# Patient Record
Sex: Male | Born: 1945
Health system: Southern US, Community
[De-identification: ages and names within clinical notes are randomized; demographics above are authoritative.]

## PROBLEM LIST (undated history)

## (undated) DIAGNOSIS — K219 Gastro-esophageal reflux disease without esophagitis: Secondary | ICD-10-CM

## (undated) DIAGNOSIS — Z972 Presence of dental prosthetic device (complete) (partial): Secondary | ICD-10-CM

## (undated) DIAGNOSIS — M109 Gout, unspecified: Secondary | ICD-10-CM

## (undated) DIAGNOSIS — G8929 Other chronic pain: Secondary | ICD-10-CM

## (undated) DIAGNOSIS — R519 Headache, unspecified: Secondary | ICD-10-CM

## (undated) DIAGNOSIS — E039 Hypothyroidism, unspecified: Secondary | ICD-10-CM

## (undated) DIAGNOSIS — E785 Hyperlipidemia, unspecified: Secondary | ICD-10-CM

## (undated) DIAGNOSIS — Z973 Presence of spectacles and contact lenses: Secondary | ICD-10-CM

## (undated) DIAGNOSIS — R319 Hematuria, unspecified: Secondary | ICD-10-CM

## (undated) DIAGNOSIS — R7303 Prediabetes: Secondary | ICD-10-CM

## (undated) DIAGNOSIS — K2970 Gastritis, unspecified, without bleeding: Secondary | ICD-10-CM

## (undated) DIAGNOSIS — C679 Malignant neoplasm of bladder, unspecified: Secondary | ICD-10-CM

## (undated) DIAGNOSIS — E559 Vitamin D deficiency, unspecified: Secondary | ICD-10-CM

## (undated) DIAGNOSIS — M1990 Unspecified osteoarthritis, unspecified site: Secondary | ICD-10-CM

## (undated) HISTORY — PX: OTHER SURGICAL HISTORY: SHX169

## (undated) HISTORY — PX: TONSILLECTOMY: SUR1361

---

## 2006-02-16 ENCOUNTER — Ambulatory Visit: Payer: Self-pay | Admitting: Nurse Practitioner

## 2006-02-19 ENCOUNTER — Ambulatory Visit: Payer: Self-pay | Admitting: *Deleted

## 2006-03-01 ENCOUNTER — Ambulatory Visit: Payer: Self-pay | Admitting: Nurse Practitioner

## 2006-10-19 ENCOUNTER — Ambulatory Visit: Payer: Self-pay | Admitting: Nurse Practitioner

## 2007-05-22 ENCOUNTER — Encounter (INDEPENDENT_AMBULATORY_CARE_PROVIDER_SITE_OTHER): Payer: Self-pay | Admitting: *Deleted

## 2007-08-13 ENCOUNTER — Ambulatory Visit: Payer: Self-pay | Admitting: Nurse Practitioner

## 2007-08-13 LAB — CONVERTED CEMR LAB
ALT: 32 units/L (ref 0–53)
AST: 29 units/L (ref 0–37)
Albumin: 4.8 g/dL (ref 3.5–5.2)
Alkaline Phosphatase: 61 units/L (ref 39–117)
BUN: 13 mg/dL (ref 6–23)
Basophils Absolute: 0 10*3/uL (ref 0.0–0.1)
Basophils Relative: 0 % (ref 0–1)
CO2: 22 meq/L (ref 19–32)
Calcium: 9.5 mg/dL (ref 8.4–10.5)
Chloride: 100 meq/L (ref 96–112)
Creatinine, Ser: 0.98 mg/dL (ref 0.40–1.50)
Eosinophils Absolute: 0.1 10*3/uL — ABNORMAL LOW (ref 0.2–0.7)
Eosinophils Relative: 1 % (ref 0–5)
Glucose, Bld: 97 mg/dL (ref 70–99)
HCT: 40.1 % (ref 39.0–52.0)
Hemoglobin: 13.9 g/dL (ref 13.0–17.0)
Lymphocytes Relative: 30 % (ref 12–46)
Lymphs Abs: 1.7 10*3/uL (ref 0.7–4.0)
MCHC: 34.7 g/dL (ref 30.0–36.0)
MCV: 95.2 fL (ref 78.0–100.0)
Monocytes Absolute: 0.9 10*3/uL (ref 0.1–1.0)
Monocytes Relative: 17 % — ABNORMAL HIGH (ref 3–12)
Neutro Abs: 2.9 10*3/uL (ref 1.7–7.7)
Neutrophils Relative %: 52 % (ref 43–77)
Platelets: 263 10*3/uL (ref 150–400)
Potassium: 4 meq/L (ref 3.5–5.3)
RBC: 4.21 M/uL — ABNORMAL LOW (ref 4.22–5.81)
RDW: 13 % (ref 11.5–15.5)
Sodium: 137 meq/L (ref 135–145)
TSH: 7.334 microintl units/mL — ABNORMAL HIGH (ref 0.350–5.50)
Total Bilirubin: 0.8 mg/dL (ref 0.3–1.2)
Total Protein: 7.7 g/dL (ref 6.0–8.3)
WBC: 5.6 10*3/uL (ref 4.0–10.5)

## 2007-11-12 ENCOUNTER — Ambulatory Visit: Payer: Self-pay | Admitting: Internal Medicine

## 2008-07-13 ENCOUNTER — Ambulatory Visit (HOSPITAL_COMMUNITY): Admission: RE | Admit: 2008-07-13 | Discharge: 2008-07-13 | Payer: Self-pay | Admitting: Internal Medicine

## 2008-07-13 ENCOUNTER — Encounter (INDEPENDENT_AMBULATORY_CARE_PROVIDER_SITE_OTHER): Payer: Self-pay | Admitting: Internal Medicine

## 2008-07-13 ENCOUNTER — Ambulatory Visit: Payer: Self-pay | Admitting: Internal Medicine

## 2008-07-13 LAB — CONVERTED CEMR LAB
ALT: 56 units/L — ABNORMAL HIGH (ref 0–53)
AST: 38 units/L — ABNORMAL HIGH (ref 0–37)
Albumin: 4.6 g/dL (ref 3.5–5.2)
Alkaline Phosphatase: 66 units/L (ref 39–117)
BUN: 19 mg/dL (ref 6–23)
Basophils Absolute: 0 10*3/uL (ref 0.0–0.1)
Basophils Relative: 0 % (ref 0–1)
CO2: 23 meq/L (ref 19–32)
Calcium: 9.5 mg/dL (ref 8.4–10.5)
Chloride: 100 meq/L (ref 96–112)
Cholesterol: 247 mg/dL — ABNORMAL HIGH (ref 0–200)
Creatinine, Ser: 0.98 mg/dL (ref 0.40–1.50)
Eosinophils Absolute: 0.2 10*3/uL (ref 0.0–0.7)
Eosinophils Relative: 2 % (ref 0–5)
Glucose, Bld: 93 mg/dL (ref 70–99)
HCT: 45.1 % (ref 39.0–52.0)
HDL: 42 mg/dL (ref >39)
Hemoglobin: 14.9 g/dL (ref 13.0–17.0)
LDL Cholesterol: 181 mg/dL — ABNORMAL HIGH (ref 0–99)
Lymphocytes Relative: 25 % (ref 12–46)
Lymphs Abs: 1.6 10*3/uL (ref 0.7–4.0)
MCHC: 33 g/dL (ref 30.0–36.0)
MCV: 93.2 fL (ref 78.0–100.0)
Monocytes Absolute: 1 10*3/uL (ref 0.1–1.0)
Monocytes Relative: 15 % — ABNORMAL HIGH (ref 3–12)
Neutro Abs: 3.9 10*3/uL (ref 1.7–7.7)
Neutrophils Relative %: 58 % (ref 43–77)
PSA: 0.87 ng/mL (ref 0.10–4.00)
Platelets: 314 10*3/uL (ref 150–400)
Potassium: 4.7 meq/L (ref 3.5–5.3)
RBC: 4.84 M/uL (ref 4.22–5.81)
RDW: 14 % (ref 11.5–15.5)
Sodium: 135 meq/L (ref 135–145)
Total Bilirubin: 0.5 mg/dL (ref 0.3–1.2)
Total CHOL/HDL Ratio: 5.9
Total Protein: 7.7 g/dL (ref 6.0–8.3)
Triglycerides: 119 mg/dL (ref <150)
VLDL: 24 mg/dL (ref 0–40)
WBC: 6.6 10*3/uL (ref 4.0–10.5)

## 2009-02-20 ENCOUNTER — Ambulatory Visit: Payer: Self-pay | Admitting: Family Medicine

## 2009-02-20 ENCOUNTER — Inpatient Hospital Stay (HOSPITAL_COMMUNITY): Admission: EM | Admit: 2009-02-20 | Discharge: 2009-02-21 | Payer: Self-pay | Admitting: Emergency Medicine

## 2009-02-22 ENCOUNTER — Ambulatory Visit: Payer: Self-pay | Admitting: Internal Medicine

## 2009-03-01 ENCOUNTER — Ambulatory Visit: Payer: Self-pay | Admitting: Internal Medicine

## 2009-03-01 ENCOUNTER — Ambulatory Visit: Payer: Self-pay | Admitting: Family Medicine

## 2009-03-15 ENCOUNTER — Telehealth (INDEPENDENT_AMBULATORY_CARE_PROVIDER_SITE_OTHER): Payer: Self-pay | Admitting: Radiology

## 2009-03-16 ENCOUNTER — Ambulatory Visit: Payer: Self-pay

## 2009-03-16 ENCOUNTER — Encounter: Payer: Self-pay | Admitting: Cardiovascular Disease

## 2009-04-07 ENCOUNTER — Encounter (INDEPENDENT_AMBULATORY_CARE_PROVIDER_SITE_OTHER): Payer: Self-pay | Admitting: Internal Medicine

## 2009-04-07 ENCOUNTER — Ambulatory Visit: Payer: Self-pay | Admitting: Family Medicine

## 2009-04-07 LAB — CONVERTED CEMR LAB
ALT: 60 units/L — ABNORMAL HIGH (ref 0–53)
AST: 26 units/L (ref 0–37)
Albumin: 4.5 g/dL (ref 3.5–5.2)
Alkaline Phosphatase: 78 units/L (ref 39–117)
Bilirubin, Direct: 0.1 mg/dL (ref 0.0–0.3)
Indirect Bilirubin: 0.3 mg/dL (ref 0.0–0.9)
Total Bilirubin: 0.4 mg/dL (ref 0.3–1.2)
Total Protein: 7.3 g/dL (ref 6.0–8.3)

## 2009-04-08 ENCOUNTER — Encounter (INDEPENDENT_AMBULATORY_CARE_PROVIDER_SITE_OTHER): Payer: Self-pay | Admitting: Internal Medicine

## 2009-04-08 LAB — CONVERTED CEMR LAB
HCV Ab: NEGATIVE
Hep A Total Ab: NEGATIVE
Hep B Core Total Ab: NEGATIVE
Hep B E Ab: NEGATIVE
Hep B S Ab: NEGATIVE
Hepatitis B Surface Ag: NEGATIVE

## 2009-04-15 ENCOUNTER — Ambulatory Visit: Payer: Self-pay | Admitting: Internal Medicine

## 2010-10-04 NOTE — Progress Notes (Signed)
Summary: Nuclear Pre-Procedure   Phone Note Outgoing Call Call back at Home Phone [phone]   Call placed by: Harlow Asa, CNMT,  March 15, 2009 5:28 PM Summary of Call: Reviewed information on Myoview Information Sheet (see scanned document for further details).  Spoke with patient     Nuclear Med Background Indications for Stress Test: Evaluation for Ischemia, Post Hospital  Indications Comments: 02-20-09 -CP (- enzymes)    Symptoms: Chest Pain, SOB  Symptoms Comments: shoulder tightness   Nuclear Pre-Procedure Cardiac Risk Factors: Family History - CAD

## 2010-10-04 NOTE — Assessment & Plan Note (Signed)
Summary: Cardiology Nuclear Study  Nuclear Med Background Indications for Stress Test: Evaluation for Ischemia, Post Hospital  Indications Comments: 02-20-09 -CP (- enzymes)    Symptoms: Chest Pain, Dizziness, DOE, Light-Headedness, Palpitations, SOB  Symptoms Comments: shoulder tightness   Nuclear Pre-Procedure Cardiac Risk Factors: Family History - CAD Caffeine/Decaff Intake: none NPO After: 6:30 AM Lungs: clear IV 0.9% NS with Angio Cath: 20g     IV Site: (R) AC IV Started by: Irean Hong RN Chest Size (in) 42     Height (in): 73 Weight (lb): 186 BMI: 24.63 Tech Comments: The patient complained of feeling lightheaded after IV stick.The patient was  placed in trendelenburg position,BP 90/60, HR54, O2 sat 96% RA.BP 70/50, HR56 after patient to sitting position with 0.9% NACL 250cc infused.The patient symptoms subsided. BP 94/70 HR 61 O2 sat 97%.  Nuclear Med Study 1 or 2 day study:  1 day     Stress Test Type:  Stress Reading MD:  Charlton Haws, MD     Referring MD:  kelly vollmer Resting Radionuclide:  Technetium 12m Tetrofosmin     Resting Radionuclide Dose:  10 mCi  Stress Radionuclide:  Technetium 4m Tetrofosmin     Stress Radionuclide Dose:  33 mCi   Stress Protocol Exercise Time (min):  7:00 min     Max HR:  139 bpm     Predicted Max HR: 159 bpm  Max Systolic BP: 133 mm Hg     % Max HR:  87 %     METS: 8.50 Rate Pressure Product:  64403    Stress Test Technologist:  Milana Na EMT-P     Nuclear Technologist:  Domenic Polite CNMT  Rest Procedure  Myocardial perfusion imaging was performed at rest 45 minutes following the intraveneous administration of Myoview Technetium 10m Tetrofosmin.  Stress Procedure  The patient exercised for 7:00  The patient stopped due to fatigue and denied any chest pain.  There were no significant ST-T wave changes.  Myoview was injected at peak exercise and myocardial perfusion imaging was performed after a brief delay.  QPS Raw  Data Images:  Normal; no motion artifact; normal heart/lung ratio. Stress Images:  NI: Uniform and normal uptake of tracer in all myocardial segments. Rest Images:  Normal homogeneous uptake in all areas of the myocardium. Subtraction (SDS):  Normal Transient Ischemic Dilatation:  1.05  (Normal <1.22)  Lung/Heart Ratio:  .25  (Normal <0.45)  Quantitative Gated Spect Images QGS EDV:  133 ml QGS ESV:  58 ml QGS EF:  57 % QGS cine images:  Normal  Findings Normal nuclear study      Overall Impression  Exercise Capacity: Fair exercise capacity. BP Response: Normal blood pressure response. Clinical Symptoms: No chest pain ECG Impression: No significant ST segment change suggestive of ischemia. Overall Impression: Normal stress nuclear study. Overall Impression Comments: Normal

## 2010-10-04 NOTE — Miscellaneous (Signed)
Summary: VIP  Patient: Lance Friedman Note: All result statuses are Final unless otherwise noted.  Tests: (1) VIP (Medications)   LLIMPORTMEDS              "Result Below..."       RESULT: PROTONIX TBEC 40 MG*TAKE ONE (1) TABLET BY MOUTH EVERY DAY  GENE OVER-INCOME 07/07*10/31/2006*Last Refill: 04/12/2007*[id number]*******   LLIMPORTMEDS              "Result Below..."       RESULT: CELEXA TABS 20 MG*TAKE TWO (2) TABLETS BY MOUTH DAILY   GENE OVER-INCOME 08/07*02/05/2007*Last Refill: 04/12/2007*[id number]*******   LLIMPORTALLS              NKDA***  Note: An exclamation mark (!) indicates a result that was not dispersed into the flowsheet. Document Creation Date: 07/04/2007 3:04 PM _______________________________________________________________________  (1) Order result status: Final Collection or observation date-time: 05/22/2007 Requested date-time: 05/22/2007 Receipt date-time:  Reported date-time: 05/22/2007 Referring Physician:   Ordering Physician:   Specimen Source:  Source: [facility] [place] Order Number:  Lab site:

## 2010-10-19 ENCOUNTER — Encounter (INDEPENDENT_AMBULATORY_CARE_PROVIDER_SITE_OTHER): Payer: Self-pay | Admitting: *Deleted

## 2010-10-19 LAB — CONVERTED CEMR LAB
ALT: 19 units/L (ref 0–53)
AST: 20 units/L (ref 0–37)
Albumin: 4.7 g/dL (ref 3.5–5.2)
Alkaline Phosphatase: 54 units/L (ref 39–117)
BUN: 14 mg/dL (ref 6–23)
CO2: 29 meq/L (ref 19–32)
Calcium: 9.7 mg/dL (ref 8.4–10.5)
Chloride: 104 meq/L (ref 96–112)
Cholesterol: 234 mg/dL — ABNORMAL HIGH (ref 0–200)
Creatinine, Ser: 0.92 mg/dL (ref 0.40–1.50)
Glucose, Bld: 94 mg/dL (ref 70–99)
HDL: 43 mg/dL (ref >39)
LDL Cholesterol: 176 mg/dL — ABNORMAL HIGH (ref 0–99)
PSA: 1.02 ng/mL (ref <=4.00)
Potassium: 5 meq/L (ref 3.5–5.3)
Sodium: 142 meq/L (ref 135–145)
Total Bilirubin: 0.5 mg/dL (ref 0.3–1.2)
Total CHOL/HDL Ratio: 5.4
Total Protein: 7 g/dL (ref 6.0–8.3)
Triglycerides: 77 mg/dL (ref <150)
VLDL: 15 mg/dL (ref 0–40)

## 2010-12-08 ENCOUNTER — Emergency Department (HOSPITAL_COMMUNITY)
Admission: EM | Admit: 2010-12-08 | Discharge: 2010-12-08 | Disposition: A | Discharge status: Home / Self Care | Payer: No Typology Code available for payment source | Attending: Emergency Medicine | Admitting: Emergency Medicine

## 2010-12-08 DIAGNOSIS — K219 Gastro-esophageal reflux disease without esophagitis: Secondary | ICD-10-CM | POA: Insufficient documentation

## 2010-12-08 DIAGNOSIS — R079 Chest pain, unspecified: Secondary | ICD-10-CM | POA: Insufficient documentation

## 2010-12-08 DIAGNOSIS — E7800 Pure hypercholesterolemia, unspecified: Secondary | ICD-10-CM | POA: Insufficient documentation

## 2010-12-08 DIAGNOSIS — IMO0002 Reserved for concepts with insufficient information to code with codable children: Secondary | ICD-10-CM | POA: Insufficient documentation

## 2010-12-12 LAB — CK TOTAL AND CKMB (NOT AT ARMC)
CK, MB: 1.7 ng/mL (ref 0.3–4.0)
Relative Index: 1.3 (ref 0.0–2.5)
Total CK: 134 U/L (ref 7–232)

## 2010-12-12 LAB — CARDIAC PANEL(CRET KIN+CKTOT+MB+TROPI)
CK, MB: 1 ng/mL (ref 0.3–4.0)
CK, MB: 1.3 ng/mL (ref 0.3–4.0)
CK, MB: 1.4 ng/mL (ref 0.3–4.0)
Relative Index: INVALID (ref 0.0–2.5)
Relative Index: INVALID (ref 0.0–2.5)
Relative Index: INVALID (ref 0.0–2.5)
Total CK: 85 U/L (ref 7–232)
Total CK: 90 U/L (ref 7–232)
Total CK: 92 U/L (ref 7–232)
Troponin I: 0.01 ng/mL (ref 0.00–0.06)
Troponin I: 0.01 ng/mL (ref 0.00–0.06)
Troponin I: 0.02 ng/mL (ref 0.00–0.06)

## 2010-12-12 LAB — COMPREHENSIVE METABOLIC PANEL
ALT: 71 U/L — ABNORMAL HIGH (ref 0–53)
AST: 106 U/L — ABNORMAL HIGH (ref 0–37)
Albumin: 3.7 g/dL (ref 3.5–5.2)
Alkaline Phosphatase: 73 U/L (ref 39–117)
BUN: 14 mg/dL (ref 6–23)
CO2: 25 mEq/L (ref 19–32)
Calcium: 9 mg/dL (ref 8.4–10.5)
Chloride: 102 mEq/L (ref 96–112)
Creatinine, Ser: 0.88 mg/dL (ref 0.4–1.5)
GFR calc Af Amer: >60 mL/min (ref >60)
GFR calc non Af Amer: >60 mL/min (ref >60)
Glucose, Bld: 130 mg/dL — ABNORMAL HIGH (ref 70–99)
Potassium: 3.9 mEq/L (ref 3.5–5.1)
Sodium: 135 mEq/L (ref 135–145)
Total Bilirubin: 0.4 mg/dL (ref 0.3–1.2)
Total Protein: 6.3 g/dL (ref 6.0–8.3)

## 2010-12-12 LAB — CBC
HCT: 37 % — ABNORMAL LOW (ref 39.0–52.0)
HCT: 38.8 % — ABNORMAL LOW (ref 39.0–52.0)
Hemoglobin: 12.5 g/dL — ABNORMAL LOW (ref 13.0–17.0)
Hemoglobin: 13 g/dL (ref 13.0–17.0)
MCHC: 33.5 g/dL (ref 30.0–36.0)
MCHC: 33.8 g/dL (ref 30.0–36.0)
MCV: 94.4 fL (ref 78.0–100.0)
MCV: 94.4 fL (ref 78.0–100.0)
Platelets: 239 10*3/uL (ref 150–400)
Platelets: 250 10*3/uL (ref 150–400)
RBC: 3.92 MIL/uL — ABNORMAL LOW (ref 4.22–5.81)
RBC: 4.11 MIL/uL — ABNORMAL LOW (ref 4.22–5.81)
RDW: 13.1 % (ref 11.5–15.5)
RDW: 13.6 % (ref 11.5–15.5)
WBC: 15.3 10*3/uL — ABNORMAL HIGH (ref 4.0–10.5)
WBC: 5.5 10*3/uL (ref 4.0–10.5)

## 2010-12-12 LAB — BASIC METABOLIC PANEL
BUN: 10 mg/dL (ref 6–23)
CO2: 29 mEq/L (ref 19–32)
Calcium: 8.7 mg/dL (ref 8.4–10.5)
Chloride: 101 mEq/L (ref 96–112)
Creatinine, Ser: 0.87 mg/dL (ref 0.4–1.5)
GFR calc Af Amer: >60 mL/min (ref >60)
GFR calc non Af Amer: >60 mL/min (ref >60)
Glucose, Bld: 99 mg/dL (ref 70–99)
Potassium: 4.1 mEq/L (ref 3.5–5.1)
Sodium: 135 mEq/L (ref 135–145)

## 2010-12-12 LAB — POCT CARDIAC MARKERS
CKMB, poc: <1 ng/mL — ABNORMAL LOW (ref 1.0–8.0)
CKMB, poc: <1 ng/mL — ABNORMAL LOW (ref 1.0–8.0)
Myoglobin, poc: 40.3 ng/mL (ref 12–200)
Myoglobin, poc: 48.3 ng/mL (ref 12–200)
Troponin i, poc: <0.05 ng/mL (ref 0.00–0.09)
Troponin i, poc: <0.05 ng/mL (ref 0.00–0.09)

## 2010-12-12 LAB — DIFFERENTIAL
Basophils Absolute: 0 10*3/uL (ref 0.0–0.1)
Basophils Relative: 0 % (ref 0–1)
Eosinophils Absolute: 0.1 10*3/uL (ref 0.0–0.7)
Eosinophils Relative: 1 % (ref 0–5)
Lymphocytes Relative: 6 % — ABNORMAL LOW (ref 12–46)
Lymphs Abs: 0.9 10*3/uL (ref 0.7–4.0)
Monocytes Absolute: 1.6 10*3/uL — ABNORMAL HIGH (ref 0.1–1.0)
Monocytes Relative: 10 % (ref 3–12)
Neutro Abs: 12.7 10*3/uL — ABNORMAL HIGH (ref 1.7–7.7)
Neutrophils Relative %: 83 % — ABNORMAL HIGH (ref 43–77)

## 2010-12-12 LAB — URINALYSIS, ROUTINE W REFLEX MICROSCOPIC
Bilirubin Urine: NEGATIVE
Glucose, UA: NEGATIVE mg/dL
Hgb urine dipstick: NEGATIVE
Ketones, ur: NEGATIVE mg/dL
Nitrite: NEGATIVE
Protein, ur: NEGATIVE mg/dL
Specific Gravity, Urine: 1.019 (ref 1.005–1.030)
Urobilinogen, UA: 0.2 mg/dL (ref 0.0–1.0)
pH: 6.5 (ref 5.0–8.0)

## 2010-12-12 LAB — RAPID URINE DRUG SCREEN, HOSP PERFORMED
Amphetamines: NOT DETECTED
Barbiturates: NOT DETECTED
Benzodiazepines: NOT DETECTED
Cocaine: NOT DETECTED
Opiates: NOT DETECTED
Tetrahydrocannabinol: NOT DETECTED

## 2010-12-12 LAB — LIPID PANEL
Cholesterol: 188 mg/dL (ref 0–200)
HDL: 33 mg/dL — ABNORMAL LOW (ref >39)
LDL Cholesterol: 138 mg/dL — ABNORMAL HIGH (ref 0–99)
Total CHOL/HDL Ratio: 5.7 RATIO
Triglycerides: 85 mg/dL (ref <150)
VLDL: 17 mg/dL (ref 0–40)

## 2010-12-12 LAB — LIPASE, BLOOD: Lipase: 35 U/L (ref 11–59)

## 2010-12-12 LAB — TROPONIN I: Troponin I: 0.01 ng/mL (ref 0.00–0.06)

## 2010-12-21 ENCOUNTER — Emergency Department (HOSPITAL_COMMUNITY)
Admission: EM | Admit: 2010-12-21 | Discharge: 2010-12-21 | Disposition: A | Discharge status: Home / Self Care | Payer: No Typology Code available for payment source | Attending: Emergency Medicine | Admitting: Emergency Medicine

## 2010-12-21 ENCOUNTER — Emergency Department (HOSPITAL_COMMUNITY): Payer: No Typology Code available for payment source

## 2010-12-21 DIAGNOSIS — F329 Major depressive disorder, single episode, unspecified: Secondary | ICD-10-CM | POA: Insufficient documentation

## 2010-12-21 DIAGNOSIS — R1011 Right upper quadrant pain: Secondary | ICD-10-CM | POA: Insufficient documentation

## 2010-12-21 DIAGNOSIS — R109 Unspecified abdominal pain: Secondary | ICD-10-CM | POA: Insufficient documentation

## 2010-12-21 DIAGNOSIS — F3289 Other specified depressive episodes: Secondary | ICD-10-CM | POA: Insufficient documentation

## 2010-12-21 DIAGNOSIS — K219 Gastro-esophageal reflux disease without esophagitis: Secondary | ICD-10-CM | POA: Insufficient documentation

## 2010-12-21 LAB — POCT I-STAT, CHEM 8
BUN: 15 mg/dL (ref 6–23)
Calcium, Ion: 1.23 mmol/L (ref 1.12–1.32)
Chloride: 103 mEq/L (ref 96–112)
Creatinine, Ser: 1 mg/dL (ref 0.4–1.5)
Glucose, Bld: 98 mg/dL (ref 70–99)
HCT: 43 % (ref 39.0–52.0)
Hemoglobin: 14.6 g/dL (ref 13.0–17.0)
Potassium: 4.1 mEq/L (ref 3.5–5.1)
Sodium: 141 mEq/L (ref 135–145)
TCO2: 30 mmol/L (ref 0–100)

## 2010-12-21 MED ORDER — IOHEXOL 300 MG/ML  SOLN
100.0000 mL | Freq: Once | INTRAMUSCULAR | Status: AC | PRN
  Administered 2010-12-21: 100 mL via INTRAVENOUS
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
## 2011-01-17 NOTE — Discharge Summary (Signed)
NAMECANNEN, DUPRAS               ACCOUNT NO.:  [id number]   MEDICAL RECORD NO.:  [id number]          PATIENT TYPE:  INP   LOCATION:  2020                         FACILITY:  [facility]   PHYSICIAN:  Nestor Ramp, MD        DATE OF BIRTH:  September 28, 1945   DATE OF ADMISSION:  02/20/2009  DATE OF DISCHARGE:  02/21/2009                               DISCHARGE SUMMARY   PRIMARY CARE Satya Buttram:  [facility].   DISCHARGE DIAGNOSES:  1. Chest pain.  2. Bradycardia.  3. History of alcohol use abuse.  4. Depression.  5. Anemia.  6. Gastroesophageal reflux disease.   DISCHARGE MEDICATIONS:  1. Protonix 40 mg by mouth daily.  2. Celexa 40 mg by mouth daily.  3. Naproxen 500 mg by mouth twice daily to take with food.  4. Aspirin 81 mg daily.   IMAGING:  Chest x-ray on February 20, 2009.  Impression:  Cephalization in  vascular pedicles in the lungs raising the possibility of pulmonary  venous hypertension.  No overt edema or cardiomegaly.   LABORATORY DATA:  Cardiac enzymes negative x3.  BMP within normal  limits.  Hemoglobin 12.5 and hematocrit 37.0.  Cholesterol 188,  triglycerides 85, HDL 33, and LDL 138.  Urine drug screen negative.  Lipase 35, AST 106, ALT 71, and alk phos 73.   BRIEF HOSPITAL COURSE:  Mr. Highley is a 65 year old male with a past  medical history significant for impression was admitted for chest pain  rule out.  Please see H and P for further details.  1. Chest pain.  The patient was admitted with atypical chest pain for      acute coronary syndrome, rule out.  He was monitored on telemetry      overnight.  He did have negative cardiac markers x3 and an EKG that      showed sinus bradycardia.  He had no more chest pain during his      stay.  He was started on aspirin 81 mg daily.  Given the fact that      the patient had a low risk of having an MI as well as being      slightly hypotensive and bradycardic, it was decided that no beta-      blocker will be started on this  patient.  He was risk stratified.      Please see lipid panel above.  It was thought that the patient's      symptoms were likely GI versus anxiety related.  He was continued      on his current GI medication of Protonix and his current depression      medication of Celexa.  2. As per cardiac, the patient was found to sinus bradycardia      consistently on EKG.  This was asymptomatic and we will recommend      workup on an outpatient basis for this.  His heart rate did remain      in the range of 50s-60s.  3. Alcohol abuse.  The patient did attend his alcohol anonymous  meetings while in the hospital.  His continued abstinence from      alcohol was encouraged.  4. Depression.  The patient's home medication of Celexa was continued.  5. Anemia.  The patient was found to be slightly anemic.  He denied      any melena, hematochezia, or hematemesis.  It was suggested to be      worked up on an outpatient basis.   FOLLOWUP:  The patient was instructed to follow up with his primary care  Illyana [facility]orsch at [facility] in 1-2 weeks.  Followup issues include:  1. Resolution for continuance of atypical chest pain.  2. Further risk stratification and starting of medication for      hyperlipidemia.  3. Further workup for anemia is indicated.      Helane Rima, MD  Electronically Signed      Nestor Ramp, MD  Electronically Signed    EW/MEDQ  D:  02/28/2009  T:  03/01/2009  Job:  [id number]

## 2012-08-06 ENCOUNTER — Emergency Department (HOSPITAL_COMMUNITY)
Admission: EM | Admit: 2012-08-06 | Discharge: 2012-08-06 | Disposition: A | Discharge status: Home / Self Care | Payer: PRIVATE HEALTH INSURANCE | Source: Home / Self Care

## 2012-08-06 ENCOUNTER — Encounter (HOSPITAL_COMMUNITY): Payer: Self-pay | Admitting: Emergency Medicine

## 2012-08-06 DIAGNOSIS — J209 Acute bronchitis, unspecified: Secondary | ICD-10-CM

## 2012-08-06 HISTORY — DX: Gastro-esophageal reflux disease without esophagitis: K21.9

## 2012-08-06 MED ORDER — GUAIFENESIN-DM 100-10 MG/5ML PO SYRP: 5.0000 mL | ORAL_SOLUTION | ORAL | Status: DC | PRN

## 2012-08-06 MED ORDER — AMOXICILLIN 500 MG PO CAPS: 500.0000 mg | ORAL_CAPSULE | Freq: Three times a day (TID) | ORAL | Status: DC

## 2012-08-06 NOTE — ED Notes (Signed)
Reports coughing for two weeks.  Has tried OTC medication but no relief.

## 2012-08-06 NOTE — ED Provider Notes (Signed)
History     CSN: [id number]  Arrival date & time 08/06/12  1247   None     Chief Complaint  Patient presents with  . Cough     HPI 66 year old male with history of GERD presenting with ongoing cough for past 2 weeks. The cough is associated with whitish phlegm. He has not noticed any blood in the sputum. He has tried over-the-counter medications including cough syrup without much relief. He denies any fevers or chills however has some chest discomfort with coughing. Denies any nausea or vomiting. Denies any nasal congestion sinus pain or sore throat. He denies any chest pain, palpitations, shortness of breath, wheezing, bowel or urinary symptoms. Denies any change in his weight or appetite. Denies any sick contacts. Denies any body aches.  Past Medical History  Diagnosis Date  . GERD (gastroesophageal reflux disease)     History reviewed. No pertinent past surgical history.  No family history on file.  History  Substance Use Topics  . Smoking status: Former Games developer  . Smokeless tobacco: Not on file  . Alcohol Use: No      Review of Systems Denies headache, blurry vision, nasal congestion, ear discharge. Denies sore throat, hoarseness of voice. Denies fever or chills. Denies difficulty swallowing. Persistent cough with whitish phlegm associated with some chest congestion. Denies chest pain, palpitations, shortness of breath, wheezing, Denies nausea, vomiting, abdominal pain, dysuria, bowel symptoms. Denies body aches, weakness in extremities or joints.  Allergies  Review of patient's allergies indicates no known allergies.  Home Medications   Current Outpatient Rx  Name  Route  Sig  Dispense  Refill  . OMEPRAZOLE 40 MG PO CPDR   Oral   Take 40 mg by mouth daily.         . AMOXICILLIN 500 MG PO CAPS   Oral   Take 1 capsule (500 mg total) by mouth 3 (three) times daily.   15 capsule   0     BP 117/87  Pulse 100  Temp 98.6 F (37 C) (Oral)  Resp 18  SpO2  100%  Physical Exam Elderly male in no acute distress. Coughing constantly during exam.  HEENT: No pallor, no icterus, moist oral mucosaNormal oropharynx Chest : clear to auscultation bilaterally no crackles wheezes or rhonchi CVS: Normal S1 and S2, no murmurs Abdomen: soft, nontender Extremities: Warm CNS: AAO x3  ED Course  Procedures (including critical care time)  Labs Reviewed - No data to display No results found.   1. Acute bronchitis    Follow give him a course of by mouth amoxicillin for 5 days. Encouraged on taking plenty of water and keeping warm. Patient does not have any fever or associated symptoms of pneumonia. I will prescribe him Robitussin for cough. Informed on returning back to the clinic or to the ED if symptoms do not improve in next 48-72 hours or get worse with associated fevers or chills.  #2 GERD Continue with omeprazole at home.  MDM          Eddie North, MD 08/06/12 1329

## 2012-08-13 ENCOUNTER — Emergency Department (HOSPITAL_COMMUNITY)
Admission: EM | Admit: 2012-08-13 | Discharge: 2012-08-13 | Disposition: A | Discharge status: Home / Self Care | Payer: PRIVATE HEALTH INSURANCE | Source: Home / Self Care

## 2012-08-13 ENCOUNTER — Encounter (HOSPITAL_COMMUNITY): Payer: Self-pay

## 2012-08-13 ENCOUNTER — Emergency Department (INDEPENDENT_AMBULATORY_CARE_PROVIDER_SITE_OTHER): Payer: PRIVATE HEALTH INSURANCE

## 2012-08-13 DIAGNOSIS — R9389 Abnormal findings on diagnostic imaging of other specified body structures: Secondary | ICD-10-CM

## 2012-08-13 DIAGNOSIS — R918 Other nonspecific abnormal finding of lung field: Secondary | ICD-10-CM

## 2012-08-13 DIAGNOSIS — R05 Cough: Secondary | ICD-10-CM

## 2012-08-13 DIAGNOSIS — J189 Pneumonia, unspecified organism: Secondary | ICD-10-CM

## 2012-08-13 DIAGNOSIS — R059 Cough, unspecified: Secondary | ICD-10-CM

## 2012-08-13 DIAGNOSIS — K219 Gastro-esophageal reflux disease without esophagitis: Secondary | ICD-10-CM

## 2012-08-13 MED ORDER — AMOXICILLIN-POT CLAVULANATE 875-125 MG PO TABS: 1.0000 | ORAL_TABLET | Freq: Two times a day (BID) | ORAL | Status: DC

## 2012-08-13 MED ORDER — ACIDOPHILUS PROBIOTIC 100 MG PO CAPS: 1.0000 | ORAL_CAPSULE | Freq: Three times a day (TID) | ORAL | Status: DC

## 2012-08-13 MED ORDER — IPRATROPIUM-ALBUTEROL 0.5-2.5 (3) MG/3ML IN SOLN
3.0000 mL | Freq: Once | RESPIRATORY_TRACT | Status: AC
  Administered 2012-08-13: 3 mL via RESPIRATORY_TRACT

## 2012-08-13 MED ORDER — ALBUTEROL SULFATE (5 MG/ML) 0.5% IN NEBU
INHALATION_SOLUTION | RESPIRATORY_TRACT | Status: AC
  Filled 2012-08-13: qty 1

## 2012-08-13 NOTE — ED Notes (Signed)
C/o cough congestion patient stated just finished antibiotic and symptoms have returned

## 2012-08-13 NOTE — ED Provider Notes (Signed)
History     CSN: [id number]  Arrival date & time 08/13/12  1050  Chief Complaint  Patient presents with  . Nasal Congestion   HPI Pt presents today to follow up.  He says that after he finished his course of antibiotics his symptoms of cough, and chills returned.  He is having no bloody sputum and minimal sputum production.  He is mostly concerned about waking up in the middle of the night drenched in sweat.  He says that he has had some SOB, but no CP.  No known fever but has had chills.  He has completed full 5 days of amoxicillin.    Past Medical History  Diagnosis Date  . GERD (gastroesophageal reflux disease)     History reviewed. No pertinent past surgical history.  No family history on file.  History  Substance Use Topics  . Smoking status: Former Games developer  . Smokeless tobacco: Not on file  . Alcohol Use: No    Review of Systems  Constitutional: Positive for chills, activity change, appetite change and fatigue. Negative for fever and unexpected weight change.  HENT: Positive for congestion, rhinorrhea and postnasal drip. Negative for sneezing.   Eyes: Negative.   Respiratory: Positive for cough and shortness of breath. Negative for apnea, choking, chest tightness, wheezing and stridor.   Cardiovascular: Negative.   Gastrointestinal: Negative.   Genitourinary: Negative.   Musculoskeletal: Negative.   Neurological: Positive for dizziness. Negative for facial asymmetry, weakness, light-headedness, numbness and headaches.  Psychiatric/Behavioral: Negative.     Allergies  Review of patient's allergies indicates no known allergies.  Home Medications   Current Outpatient Rx  Name  Route  Sig  Dispense  Refill  . AMOXICILLIN-POT CLAVULANATE 875-125 MG PO TABS   Oral   Take 1 tablet by mouth 2 (two) times daily.   14 tablet   0   . GUAIFENESIN-DM 100-10 MG/5ML PO SYRP   Oral   Take 5 mLs by mouth every 4 (four) hours as needed for cough.   118 mL   0   .  ACIDOPHILUS PROBIOTIC 100 MG PO CAPS   Oral   Take 1 capsule (100 mg total) by mouth 3 (three) times daily with meals.   90 capsule   0   . OMEPRAZOLE 40 MG PO CPDR   Oral   Take 40 mg by mouth daily.           BP 104/67  Pulse 94  Temp 98 F (36.7 C) (Oral)  Resp 19  SpO2 99%  Physical Exam  Constitutional: He is oriented to person, place, and time. He appears well-developed and well-nourished. No distress.  HENT:  Head: Normocephalic and atraumatic.  Eyes: EOM are normal. Pupils are equal, round, and reactive to light.  Neck: Normal range of motion. Neck supple.  Cardiovascular: Normal rate, regular rhythm and normal heart sounds.   Pulmonary/Chest: Effort normal and breath sounds normal.  Abdominal: Soft. Bowel sounds are normal.  Musculoskeletal: Normal range of motion. He exhibits no edema and no tenderness.  Neurological: He is alert and oriented to person, place, and time.  Skin: Skin is warm and dry.    ED Course  Procedures (including critical care time)  Labs Reviewed - No data to display No results found.  1. Abnormal CXR   2. Cough   3. Community acquired pneumonia   4. GERD (gastroesophageal reflux disease)    MDM  IMPRESSION  Cough  Abnormal Chest Xray  Community Acquired  Pneumonia  RECOMMENDATIONS / PLAN I STRONGLY ADVISED PATIENT TO RETURN TO CLINIC IN 1 MONTH FOR RECHECK CXR.  I REVIEWED THE RESULTS OF THE CXR WITH THE PATIENT.  HE VERBALIZED UNDERSTANDING. I wrote for him to start Augmentin bid, but patient returned and said that he could not afford the medication, then we switched the medication to amoxicillin 500mg  to take 2 tabs po bid for 7 days, I strongly advised him to RTC if no improvement in next 24-48 hours.  The patient verbalized understanding.  I asked him to RTC in 1 week for recheck.  Refilled omeprazole 40 mg daily.   Pt received Duoneb treatment in the clinic and improved after the treatment with better air movement and much  less SOB  FOLLOW UP 1 week for recheck follow up Pneumonia and abnormal CXR  The patient was given clear instructions to go to ER or return to medical center if symptoms don't improve, worsen or new problems develop.  The patient verbalized understanding.  The patient was told to call to get lab results if they haven't heard anything in the next week.            Cleora Fleet, MD 08/13/12 1511

## 2013-09-04 HISTORY — PX: MOUTH SURGERY: SHX715

## 2016-01-21 ENCOUNTER — Other Ambulatory Visit: Payer: Self-pay | Admitting: Gastroenterology

## 2016-03-20 ENCOUNTER — Encounter (HOSPITAL_COMMUNITY): Payer: Self-pay | Admitting: *Deleted

## 2016-03-21 ENCOUNTER — Encounter (HOSPITAL_COMMUNITY)
Admission: RE | Disposition: A | Discharge status: Home / Self Care | Payer: Self-pay | Source: Ambulatory Visit | Attending: Gastroenterology

## 2016-03-21 ENCOUNTER — Encounter (HOSPITAL_COMMUNITY): Payer: Self-pay | Admitting: *Deleted

## 2016-03-21 ENCOUNTER — Ambulatory Visit (HOSPITAL_COMMUNITY): Payer: Medicare Other | Admitting: Certified Registered Nurse Anesthetist

## 2016-03-21 ENCOUNTER — Ambulatory Visit (HOSPITAL_COMMUNITY)
Admission: RE | Admit: 2016-03-21 | Discharge: 2016-03-21 | Disposition: A | Discharge status: Home / Self Care | Payer: Medicare Other | Source: Ambulatory Visit | Attending: Gastroenterology | Admitting: Gastroenterology

## 2016-03-21 DIAGNOSIS — Z79899 Other long term (current) drug therapy: Secondary | ICD-10-CM | POA: Insufficient documentation

## 2016-03-21 DIAGNOSIS — Z87891 Personal history of nicotine dependence: Secondary | ICD-10-CM | POA: Insufficient documentation

## 2016-03-21 DIAGNOSIS — Z1211 Encounter for screening for malignant neoplasm of colon: Secondary | ICD-10-CM | POA: Insufficient documentation

## 2016-03-21 DIAGNOSIS — K219 Gastro-esophageal reflux disease without esophagitis: Secondary | ICD-10-CM | POA: Insufficient documentation

## 2016-03-21 DIAGNOSIS — J449 Chronic obstructive pulmonary disease, unspecified: Secondary | ICD-10-CM | POA: Diagnosis not present

## 2016-03-21 HISTORY — PX: COLONOSCOPY WITH PROPOFOL: SHX5780

## 2016-03-21 SURGERY — COLONOSCOPY WITH PROPOFOL
Anesthesia: Monitor Anesthesia Care

## 2016-03-21 MED ORDER — LIDOCAINE 2% (20 MG/ML) 5 ML SYRINGE
INTRAMUSCULAR | Status: DC | PRN
  Administered 2016-03-21: 50 mg via INTRAVENOUS

## 2016-03-21 MED ORDER — PROPOFOL 10 MG/ML IV BOLUS
INTRAVENOUS | Status: AC
  Filled 2016-03-21: qty 20

## 2016-03-21 MED ORDER — SODIUM CHLORIDE 0.9 % IV SOLN: INTRAVENOUS | Status: DC

## 2016-03-21 MED ORDER — ONDANSETRON HCL 4 MG/2ML IJ SOLN
INTRAMUSCULAR | Status: DC | PRN
  Administered 2016-03-21: 4 mg via INTRAVENOUS

## 2016-03-21 MED ORDER — PROPOFOL 10 MG/ML IV BOLUS
INTRAVENOUS | Status: AC
  Filled 2016-03-21: qty 40

## 2016-03-21 MED ORDER — LACTATED RINGERS IV SOLN
INTRAVENOUS | Status: DC
  Administered 2016-03-21: 09:00:00 via INTRAVENOUS

## 2016-03-21 MED ORDER — LIDOCAINE HCL (CARDIAC) 20 MG/ML IV SOLN
INTRAVENOUS | Status: AC
  Filled 2016-03-21: qty 5

## 2016-03-21 MED ORDER — PROPOFOL 500 MG/50ML IV EMUL
INTRAVENOUS | Status: DC | PRN
Start: 2016-03-21 — End: 2016-03-21
  Administered 2016-03-21: 75 ug/kg/min via INTRAVENOUS

## 2016-03-21 MED ORDER — ONDANSETRON HCL 4 MG/2ML IJ SOLN
INTRAMUSCULAR | Status: AC
  Filled 2016-03-21: qty 2

## 2016-03-21 SURGICAL SUPPLY — 21 items

## 2016-03-21 NOTE — Discharge Instructions (Signed)
Colonoscopy, Care After °Refer to this sheet in the next few weeks. These instructions provide you with information on caring for yourself after your procedure. Your health care provider may also give you more specific instructions. Your treatment has been planned according to current medical practices, but problems sometimes occur. Call your health care provider if you have any problems or questions after your procedure. °WHAT TO EXPECT AFTER THE PROCEDURE  °After your procedure, it is typical to have the following: °· A small amount of blood in your stool. °· Moderate amounts of gas and mild abdominal cramping or bloating. °HOME CARE INSTRUCTIONS °· Do not drive, operate machinery, or sign important documents for 24 hours. °· You may shower and resume your regular physical activities, but move at a slower pace for the first 24 hours. °· Take frequent rest periods for the first 24 hours. °· Walk around or put a warm pack on your abdomen to help reduce abdominal cramping and bloating. °· Drink enough fluids to keep your urine clear or pale yellow. °· You may resume your normal diet as instructed by your health care provider. Avoid heavy or fried foods that are hard to digest. °· Avoid drinking alcohol for 24 hours or as instructed by your health care provider. °· Only take over-the-counter or prescription medicines as directed by your health care provider. °· If a tissue sample (biopsy) was taken during your procedure: °¨ Do not take aspirin or blood thinners for 7 days, or as instructed by your health care provider. °¨ Do not drink alcohol for 7 days, or as instructed by your health care provider. °¨ Eat soft foods for the first 24 hours. °SEEK MEDICAL CARE IF: °You have persistent spotting of blood in your stool 2-3 days after the procedure. °SEEK IMMEDIATE MEDICAL CARE IF: °· You have more than a small spotting of blood in your stool. °· You pass large blood clots in your stool. °· Your abdomen is swollen  (distended). °· You have nausea or vomiting. °· You have a fever. °· You have increasing abdominal pain that is not relieved with medicine. °  °This information is not intended to replace advice given to you by your health care provider. Make sure you discuss any questions you have with your health care provider. °  °Document Released: 04/04/2004 Document Revised: 06/11/2013 Document Reviewed: 04/28/2013 °Elsevier Interactive Patient Education ©2016 [employer] ° °

## 2016-03-21 NOTE — Transfer of Care (Signed)
Immediate Anesthesia Transfer of Care Note  Patient: Lance Friedman  Procedure(s) Performed: Procedure(s): COLONOSCOPY WITH PROPOFOL (N/A)  Patient Location: PACU  Anesthesia Type:MAC  Level of Consciousness:  sedated, patient cooperative and responds to stimulation  Airway & Oxygen Therapy:Patient Spontanous Breathing and Patient connected to face mask oxgen  Post-op Assessment:  Report given to PACU RN and Post -op Vital signs reviewed and stable  Post vital signs:  Reviewed and stable  Last Vitals:  Filed Vitals:   03/21/16 0902  BP: 130/80  Pulse: 64  Temp: 36.7 C  Resp: 11    Complications: No apparent anesthesia complications

## 2016-03-21 NOTE — Anesthesia Postprocedure Evaluation (Signed)
Anesthesia Post Note  Patient: Lance Friedman  Procedure(s) Performed: Procedure(s) (LRB): COLONOSCOPY WITH PROPOFOL (N/A)  Patient location during evaluation: Endoscopy Anesthesia Type: MAC Level of consciousness: awake and alert, patient cooperative and oriented Pain management: pain level controlled Vital Signs Assessment: post-procedure vital signs reviewed and stable Respiratory status: spontaneous breathing, nonlabored ventilation and respiratory function stable Cardiovascular status: blood pressure returned to baseline and stable Postop Assessment: no signs of nausea or vomiting Anesthetic complications: no    Last Vitals:  Filed Vitals:   03/21/16 0902 03/21/16 1115  BP: 130/80 106/72  Pulse: 64 60  Temp: 36.7 C 36.5 C  Resp: 11 10    Last Pain: There were no vitals filed for this visit.               Midge Minium

## 2016-03-21 NOTE — H&P (Signed)
  Procedure: Baseline screening colonoscopy. No family history of colon cancer.  History: The patient is a 70 year old male born [birth year]. He is scheduled to undergo his first screening colonoscopy with polypectomy to prevent colon cancer.  Past medical history: Osteoarthritis. Chronic neck pain.    Exam: Patient is alert and lying comfortably on the endoscopy stretcher. Abdomen is soft and nontender to palpation. Lungs are clear to auscultation. Cardiac exam reveals a regular rhythm  Plan: Proceed with screening colonoscopy

## 2016-03-21 NOTE — Op Note (Signed)
[facility] Patient Name: Lance Friedman Procedure Date: 03/21/2016 MRN: [id number] Attending MD: Garlan Fair , MD Date of Birth: 05-Feb-1946 CSN: [id number] Age: 70 Admit Type: Outpatient Procedure:                Colonoscopy Indications:              Screening for colorectal malignant neoplasm Providers:                Garlan Fair, MD, Cleda Daub, RN, Corliss Parish, Technician Referring MD:              Medicines:                Propofol per Anesthesia Complications:            No immediate complications. Estimated Blood Loss:     Estimated blood loss: none. Procedure:                Pre-Anesthesia Assessment:                           - Prior to the procedure, a History and Physical                            was performed, and patient medications and                            allergies were reviewed. The patient's tolerance of                            previous anesthesia was also reviewed. The risks                            and benefits of the procedure and the sedation                            options and risks were discussed with the patient.                            All questions were answered, and informed consent                            was obtained. Prior Anticoagulants: The patient has                            taken aspirin, last dose was 1 day prior to                            procedure. ASA Grade Assessment: II - A patient                            with mild systemic disease. After reviewing the  risks and benefits, the patient was deemed in                            satisfactory condition to undergo the procedure.                           After obtaining informed consent, the colonoscope                            was passed under direct vision. Throughout the                            procedure, the patient's blood pressure, pulse, and   oxygen saturations were monitored continuously. The                            [id number] scope was introduced through                            the anus and advanced to the the cecum, identified                            by appendiceal orifice and ileocecal valve. The                            colonoscopy was technically difficult and complex                            due to significant looping. The patient tolerated                            the procedure well. The quality of the bowel                            preparation was good. The appendiceal orifice and                            the rectum were photographed. Scope In: 10:31:31 AM Scope Out: 11:08:25 AM Scope Withdrawal Time: 0 hours 17 minutes 54 seconds  Total Procedure Duration: 0 hours 36 minutes 54 seconds  Findings:      The perianal and digital rectal examinations were normal.      The entire examined colon appeared normal. Impression:               - The entire examined colon is normal.                           - No specimens collected. Moderate Sedation:      N/A- Per Anesthesia Care Recommendation:           - Patient has a contact number available for                            emergencies. The signs and symptoms of potential  delayed complications were discussed with the                            patient. Return to normal activities tomorrow.                            Written discharge instructions were provided to the                            patient.                           - Repeat colonoscopy in 10 years for screening                            purposes.                           - Resume previous diet.                           - Continue present medications. Procedure Code(s):        --- Professional ---                           [phone], Colonoscopy, flexible; diagnostic, including                            collection of specimen(s) by brushing or washing,                             when performed (separate procedure) Diagnosis Code(s):        --- Professional ---                           Z12.11, Encounter for screening for malignant                            neoplasm of colon CPT copyright 2016 [employer]. All rights reserved. The codes documented in this report are preliminary and upon coder review may  be revised to meet current compliance requirements. Earle Gell, MD Garlan Fair, MD 03/21/2016 11:12:52 AM This report has been signed electronically. Number of Addenda: 0

## 2016-03-21 NOTE — Anesthesia Procedure Notes (Signed)
Procedure Name: MAC Date/Time: 03/21/2016 10:26 AM Performed by: West Pugh Pre-anesthesia Checklist: Patient identified, Timeout performed, Emergency Drugs available, Suction available and Patient being monitored Patient Re-evaluated:Patient Re-evaluated prior to inductionOxygen Delivery Method: Simple face mask Dental Injury: Teeth and Oropharynx as per pre-operative assessment

## 2016-03-21 NOTE — Anesthesia Preprocedure Evaluation (Signed)
Anesthesia Evaluation  Patient identified by MRN, date of birth, ID band Patient awake    Reviewed: Allergy & Precautions, NPO status , Patient's Chart, lab work & pertinent test results  History of Anesthesia Complications Negative for: history of anesthetic complications  Airway Mallampati: II  TM Distance: >3 FB Neck ROM: Full    Dental  (+) Edentulous Upper, Edentulous Lower   Pulmonary COPD, former smoker,    breath sounds clear to auscultation       Cardiovascular negative cardio ROS   Rhythm:Regular Rate:Normal     Neuro/Psych negative neurological ROS     GI/Hepatic GERD  ,(+)     substance abuse  alcohol use,   Endo/Other  negative endocrine ROS  Renal/GU      Musculoskeletal   Abdominal   Peds  Hematology negative hematology ROS (+)   Anesthesia Other Findings   Reproductive/Obstetrics                             Anesthesia Physical Anesthesia Plan  ASA: III  Anesthesia Plan: MAC   Post-op Pain Management:    Induction: Intravenous  Airway Management Planned: Natural Airway  Additional Equipment:   Intra-op Plan:   Post-operative Plan:   Informed Consent: I have reviewed the patients History and Physical, chart, labs and discussed the procedure including the risks, benefits and alternatives for the proposed anesthesia with the patient or authorized representative who has indicated his/her understanding and acceptance.     Plan Discussed with: CRNA and Surgeon  Anesthesia Plan Comments: (Plan routine monitors, MAC)        Anesthesia Quick Evaluation

## 2016-03-22 ENCOUNTER — Encounter (HOSPITAL_COMMUNITY): Payer: Self-pay | Admitting: Gastroenterology

## 2019-10-27 ENCOUNTER — Ambulatory Visit: Payer: Medicare Other | Attending: Internal Medicine

## 2019-10-27 DIAGNOSIS — Z20822 Contact with and (suspected) exposure to covid-19: Secondary | ICD-10-CM

## 2019-10-28 LAB — NOVEL CORONAVIRUS, NAA: SARS-CoV-2, NAA: NOT DETECTED

## 2020-09-15 DIAGNOSIS — E785 Hyperlipidemia, unspecified: Secondary | ICD-10-CM | POA: Diagnosis not present

## 2020-09-15 DIAGNOSIS — K219 Gastro-esophageal reflux disease without esophagitis: Secondary | ICD-10-CM | POA: Diagnosis not present

## 2020-09-15 DIAGNOSIS — E039 Hypothyroidism, unspecified: Secondary | ICD-10-CM | POA: Diagnosis not present

## 2020-09-15 DIAGNOSIS — M1990 Unspecified osteoarthritis, unspecified site: Secondary | ICD-10-CM | POA: Diagnosis not present

## 2020-09-27 DIAGNOSIS — K2970 Gastritis, unspecified, without bleeding: Secondary | ICD-10-CM | POA: Diagnosis not present

## 2020-09-27 DIAGNOSIS — R7309 Other abnormal glucose: Secondary | ICD-10-CM | POA: Diagnosis not present

## 2020-09-27 DIAGNOSIS — E559 Vitamin D deficiency, unspecified: Secondary | ICD-10-CM | POA: Diagnosis not present

## 2020-09-27 DIAGNOSIS — Z1211 Encounter for screening for malignant neoplasm of colon: Secondary | ICD-10-CM | POA: Diagnosis not present

## 2020-09-27 DIAGNOSIS — M10441 Other secondary gout, right hand: Secondary | ICD-10-CM | POA: Diagnosis not present

## 2020-09-27 DIAGNOSIS — E785 Hyperlipidemia, unspecified: Secondary | ICD-10-CM | POA: Diagnosis not present

## 2020-09-27 DIAGNOSIS — E039 Hypothyroidism, unspecified: Secondary | ICD-10-CM | POA: Diagnosis not present

## 2020-09-27 DIAGNOSIS — Z0000 Encounter for general adult medical examination without abnormal findings: Secondary | ICD-10-CM | POA: Diagnosis not present

## 2020-09-27 DIAGNOSIS — M542 Cervicalgia: Secondary | ICD-10-CM | POA: Diagnosis not present

## 2020-10-13 DIAGNOSIS — R1031 Right lower quadrant pain: Secondary | ICD-10-CM | POA: Diagnosis not present

## 2020-10-13 DIAGNOSIS — R319 Hematuria, unspecified: Secondary | ICD-10-CM | POA: Diagnosis not present

## 2020-10-22 DIAGNOSIS — K219 Gastro-esophageal reflux disease without esophagitis: Secondary | ICD-10-CM | POA: Diagnosis not present

## 2020-10-22 DIAGNOSIS — E785 Hyperlipidemia, unspecified: Secondary | ICD-10-CM | POA: Diagnosis not present

## 2020-10-22 DIAGNOSIS — E039 Hypothyroidism, unspecified: Secondary | ICD-10-CM | POA: Diagnosis not present

## 2020-10-22 DIAGNOSIS — M1990 Unspecified osteoarthritis, unspecified site: Secondary | ICD-10-CM | POA: Diagnosis not present

## 2020-10-25 DIAGNOSIS — R3129 Other microscopic hematuria: Secondary | ICD-10-CM | POA: Diagnosis not present

## 2020-10-27 DIAGNOSIS — R3129 Other microscopic hematuria: Secondary | ICD-10-CM | POA: Diagnosis not present

## 2020-11-05 DIAGNOSIS — M545 Low back pain, unspecified: Secondary | ICD-10-CM | POA: Diagnosis not present

## 2020-11-12 DIAGNOSIS — R310 Gross hematuria: Secondary | ICD-10-CM | POA: Diagnosis not present

## 2020-11-25 DIAGNOSIS — N1330 Unspecified hydronephrosis: Secondary | ICD-10-CM | POA: Diagnosis not present

## 2020-11-25 DIAGNOSIS — K8020 Calculus of gallbladder without cholecystitis without obstruction: Secondary | ICD-10-CM | POA: Diagnosis not present

## 2020-11-25 DIAGNOSIS — K2970 Gastritis, unspecified, without bleeding: Secondary | ICD-10-CM | POA: Diagnosis not present

## 2020-11-25 DIAGNOSIS — I723 Aneurysm of iliac artery: Secondary | ICD-10-CM | POA: Diagnosis not present

## 2020-11-25 DIAGNOSIS — R310 Gross hematuria: Secondary | ICD-10-CM | POA: Diagnosis not present

## 2020-11-30 DIAGNOSIS — N3289 Other specified disorders of bladder: Secondary | ICD-10-CM | POA: Diagnosis not present

## 2020-11-30 DIAGNOSIS — M545 Low back pain, unspecified: Secondary | ICD-10-CM | POA: Diagnosis not present

## 2020-12-02 DIAGNOSIS — R310 Gross hematuria: Secondary | ICD-10-CM | POA: Diagnosis not present

## 2020-12-02 DIAGNOSIS — D414 Neoplasm of uncertain behavior of bladder: Secondary | ICD-10-CM | POA: Diagnosis not present

## 2020-12-06 ENCOUNTER — Other Ambulatory Visit: Payer: Self-pay | Admitting: Urology

## 2020-12-13 ENCOUNTER — Other Ambulatory Visit: Payer: Self-pay

## 2020-12-13 ENCOUNTER — Encounter (HOSPITAL_BASED_OUTPATIENT_CLINIC_OR_DEPARTMENT_OTHER): Payer: Self-pay | Admitting: Urology

## 2020-12-13 NOTE — Progress Notes (Signed)
Spoke w/ via phone for pre-op interview---pt Lab needs dos---- none              Lab results------none COVID test ------12-20-2020 800 am Arrive at -------645 am 12-21-2020 NPO after MN NO Solid Food.  Clear liquids from MN until---545 am then npo Med rec completed Medications to take morning of surgery ----omeprazole, levothyroxine- Diabetic medication -----n/a Patient instructed to bring photo id and insurance card day of surgery Patient aware to have Driver (ride ) / caregiver  Will arrange driver/caregiver granddaughter Lance Friedman   for 24 hours after surgery  Patient Special Instructions -----none Pre-Op special Istructions -----none Patient verbalized understanding of instructions that were given at this phone interview. Patient denies shortness of breath, chest pain, fever, cough at this phone interview.

## 2020-12-16 DIAGNOSIS — M1990 Unspecified osteoarthritis, unspecified site: Secondary | ICD-10-CM | POA: Diagnosis not present

## 2020-12-16 DIAGNOSIS — K219 Gastro-esophageal reflux disease without esophagitis: Secondary | ICD-10-CM | POA: Diagnosis not present

## 2020-12-16 DIAGNOSIS — E785 Hyperlipidemia, unspecified: Secondary | ICD-10-CM | POA: Diagnosis not present

## 2020-12-16 DIAGNOSIS — E039 Hypothyroidism, unspecified: Secondary | ICD-10-CM | POA: Diagnosis not present

## 2020-12-20 ENCOUNTER — Other Ambulatory Visit (HOSPITAL_COMMUNITY)
Admission: RE | Admit: 2020-12-20 | Discharge: 2020-12-20 | Disposition: A | Discharge status: Home / Self Care | Payer: Medicare Other | Source: Ambulatory Visit | Attending: Urology | Admitting: Urology

## 2020-12-20 DIAGNOSIS — Z20822 Contact with and (suspected) exposure to covid-19: Secondary | ICD-10-CM | POA: Diagnosis not present

## 2020-12-20 DIAGNOSIS — Z01812 Encounter for preprocedural laboratory examination: Secondary | ICD-10-CM | POA: Insufficient documentation

## 2020-12-20 LAB — SARS CORONAVIRUS 2 (TAT 6-24 HRS): SARS Coronavirus 2: NEGATIVE

## 2020-12-20 NOTE — Anesthesia Preprocedure Evaluation (Addendum)
Anesthesia Evaluation  Patient identified by MRN, date of birth, ID band Patient awake    Reviewed: Allergy & Precautions, NPO status , Patient's Chart, lab work & pertinent test results  Airway Mallampati: II  TM Distance: >3 FB Neck ROM: Full    Dental  (+) Upper Dentures, Lower Dentures   Pulmonary neg pulmonary ROS, former smoker,    Pulmonary exam normal        Cardiovascular negative cardio ROS   Rhythm:Regular Rate:Normal     Neuro/Psych  Headaches, negative psych ROS   GI/Hepatic Neg liver ROS, GERD  ,  Endo/Other  Hypothyroidism   Renal/GU negative Renal ROS Bladder dysfunction  Bladder Ca    Musculoskeletal  (+) Arthritis , Osteoarthritis,    Abdominal (+)  Abdomen: soft. Bowel sounds: normal.  Peds  Hematology negative hematology ROS (+)   Anesthesia Other Findings   Reproductive/Obstetrics                            Anesthesia Physical Anesthesia Plan  ASA: II  Anesthesia Plan: General   Post-op Pain Management:    Induction: Intravenous  PONV Risk Score and Plan: 2 and Ondansetron, Dexamethasone and Treatment may vary due to age or medical condition  Airway Management Planned: Mask and LMA  Additional Equipment: None  Intra-op Plan:   Post-operative Plan: Extubation in OR  Informed Consent: I have reviewed the patients History and Physical, chart, labs and discussed the procedure including the risks, benefits and alternatives for the proposed anesthesia with the patient or authorized representative who has indicated his/her understanding and acceptance.     Dental advisory given  Plan Discussed with: CRNA  Anesthesia Plan Comments: (Lab Results      Component                Value               Date                      WBC                      5.5                 02/21/2009                HGB                      14.6                12/21/2010                 HCT                      43.0                12/21/2010                MCV                      94.4                02/21/2009                PLT                      239  02/21/2009           Lab Results      Component                Value               Date                      NA                       141                 12/21/2010                K                        4.1                 12/21/2010                CO2                      29                  10/19/2010                GLUCOSE                  98                  12/21/2010                BUN                      15                  12/21/2010                CREATININE               1.0                 12/21/2010                CALCIUM                  9.7                 10/19/2010                GFRNONAA                 >60                 02/21/2009                GFRAA                                        02/21/2009            >60        The eGFR has been calculated using the MDRD equation. This calculation has not been validated in all clinical situations. eGFR's persistently <60 mL/min signify possible Chronic Kidney Disease.)       Anesthesia Quick Evaluation

## 2020-12-20 NOTE — H&P (Signed)
Patient is a 75 year old white male seen today for evaluation of hematuria. Patient originally seen for annual physical in January by his PCP and noted to have micro hematuria. He was having no gross hematuria at that time. In follow-up he was treated with an antibiotic but apparently did not show anything on the culture by his report. A renal ultrasound was ordered and showed a questionable bladder mass by report. He since has developed 1 episode of painless gross hematuria. He is a smoker 1-2 pack per day for 10 years intermittently. He quit 1985. No prior history of stones.  Micro urinalysis today shows 10-20 RBCs.  -12/02/20-patient with history of gross hematuria as above. Had CT renal scan on 11/26/2020 which may show bladder mass. See report below. Here for cysto to assess bladder.  Cysto performed today and shows: Exophytic tumor mass in the right lateral floor of the bladder. Difficult to tell whether this impinged on the right ureteral orifice. Size is probably 2-3 cm.   CLINICAL DATA: Gross hematuria starting 2 weeks ago lasting for 2  days. Persistent microscopic hematuria. Urinary frequency. Remote  smoking history.   EXAM:  CT ABDOMEN AND PELVIS WITHOUT AND WITH CONTRAST   TECHNIQUE:  Multidetector CT imaging of the abdomen and pelvis was performed  following the standard protocol before and following the bolus  administration of intravenous contrast.   CONTRAST: 125 cc of Omnipaque 300   COMPARISON: 12/21/2010 from [facility]   FINDINGS:  Lower chest: Clear lung bases. Normal heart size without pericardial  or pleural effusion. Left circumflex coronary artery calcification.   Hepatobiliary: Normal liver. Multiple tiny gallstones without acute  cholecystitis or biliary duct dilatation.   Pancreas: Normal, without mass or ductal dilatation.   Spleen: Normal in size, without focal abnormality.   Adrenals/Urinary Tract: Normal adrenal glands. No renal or ureteric  calculi.    No suspicious renal mass on post-contrast imaging. A too small to  characterize interpolar left renal lesion of approximately 6 mm.   Good renal collecting system opacification on delayed images.  Portions of the distal left and the majority of the right ureter are  not well opacified.   Moderate right-sided hydroureteronephrosis. This continues to the  level of an enhancing right-sided bladder mass, including at 3.5 x  2.5 cm on 80/5. This is positioned at the same level as the  previously described right-sided bladder diverticulum, which  measures on the order of 7.3 cm on 74/5. The mass likely involves  the bladder diverticulum, including on 77/5.   Concurrent bladder saccules.   Stomach/Bowel: Proximal gastric underdistention. Apparent gastric  wall thickening including at 2.1 cm on 23/5 is at least partially  felt to be secondary.   Scattered colonic diverticula. Normal terminal ileum and appendix.  Normal small bowel.   Vascular/Lymphatic: Aortic atherosclerosis. Left common iliac  aneurysm of 2.0 cm on 91 coronal. Right common iliac artery ectasia  at 1.7 cm on 83 coronal. No abdominopelvic adenopathy.   Reproductive: Mild prostatomegaly.   Other: No significant free fluid.   Musculoskeletal: Lumbosacral spondylosis with mild S shaped  thoracolumbar spine curvature.   IMPRESSION:  1. Enhancing right bladder mass, consistent with urothelial  carcinoma. This likely involves an enlarging adjacent bladder  diverticulum. Causes moderate right-sided hydroureteronephrosis.  2. No evidence of metastatic disease.  3. Apparent gastric wall thickening is at least partially felt to be  due to underdistention. Correlate with symptoms of gastritis.  4. Coronary artery atherosclerosis. Aortic Atherosclerosis  (  ICD10-I70.0). Common iliac artery dilatation bilaterally.  5. Cholelithiasis  6. Prostatomegaly    Electronically Signed  By: Abigail Miyamoto M.D.  On: 11/26/2020 12:04       ALLERGIES: aleve Ibuprofen    MEDICATIONS: Omeprazole  Coq10  Echinacea-Goldenseal  Glucosamine Complex  [place] Ginseng  Levothyroxine  Pravastatin Sodium     GU PSH: Locm 300-399Mg /Ml Iodine,1Ml - 11/25/2020     NON-GU PSH: No Non-GU PSH    GU PMH: Gross hematuria - 11/25/2020, - 11/12/2020    NON-GU PMH: Depression GERD Gout    FAMILY HISTORY: No Family History     Notes: parents deceased  mother-cancer   SOCIAL HISTORY: Marital Status: Single Preferred Language: English; Ethnicity: Not Hispanic Or Latino; Race: White Current Smoking Status: Patient does not smoke anymore.   Tobacco Use Assessment Completed: Used Tobacco in last 30 days? Does not drink anymore.  Drinks 2 caffeinated drinks per day. Has not had a blood transfusion.    REVIEW OF SYSTEMS:    GU Review Male:   Patient denies frequent urination, hard to postpone urination, burning/ pain with urination, get up at night to urinate, leakage of urine, stream starts and stops, trouble starting your stream, have to strain to urinate , erection problems, and penile pain.  Gastrointestinal (Upper):   Patient denies nausea, vomiting, and indigestion/ heartburn.  Gastrointestinal (Lower):   Patient denies diarrhea and constipation.  Constitutional:   Patient denies fever, night sweats, weight loss, and fatigue.  Skin:   Patient denies skin rash/ lesion and itching.  Eyes:   Patient denies blurred vision and double vision.  Ears/ Nose/ Throat:   Patient denies sore throat and sinus problems.  Hematologic/Lymphatic:   Patient denies swollen glands and easy bruising.  Cardiovascular:   Patient denies leg swelling and chest pains.  Respiratory:   Patient denies cough and shortness of breath.  Endocrine:   Patient denies excessive thirst.  Musculoskeletal:   Patient denies joint pain and back pain.  Neurological:   Patient denies headaches and dizziness.  Psychologic:   Patient denies depression and  anxiety.   VITAL SIGNS:      12/02/2020 08:47 AM  Weight 205 lb / 92.99 kg  Height 72 in / 182.88 cm  BP 134/85 mmHg  Pulse 84 /min  Temperature 98.7 F / 37.0 C  BMI 27.8 kg/m   GU PHYSICAL EXAMINATION:    Testes: No tenderness, no swelling, no enlargement left testes. No tenderness, no swelling, no enlargement right testes. Normal location left testes. Normal location right testes. No mass, no cyst, no varicocele, no hydrocele left testes. No mass, no cyst, no varicocele, no hydrocele right testes.  Urethral Meatus: Normal size. No lesion, no wart, no discharge, no polyp. Normal location.  Penis: Circumcised, no warts, no cracks. No dorsal Peyronie's plaques, no left corporal Peyronie's plaques, no right corporal Peyronie's plaques, no scarring, no warts. No balanitis, no meatal stenosis.   MULTI-SYSTEM PHYSICAL EXAMINATION:    Constitutional: Well-nourished. No physical deformities. Normally developed. Good grooming.  Neck: Neck symmetrical, not swollen. Normal tracheal position.  Respiratory: No labored breathing, no use of accessory muscles.   Cardiovascular: Normal temperature, normal extremity pulses, no swelling, no varicosities.  Lymphatic: No enlargement of neck, axillae, groin.  Skin: No paleness, no jaundice, no cyanosis. No lesion, no ulcer, no rash.  Neurologic / Psychiatric: Oriented to time, oriented to place, oriented to person. No depression, no anxiety, no agitation.  Eyes: Normal conjunctivae. Normal eyelids.  Ears,  Nose, Mouth, and Throat: Left ear no scars, no lesions, no masses. Right ear no scars, no lesions, no masses. Nose no scars, no lesions, no masses. Normal hearing. Normal lips.  Musculoskeletal: Normal gait and station of head and neck.     PAST DATA REVIEW: None   PROCEDURES:         Flexible Cystoscopy - 52000  Risks, benefits, and some of the potential complications of the procedure were discussed at length with the patient including infection,  bleeding, voiding discomfort, urinary retention, fever, chills, sepsis, and others. All questions were answered. Informed consent was obtained. Antibiotic prophylaxis was given. Sterile technique and intraurethral analgesia were used.  Meatus:  Normal size. Normal location. Normal condition.  Urethra:  No strictures.  External Sphincter:  Normal.  Verumontanum:  Normal.  Prostate:  Obstructing. Moderate hyperplasia.  Bladder Neck:  Non-obstructing.  Ureteral Orifices:  Normal location. Normal size. Normal shape. Effluxed clear urine.  Bladder:  Cysto performed today and shows: Exophytic tumor mass in the right lateral floor of the bladder. Difficult to tell whether this impinged on the right ureteral orifice. Size is probably 2-3 cm..      The lower urinary tract was carefully examined. The procedure was well-tolerated and without complications. Antibiotic instructions were given. Instructions were given to call the office immediately for bloody urine, difficulty urinating, urinary retention, painful or frequent urination, fever, chills, nausea, vomiting or other illness. The patient stated that he understood these instructions and would comply with them.         Urinalysis w/Scope - 81001 Dipstick Dipstick Cont'd Micro  Color: Yellow Bilirubin: Neg mg/dL WBC/hpf: 10 - 20/hpf  Appearance: Cloudy Ketones: Neg mg/dL RBC/hpf: 20 - 40/hpf  Specific Gravity: 1.025 Blood: 3+ ery/uL Bacteria: Mod (26-50/hpf)  pH: 6.5 Protein: 1+ mg/dL Cystals: NS (Not Seen)  Glucose: Neg mg/dL Urobilinogen: 0.2 mg/dL Casts: NS (Not Seen)    Nitrites: Neg Trichomonas: Not Present    Leukocyte Esterase: Neg leu/uL Mucous: Not Present      Epithelial Cells: 0 - 5/hpf      Yeast: NS (Not Seen)      Sperm: Not Present    ASSESSMENT:      ICD-10 Details  1 GU:   Bladder tumor/neoplasm - D41.4 Acute, Complicated Injury  2   Gross hematuria - J85.6 Acute, Complicated Injury   PLAN:           Document Letter(s):   Created for Patient: Clinical Summary         Notes:   Discussed cystoscopic findings with the patient. Recommended cysto TURBT and retrogrades. This will be scheduled in the near P future. Risks and benefits discussed as outlined below.  TURBT consent: I have discussed with the patient the risks, benefits of TURBT which include but are not limited to: Bleeding, infection, damage to the bladder with potential perforation of the bladder, damage to surrounding organs, possible need for further procedures including open repair and catheterization, possibility of nonhealing area within the bladder, urgency, frequency which may be refractory to medications. I pointed out that in some occasions after resection of the bladder tumor, mitomycin-C chemotherapy may be instilled into the bladder. The risks associated with this therapy include but are not limited to: Refractory or new onset urgency, frequency, dysuria, infrequently severe systemic side effects secondary to mitomycin-C. After full discussion of the risks, benefits and alternatives, the patient has consented to the above procedure and desires to proceed.

## 2020-12-21 ENCOUNTER — Encounter (HOSPITAL_BASED_OUTPATIENT_CLINIC_OR_DEPARTMENT_OTHER): Payer: Self-pay | Admitting: Urology

## 2020-12-21 ENCOUNTER — Ambulatory Visit (HOSPITAL_BASED_OUTPATIENT_CLINIC_OR_DEPARTMENT_OTHER): Payer: Medicare Other | Admitting: Anesthesiology

## 2020-12-21 ENCOUNTER — Other Ambulatory Visit: Payer: Self-pay

## 2020-12-21 ENCOUNTER — Ambulatory Visit (HOSPITAL_BASED_OUTPATIENT_CLINIC_OR_DEPARTMENT_OTHER)
Admission: RE | Admit: 2020-12-21 | Discharge: 2020-12-22 | Disposition: A | Discharge status: Home / Self Care | Payer: Medicare Other | Attending: Urology | Admitting: Urology

## 2020-12-21 ENCOUNTER — Encounter (HOSPITAL_BASED_OUTPATIENT_CLINIC_OR_DEPARTMENT_OTHER)
Admission: RE | Disposition: A | Discharge status: Home / Self Care | Payer: Self-pay | Source: Home / Self Care | Attending: Urology

## 2020-12-21 DIAGNOSIS — N323 Diverticulum of bladder: Secondary | ICD-10-CM | POA: Diagnosis not present

## 2020-12-21 DIAGNOSIS — Z886 Allergy status to analgesic agent status: Secondary | ICD-10-CM | POA: Diagnosis not present

## 2020-12-21 DIAGNOSIS — Z79899 Other long term (current) drug therapy: Secondary | ICD-10-CM | POA: Insufficient documentation

## 2020-12-21 DIAGNOSIS — E785 Hyperlipidemia, unspecified: Secondary | ICD-10-CM | POA: Diagnosis not present

## 2020-12-21 DIAGNOSIS — E039 Hypothyroidism, unspecified: Secondary | ICD-10-CM | POA: Diagnosis not present

## 2020-12-21 DIAGNOSIS — Z79890 Hormone replacement therapy (postmenopausal): Secondary | ICD-10-CM | POA: Diagnosis not present

## 2020-12-21 DIAGNOSIS — R310 Gross hematuria: Secondary | ICD-10-CM | POA: Insufficient documentation

## 2020-12-21 DIAGNOSIS — Z809 Family history of malignant neoplasm, unspecified: Secondary | ICD-10-CM | POA: Insufficient documentation

## 2020-12-21 DIAGNOSIS — C678 Malignant neoplasm of overlapping sites of bladder: Secondary | ICD-10-CM | POA: Diagnosis not present

## 2020-12-21 DIAGNOSIS — R7303 Prediabetes: Secondary | ICD-10-CM | POA: Diagnosis not present

## 2020-12-21 DIAGNOSIS — Z87891 Personal history of nicotine dependence: Secondary | ICD-10-CM | POA: Diagnosis not present

## 2020-12-21 DIAGNOSIS — C679 Malignant neoplasm of bladder, unspecified: Secondary | ICD-10-CM | POA: Diagnosis not present

## 2020-12-21 HISTORY — DX: Hypothyroidism, unspecified: E03.9

## 2020-12-21 HISTORY — DX: Other chronic pain: G89.29

## 2020-12-21 HISTORY — DX: Vitamin D deficiency, unspecified: E55.9

## 2020-12-21 HISTORY — DX: Hematuria, unspecified: R31.9

## 2020-12-21 HISTORY — DX: Malignant neoplasm of bladder, unspecified: C67.9

## 2020-12-21 HISTORY — PX: TRANSURETHRAL RESECTION OF BLADDER TUMOR: SHX2575

## 2020-12-21 HISTORY — DX: Headache, unspecified: R51.9

## 2020-12-21 HISTORY — DX: Hyperlipidemia, unspecified: E78.5

## 2020-12-21 HISTORY — PX: CYSTOSCOPY W/ RETROGRADES: SHX1426

## 2020-12-21 HISTORY — DX: Presence of dental prosthetic device (complete) (partial): Z97.2

## 2020-12-21 HISTORY — DX: Prediabetes: R73.03

## 2020-12-21 HISTORY — DX: Gout, unspecified: M10.9

## 2020-12-21 HISTORY — DX: Gastritis, unspecified, without bleeding: K29.70

## 2020-12-21 HISTORY — DX: Unspecified osteoarthritis, unspecified site: M19.90

## 2020-12-21 HISTORY — DX: Presence of spectacles and contact lenses: Z97.3

## 2020-12-21 SURGERY — TURBT (TRANSURETHRAL RESECTION OF BLADDER TUMOR)
Anesthesia: General | Site: Pelvis

## 2020-12-21 MED ORDER — GLYCOPYRROLATE 0.2 MG/ML IJ SOLN
INTRAMUSCULAR | Status: DC | PRN
  Administered 2020-12-21: .1 mg via INTRAVENOUS

## 2020-12-21 MED ORDER — ONDANSETRON HCL 4 MG/2ML IJ SOLN
INTRAMUSCULAR | Status: DC | PRN
  Administered 2020-12-21: 4 mg via INTRAVENOUS

## 2020-12-21 MED ORDER — GLYCOPYRROLATE PF 0.2 MG/ML IJ SOSY
PREFILLED_SYRINGE | INTRAMUSCULAR | Status: AC
  Filled 2020-12-21: qty 1

## 2020-12-21 MED ORDER — LEVOTHYROXINE SODIUM 112 MCG PO TABS
112.0000 ug | ORAL_TABLET | Freq: Every day | ORAL | Status: DC
  Administered 2020-12-22: 112 ug via ORAL
  Filled 2020-12-21 (×2): qty 1

## 2020-12-21 MED ORDER — ACETAMINOPHEN 325 MG PO TABS
ORAL_TABLET | ORAL | Status: AC
  Filled 2020-12-21: qty 2

## 2020-12-21 MED ORDER — PROPOFOL 10 MG/ML IV BOLUS
INTRAVENOUS | Status: AC
  Filled 2020-12-21: qty 60

## 2020-12-21 MED ORDER — CEFAZOLIN SODIUM-DEXTROSE 2-4 GM/100ML-% IV SOLN
INTRAVENOUS | Status: AC
  Filled 2020-12-21: qty 100

## 2020-12-21 MED ORDER — HYDROCODONE-ACETAMINOPHEN 5-325 MG PO TABS
1.0000 | ORAL_TABLET | ORAL | Status: DC | PRN
  Administered 2020-12-21 – 2020-12-22 (×4): 1 via ORAL

## 2020-12-21 MED ORDER — PANTOPRAZOLE SODIUM 40 MG PO TBEC
DELAYED_RELEASE_TABLET | ORAL | Status: AC
  Filled 2020-12-21: qty 1

## 2020-12-21 MED ORDER — LIDOCAINE HCL (CARDIAC) PF 100 MG/5ML IV SOSY
PREFILLED_SYRINGE | INTRAVENOUS | Status: DC | PRN
  Administered 2020-12-21: 80 mg via INTRAVENOUS

## 2020-12-21 MED ORDER — FENTANYL CITRATE (PF) 100 MCG/2ML IJ SOLN
25.0000 ug | INTRAMUSCULAR | Status: DC | PRN
  Administered 2020-12-21 (×4): 25 ug via INTRAVENOUS

## 2020-12-21 MED ORDER — FENTANYL CITRATE (PF) 100 MCG/2ML IJ SOLN
INTRAMUSCULAR | Status: AC
  Filled 2020-12-21: qty 2

## 2020-12-21 MED ORDER — CEFAZOLIN SODIUM-DEXTROSE 2-4 GM/100ML-% IV SOLN
2.0000 g | INTRAVENOUS | Status: AC
  Administered 2020-12-21: 2 g via INTRAVENOUS

## 2020-12-21 MED ORDER — IOHEXOL 300 MG/ML  SOLN
INTRAMUSCULAR | Status: DC | PRN
  Administered 2020-12-21: 10 mL via URETHRAL

## 2020-12-21 MED ORDER — OXYBUTYNIN CHLORIDE 5 MG PO TABS
5.0000 mg | ORAL_TABLET | Freq: Three times a day (TID) | ORAL | Status: DC | PRN
  Administered 2020-12-21: 5 mg via ORAL

## 2020-12-21 MED ORDER — BELLADONNA ALKALOIDS-OPIUM 16.2-60 MG RE SUPP: 1.0000 | Freq: Four times a day (QID) | RECTAL | Status: DC | PRN

## 2020-12-21 MED ORDER — ROCURONIUM BROMIDE 100 MG/10ML IV SOLN
INTRAVENOUS | Status: DC | PRN
  Administered 2020-12-21: 10 mg via INTRAVENOUS
  Administered 2020-12-21: 60 mg via INTRAVENOUS
  Administered 2020-12-21: 10 mg via INTRAVENOUS

## 2020-12-21 MED ORDER — HYDROCODONE-ACETAMINOPHEN 5-325 MG PO TABS
ORAL_TABLET | ORAL | Status: AC
  Filled 2020-12-21: qty 1

## 2020-12-21 MED ORDER — SODIUM CHLORIDE 0.9 % IR SOLN
3000.0000 mL | Status: DC
  Administered 2020-12-21: 3000 mL

## 2020-12-21 MED ORDER — BELLADONNA ALKALOIDS-OPIUM 16.2-60 MG RE SUPP
RECTAL | Status: AC
  Filled 2020-12-21: qty 1

## 2020-12-21 MED ORDER — LACTATED RINGERS IV SOLN
INTRAVENOUS | Status: DC
  Administered 2020-12-21: 1000 mL via INTRAVENOUS

## 2020-12-21 MED ORDER — HYDROMORPHONE HCL 1 MG/ML IJ SOLN: 0.5000 mg | INTRAMUSCULAR | Status: DC | PRN

## 2020-12-21 MED ORDER — ZOLPIDEM TARTRATE 5 MG PO TABS: 5.0000 mg | ORAL_TABLET | Freq: Every evening | ORAL | Status: DC | PRN

## 2020-12-21 MED ORDER — FENTANYL CITRATE (PF) 100 MCG/2ML IJ SOLN
INTRAMUSCULAR | Status: DC | PRN
  Administered 2020-12-21 (×2): 25 ug via INTRAVENOUS
  Administered 2020-12-21: 50 ug via INTRAVENOUS

## 2020-12-21 MED ORDER — ONDANSETRON HCL 4 MG/2ML IJ SOLN: 4.0000 mg | INTRAMUSCULAR | Status: DC | PRN

## 2020-12-21 MED ORDER — ACETAMINOPHEN 10 MG/ML IV SOLN
1000.0000 mg | Freq: Once | INTRAVENOUS | Status: DC | PRN
  Administered 2020-12-21: 1000 mg via INTRAVENOUS

## 2020-12-21 MED ORDER — OXYBUTYNIN CHLORIDE 5 MG PO TABS
ORAL_TABLET | ORAL | Status: AC
  Filled 2020-12-21: qty 1

## 2020-12-21 MED ORDER — ONDANSETRON HCL 4 MG/2ML IJ SOLN: 4.0000 mg | Freq: Once | INTRAMUSCULAR | Status: DC | PRN

## 2020-12-21 MED ORDER — SUGAMMADEX SODIUM 200 MG/2ML IV SOLN
INTRAVENOUS | Status: DC | PRN
  Administered 2020-12-21: 200 mg via INTRAVENOUS

## 2020-12-21 MED ORDER — PANTOPRAZOLE SODIUM 40 MG PO TBEC
40.0000 mg | DELAYED_RELEASE_TABLET | Freq: Every day | ORAL | Status: DC
  Administered 2020-12-21: 40 mg via ORAL

## 2020-12-21 MED ORDER — DEXAMETHASONE SODIUM PHOSPHATE 4 MG/ML IJ SOLN
INTRAMUSCULAR | Status: DC | PRN
  Administered 2020-12-21: 8 mg via INTRAVENOUS

## 2020-12-21 MED ORDER — ACETAMINOPHEN 10 MG/ML IV SOLN
INTRAVENOUS | Status: AC
  Filled 2020-12-21: qty 100

## 2020-12-21 MED ORDER — SODIUM CHLORIDE 0.9 % IV SOLN: INTRAVENOUS | Status: DC

## 2020-12-21 MED ORDER — PROPOFOL 10 MG/ML IV BOLUS
INTRAVENOUS | Status: DC | PRN
  Administered 2020-12-21: 50 mg via INTRAVENOUS
  Administered 2020-12-21: 160 mg via INTRAVENOUS

## 2020-12-21 MED ORDER — ACETAMINOPHEN 325 MG PO TABS
650.0000 mg | ORAL_TABLET | ORAL | Status: DC | PRN
  Administered 2020-12-21: 650 mg via ORAL

## 2020-12-21 MED ORDER — SODIUM CHLORIDE 0.9 % IR SOLN
Status: DC | PRN
  Administered 2020-12-21 (×6): 6000 mL

## 2020-12-21 MED ORDER — BELLADONNA ALKALOIDS-OPIUM 16.2-60 MG RE SUPP
RECTAL | Status: DC | PRN
  Administered 2020-12-21: 1 via RECTAL

## 2020-12-21 SURGICAL SUPPLY — 42 items
BAG DRAIN URO-CYSTO SKYTR STRL (DRAIN) ×3 IMPLANT
BAG DRN RND TRDRP ANRFLXCHMBR (UROLOGICAL SUPPLIES)
BAG DRN UROCATH (DRAIN) ×2
BAG URINE DRAIN 2000ML AR STRL (UROLOGICAL SUPPLIES) IMPLANT
BAG URINE LEG 500ML (DRAIN) IMPLANT
BULB IRRIG PATHFIND (MISCELLANEOUS) IMPLANT
CATH FOLEY 2WAY SLVR  5CC 20FR (CATHETERS)
CATH FOLEY 2WAY SLVR  5CC 22FR (CATHETERS)
CATH FOLEY 2WAY SLVR 5CC 20FR (CATHETERS) IMPLANT
CATH FOLEY 2WAY SLVR 5CC 22FR (CATHETERS) IMPLANT
CATH HEMA 3WAY 30CC 22FR COUDE (CATHETERS) ×3 IMPLANT
CATH URET 5FR 28IN CONE TIP (BALLOONS)
CATH URET 5FR 28IN OPEN ENDED (CATHETERS) ×3 IMPLANT
CATH URET 5FR 70CM CONE TIP (BALLOONS) IMPLANT
CLOTH BEACON ORANGE TIMEOUT ST (SAFETY) ×3 IMPLANT
ELECT COAG BIPOLAR CYL 1.2MMM (ELECTROSURGICAL) ×3
ELECT REM PT RETURN 9FT ADLT (ELECTROSURGICAL) ×3
ELECTRODE COAG BIPLR CYL 1.2MM (ELECTROSURGICAL) ×2 IMPLANT
ELECTRODE REM PT RTRN 9FT ADLT (ELECTROSURGICAL) ×2 IMPLANT
EVACUATOR MICROVAS BLADDER (UROLOGICAL SUPPLIES) ×3 IMPLANT
FIBER LASER FLEXIVA 365 (UROLOGICAL SUPPLIES) IMPLANT
GLOVE SURG ENC MOIS LTX SZ7.5 (GLOVE) ×3 IMPLANT
GOWN STRL REUS W/TWL LRG LVL3 (GOWN DISPOSABLE) ×3 IMPLANT
GUIDEWIRE ANG ZIPWIRE 038X150 (WIRE) IMPLANT
GUIDEWIRE STR DUAL SENSOR (WIRE) IMPLANT
HOLDER FOLEY CATH W/STRAP (MISCELLANEOUS) IMPLANT
IV NS IRRIG 3000ML ARTHROMATIC (IV SOLUTION) ×39 IMPLANT
KIT TURNOVER CYSTO (KITS) ×3 IMPLANT
LOOP CUT BIPOLAR 24F LRG (ELECTROSURGICAL) ×3 IMPLANT
LOOP MONOPOLAR YLW (ELECTROSURGICAL) IMPLANT
MANIFOLD NEPTUNE II (INSTRUMENTS) ×3 IMPLANT
NS IRRIG 500ML POUR BTL (IV SOLUTION) ×3 IMPLANT
PACK CYSTO (CUSTOM PROCEDURE TRAY) ×3 IMPLANT
PLUG CATH AND CAP STER (CATHETERS) IMPLANT
SYR 20ML LL LF (SYRINGE) ×3 IMPLANT
SYR 30ML LL (SYRINGE) ×3 IMPLANT
SYR TOOMEY IRRIG 70ML (MISCELLANEOUS)
SYRINGE TOOMEY IRRIG 70ML (MISCELLANEOUS) IMPLANT
TRACTIP FLEXIVA PULS ID 200XHI (Laser) IMPLANT
TRACTIP FLEXIVA PULSE ID 200 (Laser)
TUBE CONNECTING 12X1/4 (SUCTIONS) ×6 IMPLANT
WATER STERILE IRR 500ML POUR (IV SOLUTION) ×3 IMPLANT

## 2020-12-21 NOTE — Anesthesia Postprocedure Evaluation (Signed)
Anesthesia Post Note  Patient: LATASHA PUSKAS  Procedure(s) Performed: TRANSURETHRAL RESECTION OF BLADDER TUMOR (TURBT) (N/A Bladder) CYSTOSCOPY WITH RETROGRADE PYELOGRAM (Bilateral Pelvis)     Patient location during evaluation: PACU Anesthesia Type: General Level of consciousness: awake and alert Pain management: pain level controlled Vital Signs Assessment: post-procedure vital signs reviewed and stable Respiratory status: spontaneous breathing, nonlabored ventilation, respiratory function stable and patient connected to nasal cannula oxygen Cardiovascular status: blood pressure returned to baseline and stable Postop Assessment: no apparent nausea or vomiting Anesthetic complications: no   No complications documented.  Last Vitals:  Vitals:   12/21/20 1701 12/21/20 1943  BP: 135/82 134/76  Pulse: (!) 108 (!) 109  Resp: 16 18  Temp:  37.1 C  SpO2: 97% 95%    Last Pain:  Vitals:   12/21/20 2004  TempSrc:   PainSc: 5                  Tawan Degroote P Kaidan Spengler

## 2020-12-21 NOTE — Interval H&P Note (Signed)
History and Physical Interval Note:  12/21/2020 8:36 AM  Lance Friedman  has presented today for surgery, with the diagnosis of BLADDER CANCER.  The various methods of treatment have been discussed with the patient and family. After consideration of risks, benefits and other options for treatment, the patient has consented to  Procedure(s) with comments: TRANSURETHRAL RESECTION OF BLADDER TUMOR (TURBT) (N/A) - 45 MINS CYSTOSCOPY WITH RETROGRADE PYELOGRAM (Bilateral) as a surgical intervention.  The patient's history has been reviewed, patient examined, no change in status, stable for surgery.  I have reviewed the patient's chart and labs.  Questions were answered to the patient's satisfaction.     [name]

## 2020-12-21 NOTE — Op Note (Signed)
Preoperative diagnosis:  1.  Bladder cancer  Postoperative diagnosis: 1.  Bladder cancer  Procedure(s): 1.  Cystoscopy, left retrograde pyelogram with intraoperative interpretation, transurethral section of bladder tumor (large)  Surgeon: Dr. Harold Barban  Anesthesia: General  Complications: None  EBL: 50 cc  Specimens: Bladder tumor  Disposition of specimens: Specimen to pathology  Intraoperative findings: Very large bladder tumor encompassing the majority of the floor and part of the right lateral bladder wall.  Ureteral orifice could not be identified on the right.  Tumor also involve the edge of large bladder diverticulum.  Tumor was resected down into muscle but incompletely resected due to the depth of invasion of the tumor.  41 [place] three-way hematuria catheter placed  Indication: Patient is a 75 year old white male presented with gross hematuria.  Found to have a large bladder tumor in the right floor the bladder.  Presents at this time to undergo cystoscopy retrograde and TURBT.  Description of procedure:  After obtaining informed consent for the patient was taken the major cystoscopy suite placed under general anesthesia.  Placed in the dorsolithotomy position genitalia prepped and draped in usual sterile fashion.  Proper pause and timeout performed prior to procedure.  21 [place] scope advanced in the bladder without difficulty.  Upon entering the bladder large exophytic mass noted along the right floor the bladder and extending up onto the right lateral bladder wall is well.  Size the tumor was at least 5 cm.  The right ureteral orifice could not be identified.  The left ureteral orifice was identified.  This was cannulated with a 5 French open tip catheter and retrograde pyelogram showed normal filling of the left upper urinary tract without evidence of filling defect or hydronephrosis.  Left upper tract and Probably upon removal of the retrograde catheter.  Attention was  then directed towards resection of the tumor.  Utilizing the bipolar saline resectoscope this was placed utilizing direct vision.  Tumor was then resected slowly down to its base and extended essentially over the whole right Hemi trigone area.  There was also a mouth of the diverticulum noted and there was tumor that was exophytic and somewhat floppy over the edge of the diverticulum.  I was able to get the tip of the scope into the diverticulum and subsequently resect this tumor along the edge of the diverticulum.  The remainder of the tumor was resected again down to muscle but I was unable to get down to normal tissue.  It was felt that I would get pathology back as it was highly likely to be muscle invasive and not to risk further resection or perforation at this point.  The bladder tumor specimen was collected utilizing the Urovac.  Second look in the bladder revealed no remaining tumor chips.  The rollerball attachment was then utilized to cauterize the base of the resected area and along the edge of the bladder diverticulum.  Good hemostasis was noted.  The resectoscope was removed and a 22 [place] hematuria catheter was placed to light CBI irrigation with slightly pink effluent noted.  Patient tolerated procedure well was returned to the recovery room in stable condition.  There were no immediate complications from the procedure.

## 2020-12-21 NOTE — Anesthesia Procedure Notes (Addendum)
Procedure Name: LMA Insertion Date/Time: 12/21/2020 9:12 AM Performed by: Georgeanne Nim, CRNA Pre-anesthesia Checklist: Patient identified, Emergency Drugs available, Suction available, Patient being monitored and Timeout performed Patient Re-evaluated:Patient Re-evaluated prior to induction Oxygen Delivery Method: Circle system utilized Preoxygenation: Pre-oxygenation with 100% oxygen Induction Type: IV induction Ventilation: Mask ventilation without difficulty LMA: LMA inserted LMA Size: 5.0 Number of attempts: 2 (small leak with first placement) Placement Confirmation: positive ETCO2,  CO2 detector and breath sounds checked- equal and bilateral Tube secured with: Tape Dental Injury: Teeth and Oropharynx as per pre-operative assessment

## 2020-12-21 NOTE — Transfer of Care (Signed)
Immediate Anesthesia Transfer of Care Note  Patient: Lance Friedman  Procedure(s) Performed: TRANSURETHRAL RESECTION OF BLADDER TUMOR (TURBT) (N/A Bladder) CYSTOSCOPY WITH RETROGRADE PYELOGRAM (Bilateral Pelvis)  Patient Location: PACU  Anesthesia Type:General  Level of Consciousness: awake, alert , oriented and patient cooperative  Airway & Oxygen Therapy: Patient Spontanous Breathing and Patient connected to nasal cannula oxygen  Post-op Assessment: Report given to RN and Post -op Vital signs reviewed and stable  Post vital signs: Reviewed and stable  Last Vitals:  Vitals Value Taken Time  BP 154/97 12/21/20 1058  Temp    Pulse 67 12/21/20 1059  Resp 12 12/21/20 1059  SpO2 99 % 12/21/20 1059  Vitals shown include unvalidated device data.  Last Pain:  Vitals:   12/21/20 0728  TempSrc: Oral  PainSc: 7       Patients Stated Pain Goal: 7 (03/70/96 4383)  Complications: No complications documented.

## 2020-12-21 NOTE — Anesthesia Procedure Notes (Signed)
Procedure Name: Intubation Date/Time: 12/21/2020 9:08 AM Performed by: Georgeanne Nim, CRNA Pre-anesthesia Checklist: Patient identified, Emergency Drugs available, Suction available and Patient being monitored Patient Re-evaluated:Patient Re-evaluated prior to induction Oxygen Delivery Method: Circle system utilized Preoxygenation: Pre-oxygenation with 100% oxygen Induction Type: IV induction Ventilation: Mask ventilation without difficulty Laryngoscope Size: Mac and 4 Grade View: Grade I Tube size: 7.5 mm Number of attempts: 1 Airway Equipment and Method: Stylet Placement Confirmation: ETT inserted through vocal cords under direct vision,  positive ETCO2,  CO2 detector and breath sounds checked- equal and bilateral Secured at: 22 cm Tube secured with: Tape Dental Injury: Teeth and Oropharynx as per pre-operative assessment

## 2020-12-22 ENCOUNTER — Encounter (HOSPITAL_BASED_OUTPATIENT_CLINIC_OR_DEPARTMENT_OTHER): Payer: Self-pay | Admitting: Urology

## 2020-12-22 DIAGNOSIS — C679 Malignant neoplasm of bladder, unspecified: Secondary | ICD-10-CM | POA: Diagnosis not present

## 2020-12-22 DIAGNOSIS — Z87891 Personal history of nicotine dependence: Secondary | ICD-10-CM | POA: Diagnosis not present

## 2020-12-22 DIAGNOSIS — Z886 Allergy status to analgesic agent status: Secondary | ICD-10-CM | POA: Diagnosis not present

## 2020-12-22 DIAGNOSIS — R310 Gross hematuria: Secondary | ICD-10-CM | POA: Diagnosis not present

## 2020-12-22 DIAGNOSIS — Z809 Family history of malignant neoplasm, unspecified: Secondary | ICD-10-CM | POA: Diagnosis not present

## 2020-12-22 DIAGNOSIS — Z79899 Other long term (current) drug therapy: Secondary | ICD-10-CM | POA: Diagnosis not present

## 2020-12-22 LAB — SURGICAL PATHOLOGY

## 2020-12-22 MED ORDER — HYDROCODONE-ACETAMINOPHEN 5-325 MG PO TABS
ORAL_TABLET | ORAL | Status: AC
  Filled 2020-12-22: qty 1

## 2020-12-22 NOTE — Discharge Instructions (Signed)
Transurethral Resection of Bladder Tumor, Care After This sheet gives you information about how to care for yourself after your procedure. Your health care provider may also give you more specific instructions. If you have problems or questions, contact your health care provider. What can I expect after the procedure? After the procedure, it is common to have:  A small amount of blood in your urine for up to 2 weeks.  Soreness or mild pain from your catheter. After your catheter is removed, you may have mild soreness, especially when urinating.  Pain in your lower abdomen. Follow these instructions at home: Medicines  Take over-the-counter and prescription medicines only as told by your health care provider.  If you were prescribed an antibiotic medicine, take it as told by your health care provider. Do not stop taking the antibiotic even if you start to feel better.  Do not drive for 24 hours if you were given a sedative during your procedure.  Ask your health care provider if the medicine prescribed to you: ? Requires you to avoid driving or using heavy machinery. ? Can cause constipation. You may need to take these actions to prevent or treat constipation:  Take over-the-counter or prescription medicines.  Eat foods that are high in fiber, such as beans, whole grains, and fresh fruits and vegetables.  Limit foods that are high in fat and processed sugars, such as fried or sweet foods.   Activity  Return to your normal activities as told by your health care provider. Ask your health care provider what activities are safe for you.  Do not lift anything that is heavier than 10 lb (4.5 kg), or the limit that you are told, until your health care provider says that it is safe.  Avoid intense physical activity for as long as told by your health care provider.  Rest as told by your health care provider.  Avoid sitting for a long time without moving. Get up to take short walks every  1-2 hours. This is important to improve blood flow and breathing. Ask for help if you feel weak or unsteady. General instructions  Do not drink alcohol for as long as told by your health care provider. This is especially important if you are taking prescription pain medicines.  Do not take baths, swim, or use a hot tub until your health care provider approves. Ask your health care provider if you may take showers. You may only be allowed to take sponge baths.  If you have a catheter, follow instructions from your health care provider about caring for your catheter and your drainage bag.  Drink enough fluid to keep your urine pale yellow.  Wear compression stockings as told by your health care provider. These stockings help to prevent blood clots and reduce swelling in your legs.  Keep all follow-up visits as told by your health care provider. This is important. ? You will need to be followed closely with regular checks of your bladder and urethra (cystoscopies) to make sure that the cancer does not come back.   Contact a health care provider if:  You have pain that gets worse or does not improve with medicine.  You have blood in your urine for more than 2 weeks.  You have cloudy or bad-smelling urine.  You become constipated. Signs of constipation may include having: ? Fewer than three bowel movements in a week. ? Difficulty having a bowel movement. ? Stools that are dry, hard, or larger than normal.  You have a fever. Get help right away if:  You have: ? Severe pain. ? Bright red blood in your urine. ? Blood clots in your urine. ? A lot of blood in your urine.  Your catheter has been removed and you are not able to urinate.  You have a catheter in place and the catheter is not draining urine. Summary  After your procedure, it is common to have a small amount of blood in your urine, soreness or mild pain from your catheter, and pain in your lower abdomen.  Take  over-the-counter and prescription medicines only as told by your health care provider.  Rest as told by your health care provider. Follow your health care provider's instructions about returning to normal activities. Ask what activities are safe for you.  If you have a catheter, follow instructions from your health care provider about caring for your catheter and your drainage bag.  Get help right away if you cannot urinate, you have severe pain, or you have bright red blood or blood clots in your urine. This information is not intended to replace advice given to you by your health care provider. Make sure you discuss any questions you have with your health care provider. Document Revised: 03/21/2018 Document Reviewed: 03/21/2018 Elsevier Patient Education  [employer].

## 2020-12-22 NOTE — Final Progress Note (Signed)
1 Day Post-Op Subjective: Patient feeling well status post cystoscopy and TURBT.  Urine remains clear this morning and is off CBI.  Objective: Vital signs in last 24 hours: Temp:  [97.3 F (36.3 C)-98.8 F (37.1 C)] 98.5 F (36.9 C) (04/20 0559) Pulse Rate:  [55-109] 74 (04/20 0559) Resp:  [10-18] 16 (04/20 0559) BP: (124-154)/(76-121) 136/83 (04/20 0559) SpO2:  [94 %-100 %] 98 % (04/20 0559)  Intake/Output from previous day: 04/19 0701 - 04/20 0700 In: 7290 [P.O.:1340; I.V.:1400; IV Piggyback:100] Out: 12625 [Urine:12550; Blood:75] Intake/Output this shift: No intake/output data recorded.  Physical Exam:  General: Alert and oriented GU: Foley in place draining clear urine Ext: NT, No erythema  Lab Results: No results for input(s): HGB, HCT in the last 72 hours. BMET No results for input(s): NA, K, CL, CO2, GLUCOSE, BUN, CREATININE, CALCIUM in the last 72 hours.   Studies/Results: No results found.  Assessment/Plan: Status post cystoscopy TURBT for large bladder tumor, doing well Plan/recommendation plan to DC CBI today assuming urine stays clear will discharge home with Foley catheter and to return in approximately 1 week for Foley removal and pathology review.    LOS: 0 days   Lance Friedman 12/22/2020, 8:32 AM

## 2020-12-22 NOTE — Discharge Summary (Signed)
  Date of admission: 12/21/2020  Date of discharge: 12/22/2020  Admission diagnosis: Bladder cancer  Discharge diagnosis: Bladder cancer  Secondary diagnoses:   History and Physical: For full details, please see admission history and physical. Briefly, Lance Friedman is a 75 y.o. year old patient with history of gross hematuria.  Was found to have large bladder tumor and presents at this time undergo cystoscopy and TURBT.   Hospital Course: Patient was admitted on 12/21/2020 after undergoing cystoscopy and TURBT.  For details of procedure please see the typed operative note.  Patient did well postoperative night 1 and had continuous bladder irrigation which was weaned off.  Urine was clear the following postoperative day.  Patient was felt ready for discharge home with Foley catheter.  CBI was discontinued.  He will be discharged home on his routine preoperative medications.  Schedule return in approximately 1 week for Foley catheter removal and pathology review in the office.  He knows our telephone number and knows to call should problems arise relative to his management in the interim.  Laboratory values: No results for input(s): HGB, HCT in the last 72 hours. No results for input(s): CREATININE in the last 72 hours.  Disposition: Home  Discharge instruction: The patient was instructed to be ambulatory but told to refrain from heavy lifting, strenuous activity, or driving.   Discharge medications:  Allergies as of 12/22/2020      Reactions   Aleve [naproxen]     Cannot take Naproxen sodium due to past stomach damage       Medication List    TAKE these medications   acetaminophen 500 MG tablet Commonly known as: TYLENOL Take 1,000 mg by mouth every 6 (six) hours as needed.   co-enzyme Q-10 30 MG capsule Take 30 mg by mouth daily.   FENUGREEK PO Take by mouth. 1-2 tabs per day   FISH OIL PO Take 1 capsule by mouth daily. Salmon oil 2 -3 tabs daily, 1 in an 2 in pm 1000 mg  each   Ginseng 100 MG Caps Take by mouth. 1-2 capsules daily   glucosamine-chondroitin 500-400 MG tablet Take 1 tablet by mouth daily. 3 tabs daily Glucosamine with msm   levothyroxine 112 MCG tablet Commonly known as: SYNTHROID Take 112 mcg by mouth daily before breakfast.   multivitamin tablet Take 1 tablet by mouth daily.   NON FORMULARY Achanesia golden seal 2 tabs daily   omeprazole 20 MG capsule Commonly known as: PRILOSEC Take 20 mg by mouth 2 (two) times daily before a meal.   OVER THE COUNTER MEDICATION Calcium magnesium zinc 3 tabs daily   OVER THE COUNTER MEDICATION Apply 1 application topically daily as needed (icy hot balm to neck prn and right shoulder prn).   pravastatin 20 MG tablet Commonly known as: PRAVACHOL Take 20 mg by mouth at bedtime.   UNABLE TO FIND Probiotic daily   UNABLE TO FIND Advanced super beta prostate  1 tab bid   VALERIAN ROOT PO Take by mouth at bedtime as needed.   vitamin C 500 MG tablet Commonly known as: ASCORBIC ACID Take 500 mg by mouth daily. 2 tabs daily   VITAMIN E PO Take 1 tablet by mouth daily. 1000 mg daily       Followup:

## 2020-12-27 ENCOUNTER — Other Ambulatory Visit (HOSPITAL_COMMUNITY): Payer: Self-pay | Admitting: Physician Assistant

## 2020-12-27 ENCOUNTER — Other Ambulatory Visit (HOSPITAL_COMMUNITY): Payer: Self-pay | Admitting: Urology

## 2020-12-27 DIAGNOSIS — C678 Malignant neoplasm of overlapping sites of bladder: Secondary | ICD-10-CM | POA: Diagnosis not present

## 2020-12-27 DIAGNOSIS — N1330 Unspecified hydronephrosis: Secondary | ICD-10-CM | POA: Diagnosis not present

## 2020-12-27 DIAGNOSIS — N2889 Other specified disorders of kidney and ureter: Secondary | ICD-10-CM

## 2020-12-27 DIAGNOSIS — C679 Malignant neoplasm of bladder, unspecified: Secondary | ICD-10-CM

## 2020-12-28 ENCOUNTER — Other Ambulatory Visit: Payer: Self-pay

## 2020-12-28 ENCOUNTER — Encounter (HOSPITAL_COMMUNITY): Payer: Self-pay

## 2020-12-28 ENCOUNTER — Other Ambulatory Visit: Payer: Self-pay | Admitting: Radiology

## 2020-12-28 ENCOUNTER — Ambulatory Visit (HOSPITAL_COMMUNITY)
Admission: RE | Admit: 2020-12-28 | Discharge: 2020-12-28 | Disposition: A | Discharge status: Home / Self Care | Payer: Medicare Other | Source: Ambulatory Visit | Attending: Urology | Admitting: Urology

## 2020-12-28 DIAGNOSIS — Z79890 Hormone replacement therapy (postmenopausal): Secondary | ICD-10-CM | POA: Diagnosis not present

## 2020-12-28 DIAGNOSIS — C679 Malignant neoplasm of bladder, unspecified: Secondary | ICD-10-CM

## 2020-12-28 DIAGNOSIS — N1330 Unspecified hydronephrosis: Secondary | ICD-10-CM | POA: Diagnosis not present

## 2020-12-28 DIAGNOSIS — Z79899 Other long term (current) drug therapy: Secondary | ICD-10-CM | POA: Insufficient documentation

## 2020-12-28 DIAGNOSIS — Z87891 Personal history of nicotine dependence: Secondary | ICD-10-CM | POA: Insufficient documentation

## 2020-12-28 DIAGNOSIS — N2889 Other specified disorders of kidney and ureter: Secondary | ICD-10-CM | POA: Insufficient documentation

## 2020-12-28 HISTORY — PX: IR NEPHROSTOMY PLACEMENT RIGHT: IMG6064

## 2020-12-28 LAB — CBC
HCT: 32.9 % — ABNORMAL LOW (ref 39.0–52.0)
Hemoglobin: 11.4 g/dL — ABNORMAL LOW (ref 13.0–17.0)
MCH: 31.2 pg (ref 26.0–34.0)
MCHC: 34.7 g/dL (ref 30.0–36.0)
MCV: 90.1 fL (ref 80.0–100.0)
Platelets: 278 10*3/uL (ref 150–400)
RBC: 3.65 MIL/uL — ABNORMAL LOW (ref 4.22–5.81)
RDW: 12.4 % (ref 11.5–15.5)
WBC: 16.7 10*3/uL — ABNORMAL HIGH (ref 4.0–10.5)
nRBC: 0 % (ref 0.0–0.2)

## 2020-12-28 LAB — BASIC METABOLIC PANEL
Anion gap: 9 (ref 5–15)
Anion gap: 8 (ref 5–15)
BUN: 20 mg/dL (ref 8–23)
BUN: 18 mg/dL (ref 8–23)
CO2: 24 mmol/L (ref 22–32)
CO2: 25 mmol/L (ref 22–32)
Calcium: 8.7 mg/dL — ABNORMAL LOW (ref 8.9–10.3)
Calcium: 8.8 mg/dL — ABNORMAL LOW (ref 8.9–10.3)
Chloride: 85 mmol/L — ABNORMAL LOW (ref 98–111)
Chloride: 89 mmol/L — ABNORMAL LOW (ref 98–111)
Creatinine, Ser: 1.34 mg/dL — ABNORMAL HIGH (ref 0.61–1.24)
Creatinine, Ser: 1.3 mg/dL — ABNORMAL HIGH (ref 0.61–1.24)
GFR, Estimated: 56 mL/min — ABNORMAL LOW (ref >60)
GFR, Estimated: 58 mL/min — ABNORMAL LOW (ref >60)
Glucose, Bld: 100 mg/dL — ABNORMAL HIGH (ref 70–99)
Glucose, Bld: 102 mg/dL — ABNORMAL HIGH (ref 70–99)
Potassium: 4.4 mmol/L (ref 3.5–5.1)
Potassium: 4.4 mmol/L (ref 3.5–5.1)
Sodium: 118 mmol/L — CL (ref 135–145)
Sodium: 122 mmol/L — ABNORMAL LOW (ref 135–145)

## 2020-12-28 LAB — PROTIME-INR
INR: 1.1 (ref 0.8–1.2)
Prothrombin Time: 14.2 seconds (ref 11.4–15.2)

## 2020-12-28 MED ORDER — MIDAZOLAM HCL 2 MG/2ML IJ SOLN
INTRAMUSCULAR | Status: AC | PRN
  Administered 2020-12-28 (×2): 0.5 mg via INTRAVENOUS

## 2020-12-28 MED ORDER — SODIUM CHLORIDE 0.9 % IV SOLN
2.0000 g | Freq: Once | INTRAVENOUS | Status: AC
  Administered 2020-12-28: 2 g via INTRAVENOUS
  Filled 2020-12-28: qty 20

## 2020-12-28 MED ORDER — SODIUM CHLORIDE 0.9 % IV SOLN: INTRAVENOUS | Status: DC

## 2020-12-28 MED ORDER — LIDOCAINE HCL 1 % IJ SOLN
INTRAMUSCULAR | Status: AC
  Filled 2020-12-28: qty 20

## 2020-12-28 MED ORDER — HYDROCODONE-ACETAMINOPHEN 5-325 MG PO TABS: 1.0000 | ORAL_TABLET | ORAL | Status: DC | PRN

## 2020-12-28 MED ORDER — FENTANYL CITRATE (PF) 100 MCG/2ML IJ SOLN
INTRAMUSCULAR | Status: AC | PRN
  Administered 2020-12-28 (×2): 25 ug via INTRAVENOUS

## 2020-12-28 MED ORDER — MIDAZOLAM HCL 2 MG/2ML IJ SOLN
INTRAMUSCULAR | Status: AC
  Filled 2020-12-28: qty 4

## 2020-12-28 MED ORDER — SODIUM CHLORIDE 0.9 % IV SOLN
INTRAVENOUS | Status: AC | PRN
  Administered 2020-12-28: 500 mL via INTRAVENOUS

## 2020-12-28 MED ORDER — SODIUM CHLORIDE 0.9% FLUSH: 5.0000 mL | Freq: Three times a day (TID) | INTRAVENOUS | Status: DC

## 2020-12-28 MED ORDER — FENTANYL CITRATE (PF) 100 MCG/2ML IJ SOLN
INTRAMUSCULAR | Status: AC
  Filled 2020-12-28: qty 4

## 2020-12-28 MED ORDER — LIDOCAINE HCL 1 % IJ SOLN
INTRAMUSCULAR | Status: AC | PRN
  Administered 2020-12-28: 10 mL

## 2020-12-28 MED ORDER — IOHEXOL 300 MG/ML  SOLN
50.0000 mL | Freq: Once | INTRAMUSCULAR | Status: AC | PRN
  Administered 2020-12-28: 20 mL

## 2020-12-28 NOTE — H&P (Signed)
Chief Complaint: Patient was seen in consultation today for right percutaneous nephrostomy drain placement at the request of Ross B  Referring Physician(s): Newsome,George B  Supervising Physician: Mir, Biochemist, clinical  Patient Status: [facility] - Out-pt  History of Present Illness: Lance Friedman is a 75 y.o. male   Hx hematuria- admitted 4/19 for TURBT scheduled for 12/22/20 Known Bladder cancer Muscle invasive-- obstructive Rt kidney Post TURBT 12/22/20 Dr Devoria Glassing +Hydronephrosis Foley catheter in place  Request for R PCN asap in IR Pt is here today for same  Dr Dwaine Gale has reviewed imaging and spoke to Dr Milford Cage Planned for PCN today    Past Medical History:  Diagnosis Date  . Arthritis    oa  . Bladder cancer ([place])   . Chronic neck pain   . Gastritis   . GERD (gastroesophageal reflux disease)   . Gout    last flare up fall 2021  . Headache   . Hematuria    started again 12-13-2020  . Hyperlipemia   . Hypothyroidism   . Pre-diabetes   . Vitamin D deficiency   . Wears dentures    full set  . Wears glasses     Past Surgical History:  Procedure Laterality Date  . COLONOSCOPY WITH PROPOFOL N/A 03/21/2016   Procedure: COLONOSCOPY WITH PROPOFOL;  Surgeon: Garlan Fair, MD;  Location: [facility];  Service: Endoscopy;  Laterality: N/A;  . CYSTOSCOPY W/ RETROGRADES Bilateral 12/21/2020   Procedure: CYSTOSCOPY WITH RETROGRADE PYELOGRAM;  Surgeon: [name], MD;  Location: [facility];  Service: Urology;  Laterality: Bilateral;  . ingrown toenail surgery  yrs ago  . MOUTH SURGERY  2015   tooth extraction  . TONSILLECTOMY  as child  . TRANSURETHRAL RESECTION OF BLADDER TUMOR N/A 12/21/2020   Procedure: TRANSURETHRAL RESECTION OF BLADDER TUMOR (TURBT);  Surgeon: [name], MD;  Location: [facility];  Service: Urology;  Laterality: N/A;  15 MINS    Allergies: Aleve [naproxen]  Medications: Prior to Admission  medications   Medication Sig Start Date End Date Taking? Authorizing Provider  acetaminophen (TYLENOL) 500 MG tablet Take 1,000 mg by mouth every 6 (six) hours as needed.   Yes [provider]  co-enzyme Q-10 30 MG capsule Take 30 mg by mouth daily.   Yes [provider]  FENUGREEK PO Take by mouth. 1-2 tabs per day   Yes [provider]  Ginseng 100 MG CAPS Take by mouth. 1-2 capsules daily   Yes [provider]  glucosamine-chondroitin 500-400 MG tablet Take 1 tablet by mouth daily. 3 tabs daily Glucosamine with msm   Yes [provider]  levothyroxine (SYNTHROID) 112 MCG tablet Take 112 mcg by mouth daily before breakfast.   Yes [provider]  Multiple Vitamin (MULTIVITAMIN) tablet Take 1 tablet by mouth daily.   Yes [provider]  NON FORMULARY Achanesia golden seal 2 tabs daily   Yes [provider]  Omega-3 Fatty Acids (FISH OIL PO) Take 1 capsule by mouth daily. Salmon oil 2 -3 tabs daily, 1 in an 2 in pm 1000 mg each   Yes [provider]  omeprazole (PRILOSEC) 20 MG capsule Take 20 mg by mouth 2 (two) times daily before a meal.   Yes [provider]  OVER THE COUNTER MEDICATION Calcium magnesium zinc 3 tabs daily   Yes [provider]  pravastatin (PRAVACHOL) 20 MG tablet Take 20 mg by mouth at bedtime.  Yes [provider]  UNABLE TO FIND Probiotic daily   Yes [provider]  UNABLE TO FIND Advanced super beta prostate  1 tab bid   Yes [provider]  VALERIAN ROOT PO Take by mouth at bedtime as needed.   Yes [provider]  vitamin C (ASCORBIC ACID) 500 MG tablet Take 500 mg by mouth daily. 2 tabs daily   Yes [provider]  VITAMIN E PO Take 1 tablet by mouth daily. 1000 mg daily   Yes [provider]  OVER THE COUNTER MEDICATION Apply 1 application topically daily as needed (icy hot balm to neck prn and right shoulder prn).     [provider]     History reviewed. No pertinent family history.  Social History   Socioeconomic History  . Marital status: Single    Spouse name: Not on file  . Number of children: Not on file  . Years of education: Not on file  . Highest education level: Not on file  Occupational History  . Not on file  Tobacco Use  . Smoking status: Former Smoker    Years: 5.00    Types: Cigarettes    Quit date: 09/05/1983    Years since quitting: 37.3  . Smokeless tobacco: Never Used  . Tobacco comment: social then 2 ppd x 2 years  Vaping Use  . Vaping Use: Never used  Substance and Sexual Activity  . Alcohol use: No    Comment: quit   . Drug use: Yes    Types: Marijuana    Comment: last marijuana use 12 yrs ago  . Sexual activity: Not on file  Other Topics Concern  . Not on file  Social History Narrative  . Not on file   Social Determinants of Health   Financial Resource Strain: Not on file  Food Insecurity: Not on file  Transportation Needs: Not on file  Physical Activity: Not on file  Stress: Not on file  Social Connections: Not on file    Review of Systems: A 12 point ROS discussed and pertinent positives are indicated in the HPI above.  All other systems are negative.  Review of Systems  Constitutional: Positive for activity change. Negative for fatigue and fever.  Respiratory: Negative for cough and shortness of breath.   Cardiovascular: Negative for chest pain.  Gastrointestinal: Positive for abdominal pain and nausea.  Musculoskeletal: Positive for back pain.  Neurological: Positive for weakness.  Psychiatric/Behavioral: Negative for behavioral problems and confusion.    Vital Signs: BP (!) 156/100   Pulse 99   Temp 98 F (36.7 C) (Oral)   Ht 6' (1.829 m)   Wt 210 lb (95.3 kg)   SpO2 98%   BMI 28.48 kg/m   Physical Exam Vitals reviewed.  HENT:     Mouth/Throat:     Mouth: Mucous membranes are moist.  Cardiovascular:     Rate and  Rhythm: Normal rate and regular rhythm.     Heart sounds: Normal heart sounds.  Pulmonary:     Breath sounds: Normal breath sounds.  Abdominal:     Palpations: Abdomen is soft.     Tenderness: There is abdominal tenderness.  Musculoskeletal:        General: Normal range of motion.  Skin:    General: Skin is warm.  Neurological:     Mental Status: He is oriented to person, place, and time.  Psychiatric:        Behavior: Behavior normal.  Imaging: No results found.  Labs:  CBC: No results for input(s): WBC, HGB, HCT, PLT in the last 8760 hours.  COAGS: No results for input(s): INR, APTT in the last 8760 hours.  BMP: No results for input(s): NA, K, CL, CO2, GLUCOSE, BUN, CALCIUM, CREATININE, GFRNONAA, GFRAA in the last 8760 hours.  Invalid input(s): CMP  LIVER FUNCTION TESTS: No results for input(s): BILITOT, AST, ALT, ALKPHOS, PROT, ALBUMIN in the last 8760 hours.  TUMOR MARKERS: No results for input(s): AFPTM, CEA, CA199, CHROMGRNA in the last 8760 hours.  Assessment and Plan:  Bladder cancer Muscle invasion Post TURBT 12/24/20 ++ hydronephrosis per Dr Milford Cage- Urology Scheduled now for R PCN in IR Risks and benefits of Right PCN placement was discussed with the patient including, but not limited to, infection, bleeding, significant bleeding causing loss or decrease in renal function or damage to adjacent structures.   All of the patient's questions were answered, patient is agreeable to proceed. Consent signed and in chart.   Thank you for this interesting consult.  I greatly enjoyed meeting Lance Friedman and look forward to participating in their care.  A copy of this report was sent to the requesting provider on this date.  Electronically Signed: Lavonia Drafts, PA-C 12/28/2020, 10:12 AM   I spent a total of  30 Minutes   in face to face in clinical consultation, greater than 50% of which was counseling/coordinating care for R PCN

## 2020-12-28 NOTE — Sedation Documentation (Signed)
Blood draw for BMP completed patient verbalized understanding and states sometimes his potassium and sodium is lower per his doctor.

## 2020-12-28 NOTE — Sedation Documentation (Signed)
Given patient sandwich meal with soda.

## 2020-12-28 NOTE — Procedures (Signed)
Interventional Radiology Procedure Note  Procedure: Right percutaneous nephrostomy drain  Indication: Right hydronephrosis  Findings: Please refer to procedural dictation for full description.  Complications: None  EBL: < 10 mL  Miachel Roux, MD [phone]

## 2020-12-28 NOTE — Sedation Documentation (Signed)
Per Lennette Bihari PA spoke with Dr Inda Merlin patient PCP able to discharge home. PCP will call patient tomorrow for appointment to recheck labs. Lennette Bihari notified patient verbalized understanding of plan of care.

## 2020-12-28 NOTE — Progress Notes (Signed)
Spoke with Ria Comment, RN, at [facility] regarding pt need for home health post nephrostomy tube placement. She states that pt has an appt on Thursday 4/28 with NP at [facility] and they will get this set up for him. I passed this information along to pt.

## 2020-12-28 NOTE — Sedation Documentation (Signed)
Patient getting clothes on and standing. Nephrostomy bad drainage slight darker in color notified Lennette Bihari PA who assessed patient hs no pain and draining without incident. Notified patient to look for clots or dark cloudy changes. Will follow up with Primary Doctor tomorrow and [facility] Thursday. Given AVS and verbalized understanding of discharge instructions. Assisted patient to exit where friend was waiting by Wheelchair.

## 2020-12-28 NOTE — Discharge Instructions (Signed)
https://www.nursingtimes.net/clinical-archive/patient-safety/nursing-care-and-management-of-patients-[email or web address]-nephrostomy-[email or web address]/">  Percutaneous Nephrostomy, Care After This sheet gives you information about how to care for yourself after your procedure. Your health care provider may also give you more specific instructions. If you have problems or questions, contact your health care provider. What can I expect after the procedure? After the procedure, it is common to have:  Some soreness where the nephrostomy tube was inserted (tube insertion site).  Blood-tinged drainage from the nephrostomy tube for the first 24 hours. Follow these instructions at home: Activity  Do not lift anything that is heavier than 10 lb (4.5 kg), or the limit that you are told, until your health care provider says that it is safe.  Return to your normal activities as told by your health care provider. Ask your health care provider what activities are safe for you.  Avoid activities that may cause the nephrostomy tubing to bend.  Do not take baths, swim, or use a hot tub until your health care provider approves. Ask your health care provider if you can take showers. Cover the nephrostomy tube bandage (dressing) with a watertight covering when you take a shower.  If you were given a sedative during the procedure, it can affect you for several hours. Do not drive or operate machinery until your health care provider says that it is safe.   Care of the tube insertion site  Follow instructions from your health care provider about how to take care of your tube insertion site. Make sure you: ? Wash your hands with soap and water for at least 20 seconds before you change your dressing. If soap and water are not available, use hand sanitizer. ? Change your dressing as told by your health care provider. Be careful not to pull on the tube while removing the dressing. ? When you change the dressing, wash the skin around the  tube, rinse well, and pat the skin dry.  Check the tube insertion area every day for signs of infection. Check for: ? Redness, swelling, or pain. ? Fluid or blood. ? Warmth. ? Pus or a bad smell.   Care of the nephrostomy tube and drainage bag  Always keep the tubing, the leg bag, or the bedside drainage bags below the level of the kidney so that your urine drains freely.  When connecting your nephrostomy tube to a drainage bag, make sure that there are no kinks in the tubing and that your urine is draining freely. You may want to use an elastic bandage to wrap any exposed tubing that goes from the nephrostomy tube to any of the connecting tubes.  At night, you may want to connect your nephrostomy tube or the leg bag to a larger bedside drainage bag.  Follow instructions from your health care provider about how to empty or change the drainage bag.  Empty the drainage bag when it becomes ? full.  Replace the drainage bag and any extension tubing that is connected to your nephrostomy tube every 7 days or as told by your health care provider. Your health care provider will explain how to change the drainage bag and extension tubing. General instructions  Take over-the-counter and prescription medicines only as told by your health care provider.  Keep all follow-up visits as told by your health care provider. This is important. ? The nephrostomy tube will need to be changed every 8-12 weeks. Contact a health care provider if:  You have problems with any of the valves or tubing.  You have persistent pain   or soreness in your back.  You have redness, swelling, or pain around your tube insertion site.  You have fluid or blood coming from your tube insertion site.  Your tube insertion site feels warm to the touch.  You have pus or a bad smell coming from your tube insertion site.  You have increased urine output or you feel burning when urinating. Get help right away if:  You have  pain in your abdomen during the first week.  You have chest pain or have trouble breathing.  You have a new appearance of blood in your urine.  You have a fever or chills.  You have back pain that is not relieved by your medicine.  You have decreased urine output.  Your nephrostomy tube comes out. Summary  After the procedure, it is common to have some soreness where the nephrostomy tube was inserted (tube insertion site).  Follow instructions from your health care provider about how to take care of your tube insertion site, nephrostomy tube, and drainage bag.  Keep all follow-up visits for care and for changing the tube. This information is not intended to replace advice given to you by your health care provider. Make sure you discuss any questions you have with your health care provider. Document Revised: 09/16/2019 Document Reviewed: 09/16/2019 Elsevier Patient Education  2021 [place]. Moderate Conscious Sedation, Adult, Care After This sheet gives you information about how to care for yourself after your procedure. Your health care provider may also give you more specific instructions. If you have problems or questions, contact your health care provider. What can I expect after the procedure? After the procedure, it is common to have:  Sleepiness for several hours.  Impaired judgment for several hours.  Difficulty with balance.  Vomiting if you eat too soon. Follow these instructions at home: For the time period you were told by your health care provider:  Rest.  Do not participate in activities where you could fall or become injured.  Do not drive or use machinery.  Do not drink alcohol.  Do not take sleeping pills or medicines that cause drowsiness.  Do not make important decisions or sign legal documents.  Do not take care of children on your own.      Eating and drinking  Follow the diet recommended by your health care provider.  Drink enough  fluid to keep your urine pale yellow.  If you vomit: ? Drink water, juice, or soup when you can drink without vomiting. ? Make sure you have little or no nausea before eating solid foods.   General instructions  Take over-the-counter and prescription medicines only as told by your health care provider.  Have a responsible adult stay with you for the time you are told. It is important to have someone help care for you until you are awake and alert.  Do not smoke.  Keep all follow-up visits as told by your health care provider. This is important. Contact a health care provider if:  You are still sleepy or having trouble with balance after 24 hours.  You feel light-headed.  You keep feeling nauseous or you keep vomiting.  You develop a rash.  You have a fever.  You have redness or swelling around the IV site. Get help right away if:  You have trouble breathing.  You have new-onset confusion at home. Summary  After the procedure, it is common to feel sleepy, have impaired judgment, or feel nauseous if you eat too  soon.  Rest after you get home. Know the things you should not do after the procedure.  Follow the diet recommended by your health care provider and drink enough fluid to keep your urine pale yellow.  Get help right away if you have trouble breathing or new-onset confusion at home. This information is not intended to replace advice given to you by your health care provider. Make sure you discuss any questions you have with your health care provider. Document Revised: 12/19/2019 Document Reviewed: 07/17/2019 Elsevier Patient Education  2021 [employer].

## 2020-12-28 NOTE — Sedation Documentation (Signed)
PA at bedside talking with patient and will contact his doctor regarding plan of care.

## 2020-12-28 NOTE — Progress Notes (Signed)
Dr Mir notified, critical value-sodium 118. No new orders.

## 2020-12-29 DIAGNOSIS — E871 Hypo-osmolality and hyponatremia: Secondary | ICD-10-CM | POA: Diagnosis not present

## 2020-12-29 DIAGNOSIS — R252 Cramp and spasm: Secondary | ICD-10-CM | POA: Diagnosis not present

## 2020-12-29 DIAGNOSIS — M545 Low back pain, unspecified: Secondary | ICD-10-CM | POA: Diagnosis not present

## 2020-12-29 DIAGNOSIS — N3289 Other specified disorders of bladder: Secondary | ICD-10-CM | POA: Diagnosis not present

## 2020-12-30 DIAGNOSIS — N130 Hydronephrosis with ureteropelvic junction obstruction: Secondary | ICD-10-CM | POA: Diagnosis not present

## 2020-12-30 DIAGNOSIS — C678 Malignant neoplasm of overlapping sites of bladder: Secondary | ICD-10-CM | POA: Diagnosis not present

## 2020-12-30 LAB — URINE CULTURE: Culture: NO GROWTH

## 2021-01-03 DIAGNOSIS — R3915 Urgency of urination: Secondary | ICD-10-CM | POA: Diagnosis not present

## 2021-01-04 DIAGNOSIS — C678 Malignant neoplasm of overlapping sites of bladder: Secondary | ICD-10-CM | POA: Diagnosis not present

## 2021-01-04 DIAGNOSIS — N3000 Acute cystitis without hematuria: Secondary | ICD-10-CM | POA: Diagnosis not present

## 2021-01-04 DIAGNOSIS — R3914 Feeling of incomplete bladder emptying: Secondary | ICD-10-CM | POA: Diagnosis not present

## 2021-01-04 DIAGNOSIS — R8279 Other abnormal findings on microbiological examination of urine: Secondary | ICD-10-CM | POA: Diagnosis not present

## 2021-01-06 DIAGNOSIS — R3914 Feeling of incomplete bladder emptying: Secondary | ICD-10-CM | POA: Diagnosis not present

## 2021-01-06 DIAGNOSIS — N130 Hydronephrosis with ureteropelvic junction obstruction: Secondary | ICD-10-CM | POA: Diagnosis not present

## 2021-01-06 DIAGNOSIS — C678 Malignant neoplasm of overlapping sites of bladder: Secondary | ICD-10-CM | POA: Diagnosis not present

## 2021-01-07 ENCOUNTER — Telehealth: Payer: Self-pay | Admitting: Oncology

## 2021-01-07 NOTE — Telephone Encounter (Signed)
Received a new pt referral from Dr. Tresa Moore at [facility] for bladder cancer. Lance Friedman has been cld and scheduled to see Lance Friedman on 5/10 at 2pm. Pt aware to arrive 20 minutes early.

## 2021-01-11 ENCOUNTER — Other Ambulatory Visit: Payer: Self-pay

## 2021-01-11 ENCOUNTER — Inpatient Hospital Stay: Payer: Medicare Other | Attending: Oncology | Admitting: Oncology

## 2021-01-11 VITALS — BP 123/83 | HR 96 | Temp 98.3°F | Resp 19 | Wt 198.2 lb

## 2021-01-11 DIAGNOSIS — Z5111 Encounter for antineoplastic chemotherapy: Secondary | ICD-10-CM | POA: Insufficient documentation

## 2021-01-11 DIAGNOSIS — Z7189 Other specified counseling: Secondary | ICD-10-CM | POA: Insufficient documentation

## 2021-01-11 DIAGNOSIS — Z79899 Other long term (current) drug therapy: Secondary | ICD-10-CM | POA: Insufficient documentation

## 2021-01-11 DIAGNOSIS — Z87891 Personal history of nicotine dependence: Secondary | ICD-10-CM | POA: Insufficient documentation

## 2021-01-11 DIAGNOSIS — E039 Hypothyroidism, unspecified: Secondary | ICD-10-CM | POA: Insufficient documentation

## 2021-01-11 DIAGNOSIS — I10 Essential (primary) hypertension: Secondary | ICD-10-CM | POA: Insufficient documentation

## 2021-01-11 DIAGNOSIS — C679 Malignant neoplasm of bladder, unspecified: Secondary | ICD-10-CM | POA: Insufficient documentation

## 2021-01-11 DIAGNOSIS — C678 Malignant neoplasm of overlapping sites of bladder: Secondary | ICD-10-CM | POA: Diagnosis not present

## 2021-01-11 MED ORDER — PROCHLORPERAZINE MALEATE 10 MG PO TABS: 10.0000 mg | ORAL_TABLET | Freq: Four times a day (QID) | ORAL | 0 refills | Status: DC | PRN

## 2021-01-11 NOTE — Progress Notes (Signed)

## 2021-01-11 NOTE — Progress Notes (Signed)
Reason for the request:    Bladder cancer  HPI: I was asked by Dr. Tresa Moore to evaluate Mr. Albarran for the evaluation of bladder cancer.  He is a 75 year old man with history of hypertension, hyperlipidemia who presented with hematuria in January 2022.  His evaluation included urine analysis and culture and subsequently an ultrasound of the kidney was obtained which showed an abnormal bladder mass.  CT scan on November 26, 2020 showed a bladder mass without any evidence of metastatic disease.  He was evaluated by  cystoscopy performed showed an exophytic tumor in the right lateral floor of the bladder and impinging on the right ureteral orifice.  He underwent a TURBT on December 21, 2020 and underwent left retrograde pyelogram and resection of a bladder tumor.  The right ureteral orifice could not be identified and subsequently required right percutaneous nephrostomy tube placement on December 28, 2020.  The final pathology from his TURBT showed infiltrating high-grade urothelial carcinoma with muscle invasion.  He was evaluated by Dr. Tresa Moore and felt he would be a reasonable candidate for radical cystectomy he is considering neoadjuvant therapy.  Clinically, he reports feeling well without any major complaints.  He continues to have urinary retention and has a Foley catheter in place with discoloration of the color of his urine.  He denies any flank pain, chest pain or shortness of breath.  He remains active and continues to be living independently.  He does not report any headaches, blurry vision, syncope or seizures. Does not report any fevers, chills or sweats.  Does not report any cough, wheezing or hemoptysis.  Does not report any chest pain, palpitation, orthopnea or leg edema.  Does not report any nausea, vomiting or abdominal pain.  Does not report any constipation or diarrhea.  Does not report any skeletal complaints.    Does not report frequency, urgency or hematuria.  Does not report any skin rashes or  lesions. Does not report any heat or cold intolerance.  Does not report any lymphadenopathy or petechiae.  Does not report any anxiety or depression.  Remaining review of systems is negative.    Past Medical History:  Diagnosis Date  . Arthritis    oa  . Bladder cancer ([place])   . Chronic neck pain   . Gastritis   . GERD (gastroesophageal reflux disease)   . Gout    last flare up fall 2021  . Headache   . Hematuria    started again 12-13-2020  . Hyperlipemia   . Hypothyroidism   . Pre-diabetes   . Vitamin D deficiency   . Wears dentures    full set  . Wears glasses   :  Past Surgical History:  Procedure Laterality Date  . COLONOSCOPY WITH PROPOFOL N/A 03/21/2016   Procedure: COLONOSCOPY WITH PROPOFOL;  Surgeon: Garlan Fair, MD;  Location: [facility];  Service: Endoscopy;  Laterality: N/A;  . CYSTOSCOPY W/ RETROGRADES Bilateral 12/21/2020   Procedure: CYSTOSCOPY WITH RETROGRADE PYELOGRAM;  Surgeon: [name], MD;  Location: [facility];  Service: Urology;  Laterality: Bilateral;  . ingrown toenail surgery  yrs ago  . IR NEPHROSTOMY PLACEMENT RIGHT  12/28/2020  . MOUTH SURGERY  2015   tooth extraction  . TONSILLECTOMY  as child  . TRANSURETHRAL RESECTION OF BLADDER TUMOR N/A 12/21/2020   Procedure: TRANSURETHRAL RESECTION OF BLADDER TUMOR (TURBT);  Surgeon: [name], MD;  Location[facility];  Service: Urology;  Laterality: N/A;  45 MINS  :  Current Outpatient Medications:  .  acetaminophen (TYLENOL) 500 MG tablet, Take 1,000 mg by mouth every 6 (six) hours as needed., Disp: , Rfl:  .  co-enzyme Q-10 30 MG capsule, Take 30 mg by mouth daily., Disp: , Rfl:  .  FENUGREEK PO, Take by mouth. 1-2 tabs per day, Disp: , Rfl:  .  Ginseng 100 MG CAPS, Take by mouth. 1-2 capsules daily, Disp: , Rfl:  .  glucosamine-chondroitin 500-400 MG tablet, Take 1 tablet by mouth daily. 3 tabs daily Glucosamine with msm, Disp: , Rfl:  .   levothyroxine (SYNTHROID) 112 MCG tablet, Take 112 mcg by mouth daily before breakfast., Disp: , Rfl:  .  Multiple Vitamin (MULTIVITAMIN) tablet, Take 1 tablet by mouth daily., Disp: , Rfl:  .  NON FORMULARY, Achanesia golden seal 2 tabs daily, Disp: , Rfl:  .  Omega-3 Fatty Acids (FISH OIL PO), Take 1 capsule by mouth daily. Salmon oil 2 -3 tabs daily, 1 in an 2 in pm 1000 mg each, Disp: , Rfl:  .  omeprazole (PRILOSEC) 20 MG capsule, Take 20 mg by mouth 2 (two) times daily before a meal., Disp: , Rfl:  .  OVER THE COUNTER MEDICATION, Apply 1 application topically daily as needed (icy hot balm to neck prn and right shoulder prn)., Disp: , Rfl:  .  OVER THE COUNTER MEDICATION, Calcium magnesium zinc 3 tabs daily, Disp: , Rfl:  .  pravastatin (PRAVACHOL) 20 MG tablet, Take 20 mg by mouth at bedtime. , Disp: , Rfl:  .  UNABLE TO FIND, Probiotic daily, Disp: , Rfl:  .  UNABLE TO FIND, Advanced super beta prostate  1 tab bid, Disp: , Rfl:  .  VALERIAN ROOT PO, Take by mouth at bedtime as needed., Disp: , Rfl:  .  vitamin C (ASCORBIC ACID) 500 MG tablet, Take 500 mg by mouth daily. 2 tabs daily, Disp: , Rfl:  .  VITAMIN E PO, Take 1 tablet by mouth daily. 1000 mg daily, Disp: , Rfl: :  Allergies  Allergen Reactions  . Aleve [Naproxen]      Cannot take Naproxen sodium due to past stomach damage   :  No family history on file.:  Social History   Socioeconomic History  . Marital status: Single    Spouse name: Not on file  . Number of children: Not on file  . Years of education: Not on file  . Highest education level: Not on file  Occupational History  . Not on file  Tobacco Use  . Smoking status: Former Smoker    Years: 5.00    Types: Cigarettes    Quit date: 09/05/1983    Years since quitting: 37.3  . Smokeless tobacco: Never Used  . Tobacco comment: social then 2 ppd x 2 years  Vaping Use  . Vaping Use: Never used  Substance and Sexual Activity  . Alcohol use: No    Comment: quit    . Drug use: Yes    Types: Marijuana    Comment: last marijuana use 12 yrs ago  . Sexual activity: Not on file  Other Topics Concern  . Not on file  Social History Narrative  . Not on file   Social Determinants of Health   Financial Resource Strain: Not on file  Food Insecurity: Not on file  Transportation Needs: Not on file  Physical Activity: Not on file  Stress: Not on file  Social Connections: Not on file  Intimate Partner Violence: Not  on file  :  Pertinent items are noted in HPI.  Exam: Blood pressure 123/83, pulse 96, temperature 98.3 F (36.8 C), temperature source Tympanic, resp. rate 19, weight 198 lb 3.2 oz (89.9 kg), SpO2 96 %.  ECOG 0  General appearance: alert and cooperative appeared without distress. Head: atraumatic without any abnormalities. Eyes: conjunctivae/corneas clear. PERRL.  Sclera anicteric. Throat: lips, mucosa, and tongue normal; without oral thrush or ulcers. Resp: clear to auscultation bilaterally without rhonchi, wheezes or dullness to percussion. Cardio: regular rate and rhythm, S1, S2 normal, no murmur, click, rub or gallop GI: soft, non-tender; bowel sounds normal; no masses,  no organomegaly Skin: Skin color, texture, turgor normal. No rashes or lesions Lymph nodes: Cervical, supraclavicular, and axillary nodes normal. Neurologic: Grossly normal without any motor, sensory or deep tendon reflexes. Musculoskeletal: No joint deformity or effusion.    IR NEPHROSTOMY PLACEMENT RIGHT  Result Date: 12/28/2020 INDICATION: 75 year old gentleman with history of bladder cancer presents to interventional radiology with severe right flank pain and right hydronephrosis. EXAM: Ultrasound and fluoroscopic guided right percutaneous nephrostomy drain placement COMPARISON:  None. MEDICATIONS: Rocephin 2 g IV; The antibiotic was administered in an appropriate time frame prior to skin puncture. ANESTHESIA/SEDATION: Fentanyl 50 mcg IV; Versed 1 mg IV  Moderate Sedation Time:  19 minutes The patient was continuously monitored during the procedure by the interventional radiology nurse under my direct supervision. CONTRAST:  20 mL Omnipaque 300-administered into the collecting system(s) FLUOROSCOPY TIME:  Fluoroscopy Time: 1 minutes 0 seconds (6 mGy). COMPLICATIONS: None immediate. PROCEDURE: Informed written consent was obtained from the patient after a thorough discussion of the procedural risks, benefits and alternatives. All questions were addressed. Maximal Sterile Barrier Technique was utilized including caps, mask, sterile gowns, sterile gloves, sterile drape, hand hygiene and skin antiseptic. A timeout was performed prior to the initiation of the procedure. Patient position prone on the procedure table. Right flank skin prepped and draped in usual fashion. Following local lidocaine administration, upper pole calyx of the right kidney was accessed with a 21 gauge needle. Urine return was noted. The 21 gauge needle was exchanged for a transitional dilator set over 0.018 inch guidewire. Transitional dilator set exchanged for 10.2 [place] multipurpose pigtail drain over 0.035 inch guidewire. Pigtail formed in the renal pelvis. Approximately 100 mL of bloody material was aspirated. Samples were sent for Gram stain and culture. Drain secured to skin with suture and connected to bag. IMPRESSION: 10.2 [place]) nephrostomy drain placed using fluoroscopic and ultrasound guidance. PLAN: Patient's sodium was found to be decreased at 118 prior to the procedure. Following normal saline bolus, and improved to 122. Sodium level was discussed with patient's primary care physician Dr. [name]. Dr. Inda Merlin recommended the patient be discharged if he is asymptomatic, which the patient was. Dr. Moses Manners office will contact the patient in the morning and follow-up on labs. Electronically Signed   By: Miachel Roux M.D.   On: 12/28/2020 16:17    Assessment and Plan:     75 year old man with:  1.  Bladder cancer diagnosed and April 2022.  He was found to have a large tumor and underwent surgical resection with TURBT on December 21, 2020 showed high-grade papillary urothelial carcinoma with muscle invasion.  No evidence of metastatic disease noted on imaging studies.  The natural course of this disease and treatment options were reviewed at this time.  Radical cystectomy after neoadjuvant chemotherapy is a reasonable option remains one of the standard of care options.  Trial modality approach with chemoradiation as a definitive modality is also considered.  He understands that neoadjuvant chemotherapy is the standard of care proceeding radical cystectomy.  The logistics and rationale for using chemotherapy was reviewed today in detail.  Complication associated with cisplatin and gemcitabine chemotherapy was discussed.  These complications include nausea, vomiting, myelosuppression, fatigue, infusion related complications, renal insufficiency, neutropenia, neutropenic sepsis and rarely serious thrombosis, hospitalization and death.  The benefit would also if he has an excellent response to chemotherapy, curative surgical resection may be attempted.  The plan is to treat with gemcitabine and cisplatin on day 1, gemcitabine day 8 out of a 21-day cycle.  Anticipate needing 4 cycles of therapy    After discussion today, he is agreeable to proceed after chemo education class.     2.  IV access: Risks and benefits of using Port-A-Cath versus peripheral veins was discussed today.  Complication associated with Port-A-Cath insertion include bleeding, infection and thrombosis.  After discussing the risks and benefits, he opted to proceed with his veins peripherally and defer a Port-A-Cath for the time being.   3.  Antiemetics: Prescription for Compazine was made available to him.   4.  Renal function surveillance:  His a creatinine clearance is close to 60 cc/min with  nephrostomy tube in place.   5.  Goals of care:  Therapy is curative at this time.   6.  Follow-up: will be in the immediate future to start chemotherapy.   60  minutes were dedicated to this visit. The time was spent on reviewing laboratory data, imaging studies, discussing treatment options, and answering questions regarding future plan.     A copy of this consult has been forwarded to the requesting physician.

## 2021-01-13 ENCOUNTER — Telehealth: Payer: Self-pay | Admitting: *Deleted

## 2021-01-13 NOTE — Telephone Encounter (Signed)
Mr. Ybarra called office - concerned about appointments. Stated he did not see any infusion appointments until June on Mychart and he thought Dr. Alen Blew wanted to start treatment soon. Informed him that Dr. Alen Blew did request infusion appts on 5/19 and 5/26 but they will be scheduled in the next few days. Advised him to keep checking MyChart or to contact office tomorrow or Monday if appointments not scheduled by then. He verbalized understanding.

## 2021-01-14 ENCOUNTER — Other Ambulatory Visit: Payer: Self-pay

## 2021-01-14 ENCOUNTER — Inpatient Hospital Stay: Payer: Medicare Other

## 2021-01-17 ENCOUNTER — Telehealth: Payer: Self-pay | Admitting: Oncology

## 2021-01-17 NOTE — Telephone Encounter (Signed)
Scheduled per 05/10 los, patient has been called and notified of upcoming appointments.

## 2021-01-20 ENCOUNTER — Other Ambulatory Visit: Payer: Self-pay

## 2021-01-20 ENCOUNTER — Inpatient Hospital Stay: Payer: Medicare Other

## 2021-01-20 VITALS — BP 118/80 | HR 87 | Temp 98.0°F | Resp 20

## 2021-01-20 DIAGNOSIS — C678 Malignant neoplasm of overlapping sites of bladder: Secondary | ICD-10-CM

## 2021-01-20 DIAGNOSIS — E039 Hypothyroidism, unspecified: Secondary | ICD-10-CM | POA: Diagnosis not present

## 2021-01-20 DIAGNOSIS — I10 Essential (primary) hypertension: Secondary | ICD-10-CM | POA: Diagnosis not present

## 2021-01-20 DIAGNOSIS — Z79899 Other long term (current) drug therapy: Secondary | ICD-10-CM | POA: Diagnosis not present

## 2021-01-20 DIAGNOSIS — Z87891 Personal history of nicotine dependence: Secondary | ICD-10-CM | POA: Diagnosis not present

## 2021-01-20 DIAGNOSIS — Z5111 Encounter for antineoplastic chemotherapy: Secondary | ICD-10-CM | POA: Diagnosis not present

## 2021-01-20 DIAGNOSIS — C679 Malignant neoplasm of bladder, unspecified: Secondary | ICD-10-CM | POA: Diagnosis not present

## 2021-01-20 LAB — CBC WITH DIFFERENTIAL (CANCER CENTER ONLY)
Abs Immature Granulocytes: 0.04 10*3/uL (ref 0.00–0.07)
Basophils Absolute: 0 10*3/uL (ref 0.0–0.1)
Basophils Relative: 0 %
Eosinophils Absolute: 0.2 10*3/uL (ref 0.0–0.5)
Eosinophils Relative: 2 %
HCT: 31.2 % — ABNORMAL LOW (ref 39.0–52.0)
Hemoglobin: 10.6 g/dL — ABNORMAL LOW (ref 13.0–17.0)
Immature Granulocytes: 1 %
Lymphocytes Relative: 13 %
Lymphs Abs: 1.1 10*3/uL (ref 0.7–4.0)
MCH: 31.4 pg (ref 26.0–34.0)
MCHC: 34 g/dL (ref 30.0–36.0)
MCV: 92.3 fL (ref 80.0–100.0)
Monocytes Absolute: 1.4 10*3/uL — ABNORMAL HIGH (ref 0.1–1.0)
Monocytes Relative: 17 %
Neutro Abs: 5.9 10*3/uL (ref 1.7–7.7)
Neutrophils Relative %: 67 %
Platelet Count: 267 10*3/uL (ref 150–400)
RBC: 3.38 MIL/uL — ABNORMAL LOW (ref 4.22–5.81)
RDW: 12.9 % (ref 11.5–15.5)
WBC Count: 8.6 10*3/uL (ref 4.0–10.5)
nRBC: 0 % (ref 0.0–0.2)

## 2021-01-20 LAB — CMP (CANCER CENTER ONLY)
ALT: 15 U/L (ref 0–44)
AST: 17 U/L (ref 15–41)
Albumin: 3.9 g/dL (ref 3.5–5.0)
Alkaline Phosphatase: 66 U/L (ref 38–126)
Anion gap: 6 (ref 5–15)
BUN: 12 mg/dL (ref 8–23)
CO2: 31 mmol/L (ref 22–32)
Calcium: 9.6 mg/dL (ref 8.9–10.3)
Chloride: 93 mmol/L — ABNORMAL LOW (ref 98–111)
Creatinine: 0.91 mg/dL (ref 0.61–1.24)
GFR, Estimated: >60 mL/min (ref >60)
Glucose, Bld: 98 mg/dL (ref 70–99)
Potassium: 4.1 mmol/L (ref 3.5–5.1)
Sodium: 130 mmol/L — ABNORMAL LOW (ref 135–145)
Total Bilirubin: 0.6 mg/dL (ref 0.3–1.2)
Total Protein: 6.8 g/dL (ref 6.5–8.1)

## 2021-01-20 MED ORDER — SODIUM CHLORIDE 0.9 % IV SOLN
Freq: Once | INTRAVENOUS | Status: AC
  Filled 2021-01-20: qty 250

## 2021-01-20 MED ORDER — SODIUM CHLORIDE 0.9 % IV SOLN
Freq: Once | INTRAVENOUS | Status: AC
Start: 2021-01-20 — End: 2021-01-20
  Filled 2021-01-20: qty 250

## 2021-01-20 MED ORDER — MAGNESIUM SULFATE 2 GM/50ML IV SOLN
2.0000 g | Freq: Once | INTRAVENOUS | Status: AC
Start: 2021-01-20 — End: 2021-01-20
  Administered 2021-01-20: 2 g via INTRAVENOUS
  Filled 2021-01-20: qty 50

## 2021-01-20 MED ORDER — SODIUM CHLORIDE 0.9 % IV SOLN
70.0000 mg/m2 | Freq: Once | INTRAVENOUS | Status: AC
  Administered 2021-01-20: 150 mg via INTRAVENOUS
  Filled 2021-01-20: qty 138

## 2021-01-20 MED ORDER — POTASSIUM CHLORIDE IN NACL 20-0.9 MEQ/L-% IV SOLN
Freq: Once | INTRAVENOUS | Status: AC
Start: 2021-01-20 — End: 2021-01-20
  Filled 2021-01-20: qty 1000

## 2021-01-20 MED ORDER — SODIUM CHLORIDE 0.9 % IV SOLN
150.0000 mg | Freq: Once | INTRAVENOUS | Status: AC
  Administered 2021-01-20: 150 mg via INTRAVENOUS
  Filled 2021-01-20: qty 150

## 2021-01-20 MED ORDER — PALONOSETRON HCL INJECTION 0.25 MG/5ML
INTRAVENOUS | Status: AC
  Filled 2021-01-20: qty 5

## 2021-01-20 MED ORDER — PALONOSETRON HCL INJECTION 0.25 MG/5ML
0.2500 mg | Freq: Once | INTRAVENOUS | Status: AC
  Administered 2021-01-20: 0.25 mg via INTRAVENOUS

## 2021-01-20 MED ORDER — SODIUM CHLORIDE 0.9 % IV SOLN
10.0000 mg | Freq: Once | INTRAVENOUS | Status: AC
  Administered 2021-01-20: 10 mg via INTRAVENOUS
  Filled 2021-01-20: qty 10

## 2021-01-20 MED ORDER — SODIUM CHLORIDE 0.9 % IV SOLN
1000.0000 mg/m2 | Freq: Once | INTRAVENOUS | Status: AC
  Administered 2021-01-20: 2128 mg via INTRAVENOUS
  Filled 2021-01-20: qty 52.6

## 2021-01-20 NOTE — Patient Instructions (Signed)
[facility]  Discharge Instructions: Thank you for choosing [facility] to provide your oncology and hematology care.   If you have a lab appointment with the [facility], please go directly to the [facility] and check in at the registration area.  Wear comfortable clothing and clothing appropriate for easy access to any Portacath or PICC line.   We strive to give you quality time with your provider. You may need to reschedule your appointment if you arrive late (15 or more minutes).  Arriving late affects you and other patients whose appointments are after yours.  Also, if you miss three or more appointments without notifying the office, you may be dismissed from the clinic at the provider's discretion.      For prescription refill requests, have your pharmacy contact our office and allow 72 hours for refills to be completed.    Today you received the following chemotherapy and/or immunotherapy agents Cisplatin, Gemzar      To help prevent nausea and vomiting after your treatment, we encourage you to take your nausea medication as directed.  Take Compazine 10 mg every 6 hours as needed for nausea.  BELOW ARE SYMPTOMS THAT SHOULD BE REPORTED IMMEDIATELY: . *FEVER GREATER THAN 100.4 F (38 C) OR HIGHER . *CHILLS OR SWEATING . *NAUSEA AND VOMITING THAT IS NOT CONTROLLED WITH YOUR NAUSEA MEDICATION . *UNUSUAL SHORTNESS OF BREATH . *UNUSUAL BRUISING OR BLEEDING . *URINARY PROBLEMS (pain or burning when urinating, or frequent urination) . *BOWEL PROBLEMS (unusual diarrhea, constipation, pain near the anus) . TENDERNESS IN MOUTH AND THROAT WITH OR WITHOUT PRESENCE OF ULCERS (sore throat, sores in mouth, or a toothache) . UNUSUAL RASH, SWELLING OR PAIN  . UNUSUAL VAGINAL DISCHARGE OR ITCHING   Items with * indicate a potential emergency and should be followed up as soon as possible or go to the Emergency Department if any problems should  occur.  Please show the CHEMOTHERAPY ALERT CARD or IMMUNOTHERAPY ALERT CARD at check-in to the Emergency Department and triage nurse. Should you have questions after your visit or need to cancel or reschedule your appointment, please contact [facility]  [phone] and follow the prompts.  Office hours are 8:00 a.m. to 4:30 p.m. Monday - Friday. Please note that voicemails left after 4:00 p.m. may not be returned until the following business day.  We are closed weekends and major holidays. You have access to a nurse at all times for urgent questions. Please call the main number to the clinic [phone] and follow the prompts.  For any non-urgent questions, you may also contact your provider using MyChart. We now offer e-Visits for anyone 29 and older to request care online for non-urgent symptoms. For details visit [email or web address].   Also download the MyChart app! Go to the app store, search "MyChart", open the app, select [facility], and log in with your MyChart username and password.  Due to Covid, a mask is required upon entering the hospital/clinic. If you do not have a mask, one will be given to you upon arrival. For doctor visits, patients may have 1 support person aged 32 or older with them. For treatment visits, patients cannot have anyone with them due to current Covid guidelines and our immunocompromised population.

## 2021-01-21 ENCOUNTER — Telehealth: Payer: Self-pay | Admitting: *Deleted

## 2021-01-25 ENCOUNTER — Telehealth: Payer: Self-pay | Admitting: *Deleted

## 2021-01-27 ENCOUNTER — Inpatient Hospital Stay: Payer: Medicare Other

## 2021-01-27 ENCOUNTER — Other Ambulatory Visit: Payer: Self-pay

## 2021-01-27 VITALS — BP 133/70 | HR 94 | Temp 98.4°F | Resp 17

## 2021-01-27 DIAGNOSIS — Z87891 Personal history of nicotine dependence: Secondary | ICD-10-CM | POA: Diagnosis not present

## 2021-01-27 DIAGNOSIS — C678 Malignant neoplasm of overlapping sites of bladder: Secondary | ICD-10-CM

## 2021-01-27 DIAGNOSIS — I10 Essential (primary) hypertension: Secondary | ICD-10-CM | POA: Diagnosis not present

## 2021-01-27 DIAGNOSIS — Z5111 Encounter for antineoplastic chemotherapy: Secondary | ICD-10-CM | POA: Diagnosis not present

## 2021-01-27 DIAGNOSIS — Z79899 Other long term (current) drug therapy: Secondary | ICD-10-CM | POA: Diagnosis not present

## 2021-01-27 DIAGNOSIS — E039 Hypothyroidism, unspecified: Secondary | ICD-10-CM | POA: Diagnosis not present

## 2021-01-27 DIAGNOSIS — C679 Malignant neoplasm of bladder, unspecified: Secondary | ICD-10-CM | POA: Diagnosis not present

## 2021-01-27 LAB — CMP (CANCER CENTER ONLY)
ALT: 35 U/L (ref 0–44)
AST: 18 U/L (ref 15–41)
Albumin: 4 g/dL (ref 3.5–5.0)
Alkaline Phosphatase: 70 U/L (ref 38–126)
Anion gap: 8 (ref 5–15)
BUN: 15 mg/dL (ref 8–23)
CO2: 29 mmol/L (ref 22–32)
Calcium: 9.5 mg/dL (ref 8.9–10.3)
Chloride: 90 mmol/L — ABNORMAL LOW (ref 98–111)
Creatinine: 0.99 mg/dL (ref 0.61–1.24)
GFR, Estimated: >60 mL/min (ref >60)
Glucose, Bld: 103 mg/dL — ABNORMAL HIGH (ref 70–99)
Potassium: 3.4 mmol/L — ABNORMAL LOW (ref 3.5–5.1)
Sodium: 127 mmol/L — ABNORMAL LOW (ref 135–145)
Total Bilirubin: 0.5 mg/dL (ref 0.3–1.2)
Total Protein: 6.9 g/dL (ref 6.5–8.1)

## 2021-01-27 LAB — CBC WITH DIFFERENTIAL (CANCER CENTER ONLY)
Abs Immature Granulocytes: 0.03 10*3/uL (ref 0.00–0.07)
Basophils Absolute: 0 10*3/uL (ref 0.0–0.1)
Basophils Relative: 0 %
Eosinophils Absolute: 0 10*3/uL (ref 0.0–0.5)
Eosinophils Relative: 0 %
HCT: 28.2 % — ABNORMAL LOW (ref 39.0–52.0)
Hemoglobin: 9.5 g/dL — ABNORMAL LOW (ref 13.0–17.0)
Immature Granulocytes: 1 %
Lymphocytes Relative: 15 %
Lymphs Abs: 0.7 10*3/uL (ref 0.7–4.0)
MCH: 30.8 pg (ref 26.0–34.0)
MCHC: 33.7 g/dL (ref 30.0–36.0)
MCV: 91.6 fL (ref 80.0–100.0)
Monocytes Absolute: 0.3 10*3/uL (ref 0.1–1.0)
Monocytes Relative: 6 %
Neutro Abs: 4 10*3/uL (ref 1.7–7.7)
Neutrophils Relative %: 78 %
Platelet Count: 137 10*3/uL — ABNORMAL LOW (ref 150–400)
RBC: 3.08 MIL/uL — ABNORMAL LOW (ref 4.22–5.81)
RDW: 12.5 % (ref 11.5–15.5)
WBC Count: 5.1 10*3/uL (ref 4.0–10.5)
nRBC: 0 % (ref 0.0–0.2)

## 2021-01-27 MED ORDER — SODIUM CHLORIDE 0.9 % IV SOLN
Freq: Once | INTRAVENOUS | Status: AC
  Filled 2021-01-27: qty 250

## 2021-01-27 MED ORDER — PROCHLORPERAZINE MALEATE 10 MG PO TABS
10.0000 mg | ORAL_TABLET | Freq: Once | ORAL | Status: AC
Start: 2021-01-27 — End: 2021-01-27
  Administered 2021-01-27: 10 mg via ORAL

## 2021-01-27 MED ORDER — SODIUM CHLORIDE 0.9 % IV SOLN
Freq: Once | INTRAVENOUS | Status: DC
  Filled 2021-01-27: qty 250

## 2021-01-27 MED ORDER — SODIUM CHLORIDE 0.9 % IV SOLN
1000.0000 mg/m2 | Freq: Once | INTRAVENOUS | Status: AC
  Administered 2021-01-27: 2128 mg via INTRAVENOUS
  Filled 2021-01-27: qty 52.6

## 2021-01-27 MED ORDER — PROCHLORPERAZINE MALEATE 10 MG PO TABS
ORAL_TABLET | ORAL | Status: AC
  Filled 2021-01-27: qty 1

## 2021-01-27 NOTE — Patient Instructions (Signed)
[facility]  Discharge Instructions: Thank you for choosing [facility] to provide your oncology and hematology care.   If you have a lab appointment with the Cancer Center, please go directly to the Cancer Center and check in at the registration area.  Wear comfortable clothing and clothing appropriate for easy access to any Portacath or PICC line.   We strive to give you quality time with your provider. You may need to reschedule your appointment if you arrive late (15 or more minutes).  Arriving late affects you and other patients whose appointments are after yours.  Also, if you miss three or more appointments without notifying the office, you may be dismissed from the clinic at the provider's discretion.      For prescription refill requests, have your pharmacy contact our office and allow 72 hours for refills to be completed.    Today you received the following chemotherapy and/or immunotherapy agents Gemzar       To help prevent nausea and vomiting after your treatment, we encourage you to take your nausea medication as directed.  BELOW ARE SYMPTOMS THAT SHOULD BE REPORTED IMMEDIATELY: *FEVER GREATER THAN 100.4 F (38 C) OR HIGHER *CHILLS OR SWEATING *NAUSEA AND VOMITING THAT IS NOT CONTROLLED WITH YOUR NAUSEA MEDICATION *UNUSUAL SHORTNESS OF BREATH *UNUSUAL BRUISING OR BLEEDING *URINARY PROBLEMS (pain or burning when urinating, or frequent urination) *BOWEL PROBLEMS (unusual diarrhea, constipation, pain near the anus) TENDERNESS IN MOUTH AND THROAT WITH OR WITHOUT PRESENCE OF ULCERS (sore throat, sores in mouth, or a toothache) UNUSUAL RASH, SWELLING OR PAIN  UNUSUAL VAGINAL DISCHARGE OR ITCHING   Items with * indicate a potential emergency and should be followed up as soon as possible or go to the Emergency Department if any problems should occur.  Please show the CHEMOTHERAPY ALERT CARD or IMMUNOTHERAPY ALERT CARD at check-in to the  Emergency Department and triage nurse. Should you have questions after your visit or need to cancel or reschedule your appointment, please contact [facility]  [phone] and follow the prompts.  Office hours are 8:00 a.m. to 4:30 p.m. Monday - Friday. Please note that voicemails left after 4:00 p.m. may not be returned until the following business day.  We are closed weekends and major holidays. You have access to a nurse at all times for urgent questions. Please call the main number to the clinic [phone] and follow the prompts.  For any non-urgent questions, you may also contact your provider using MyChart. We now offer e-Visits for anyone 18 and older to request care online for non-urgent symptoms. For details visit [email or web address].   Also download the MyChart app! Go to the app store, search "MyChart", open the app, select , and log in with your MyChart username and password.  Due to Covid, a mask is required upon entering the hospital/clinic. If you do not have a mask, one will be given to you upon arrival. For doctor visits, patients may have 1 support person aged 18 or older with them. For treatment visits, patients cannot have anyone with them due to current Covid guidelines and our immunocompromised population.  

## 2021-02-02 DIAGNOSIS — K5900 Constipation, unspecified: Secondary | ICD-10-CM | POA: Diagnosis not present

## 2021-02-02 DIAGNOSIS — M545 Low back pain, unspecified: Secondary | ICD-10-CM | POA: Diagnosis not present

## 2021-02-02 DIAGNOSIS — R252 Cramp and spasm: Secondary | ICD-10-CM | POA: Diagnosis not present

## 2021-02-02 DIAGNOSIS — E871 Hypo-osmolality and hyponatremia: Secondary | ICD-10-CM | POA: Diagnosis not present

## 2021-02-02 DIAGNOSIS — N3289 Other specified disorders of bladder: Secondary | ICD-10-CM | POA: Diagnosis not present

## 2021-02-07 DIAGNOSIS — R3914 Feeling of incomplete bladder emptying: Secondary | ICD-10-CM | POA: Diagnosis not present

## 2021-02-10 ENCOUNTER — Other Ambulatory Visit: Payer: Self-pay

## 2021-02-10 ENCOUNTER — Inpatient Hospital Stay: Payer: Medicare Other

## 2021-02-10 ENCOUNTER — Inpatient Hospital Stay: Payer: Medicare Other | Attending: Oncology | Admitting: Oncology

## 2021-02-10 VITALS — HR 97

## 2021-02-10 VITALS — BP 139/83 | HR 114 | Temp 97.0°F | Resp 20 | Ht 72.0 in | Wt 197.6 lb

## 2021-02-10 DIAGNOSIS — C678 Malignant neoplasm of overlapping sites of bladder: Secondary | ICD-10-CM

## 2021-02-10 DIAGNOSIS — Z5111 Encounter for antineoplastic chemotherapy: Secondary | ICD-10-CM | POA: Insufficient documentation

## 2021-02-10 DIAGNOSIS — Z79899 Other long term (current) drug therapy: Secondary | ICD-10-CM | POA: Insufficient documentation

## 2021-02-10 DIAGNOSIS — C679 Malignant neoplasm of bladder, unspecified: Secondary | ICD-10-CM | POA: Diagnosis not present

## 2021-02-10 LAB — CBC WITH DIFFERENTIAL (CANCER CENTER ONLY)
Abs Immature Granulocytes: 0.78 10*3/uL — ABNORMAL HIGH (ref 0.00–0.07)
Basophils Absolute: 0.1 10*3/uL (ref 0.0–0.1)
Basophils Relative: 1 %
Eosinophils Absolute: 0.1 10*3/uL (ref 0.0–0.5)
Eosinophils Relative: 1 %
HCT: 28.2 % — ABNORMAL LOW (ref 39.0–52.0)
Hemoglobin: 9.4 g/dL — ABNORMAL LOW (ref 13.0–17.0)
Immature Granulocytes: 8 %
Lymphocytes Relative: 15 %
Lymphs Abs: 1.4 10*3/uL (ref 0.7–4.0)
MCH: 30.4 pg (ref 26.0–34.0)
MCHC: 33.3 g/dL (ref 30.0–36.0)
MCV: 91.3 fL (ref 80.0–100.0)
Monocytes Absolute: 1.7 10*3/uL — ABNORMAL HIGH (ref 0.1–1.0)
Monocytes Relative: 18 %
Neutro Abs: 5.7 10*3/uL (ref 1.7–7.7)
Neutrophils Relative %: 57 %
Platelet Count: 844 10*3/uL — ABNORMAL HIGH (ref 150–400)
RBC: 3.09 MIL/uL — ABNORMAL LOW (ref 4.22–5.81)
RDW: 13.8 % (ref 11.5–15.5)
WBC Count: 9.8 10*3/uL (ref 4.0–10.5)
nRBC: 0 % (ref 0.0–0.2)

## 2021-02-10 LAB — CMP (CANCER CENTER ONLY)
ALT: 21 U/L (ref 0–44)
AST: 20 U/L (ref 15–41)
Albumin: 3 g/dL — ABNORMAL LOW (ref 3.5–5.0)
Alkaline Phosphatase: 75 U/L (ref 38–126)
Anion gap: 12 (ref 5–15)
BUN: 9 mg/dL (ref 8–23)
CO2: 24 mmol/L (ref 22–32)
Calcium: 9.2 mg/dL (ref 8.9–10.3)
Chloride: 97 mmol/L — ABNORMAL LOW (ref 98–111)
Creatinine: 1.12 mg/dL (ref 0.61–1.24)
GFR, Estimated: >60 mL/min (ref >60)
Glucose, Bld: 166 mg/dL — ABNORMAL HIGH (ref 70–99)
Potassium: 4.1 mmol/L (ref 3.5–5.1)
Sodium: 133 mmol/L — ABNORMAL LOW (ref 135–145)
Total Bilirubin: <0.2 mg/dL — ABNORMAL LOW (ref 0.3–1.2)
Total Protein: 6.8 g/dL (ref 6.5–8.1)

## 2021-02-10 MED ORDER — PALONOSETRON HCL INJECTION 0.25 MG/5ML
0.2500 mg | Freq: Once | INTRAVENOUS | Status: AC
  Administered 2021-02-10: 0.25 mg via INTRAVENOUS

## 2021-02-10 MED ORDER — POTASSIUM CHLORIDE IN NACL 20-0.9 MEQ/L-% IV SOLN
Freq: Once | INTRAVENOUS | Status: AC
  Filled 2021-02-10: qty 1000

## 2021-02-10 MED ORDER — SODIUM CHLORIDE 0.9 % IV SOLN
10.0000 mg | Freq: Once | INTRAVENOUS | Status: AC
  Administered 2021-02-10: 10 mg via INTRAVENOUS
  Filled 2021-02-10: qty 10

## 2021-02-10 MED ORDER — CISPLATIN CHEMO INJECTION 100MG/100ML
70.0000 mg/m2 | Freq: Once | INTRAVENOUS | Status: AC
  Administered 2021-02-10: 150 mg via INTRAVENOUS
  Filled 2021-02-10: qty 150

## 2021-02-10 MED ORDER — DIPHENHYDRAMINE HCL 50 MG/ML IJ SOLN
INTRAMUSCULAR | Status: AC
  Filled 2021-02-10: qty 1

## 2021-02-10 MED ORDER — SODIUM CHLORIDE 0.9 % IV SOLN
Freq: Once | INTRAVENOUS | Status: AC
Start: 2021-02-10 — End: 2021-02-10
  Filled 2021-02-10: qty 250

## 2021-02-10 MED ORDER — SODIUM CHLORIDE 0.9 % IV SOLN
1000.0000 mg/m2 | Freq: Once | INTRAVENOUS | Status: AC
  Administered 2021-02-10: 2128 mg via INTRAVENOUS
  Filled 2021-02-10: qty 55.97

## 2021-02-10 MED ORDER — SODIUM CHLORIDE 0.9 % IV SOLN
Freq: Once | INTRAVENOUS | Status: DC
  Filled 2021-02-10: qty 250

## 2021-02-10 MED ORDER — SODIUM CHLORIDE 0.9 % IV SOLN
150.0000 mg | Freq: Once | INTRAVENOUS | Status: AC
  Administered 2021-02-10: 150 mg via INTRAVENOUS
  Filled 2021-02-10: qty 150

## 2021-02-10 MED ORDER — MAGNESIUM SULFATE 2 GM/50ML IV SOLN
INTRAVENOUS | Status: AC
  Filled 2021-02-10: qty 50

## 2021-02-10 MED ORDER — MAGNESIUM SULFATE 2 GM/50ML IV SOLN
2.0000 g | Freq: Once | INTRAVENOUS | Status: AC
Start: 2021-02-10 — End: 2021-02-10
  Administered 2021-02-10: 2 g via INTRAVENOUS

## 2021-02-10 MED ORDER — FAMOTIDINE 20 MG IN NS 100 ML IVPB
INTRAVENOUS | Status: AC
  Filled 2021-02-10: qty 100

## 2021-02-10 MED ORDER — PALONOSETRON HCL INJECTION 0.25 MG/5ML
INTRAVENOUS | Status: AC
  Filled 2021-02-10: qty 5

## 2021-02-10 NOTE — Progress Notes (Signed)
IR called explaining that they have been trying to get in touch with patient.  I called infusion suite and spoke with his infusion nurse.  She states she had already looked at his site and he has great urine output.  She stated that he did not have any dressing on the site and his tubing was pulling downward but didn't appear to be pulled out as the sutures that were placed were still present and close to the skin.  She stated that she placed a dressing near the site to help alleviate some of the pulling of the tubing and the patient stated that his pain was resolved.  She will give him additional supplies today prior to leaving and educate him on how to dress it simply at home to help with the pain from the tubing weight.     Patient states that his initial concerns are gone since she did the assessment and helped with the dressing and the pain has resolved and that he doesn't feel he needs to go to IR for further evaluation.    Called IR to advise.

## 2021-02-10 NOTE — Progress Notes (Signed)
Ok to treat without magnesium level check today per Dr Alen Blew.

## 2021-02-10 NOTE — Progress Notes (Signed)
Dr Alen Blew requested IR be contacted about compliant received by patient.  He states that the nephrostomy tube appears to be slipping out of place and is painful.  Upon calling IR was told that they currently don't have any openings but would have the PA call patient directly to further investigate and determine further actions.

## 2021-02-10 NOTE — Progress Notes (Signed)
Hematology and Oncology Follow Up Visit  Lance [facility] [id number] Sep 01, 1946 75 y.o. 02/10/2021 7:49 AM [facility], MDGates, Lance [place], MD   Principle Diagnosis: 75 year old with bladder cancer diagnosed in April 2022.  He was found to have T2N0 high-grade urothelial carcinoma without any evidence of metastatic disease.   Prior Therapy:  He is status post TURBT on December 21, 2020 with the pathology showed high-grade urothelial carcinoma with muscle invasion.  Current therapy: Neoadjuvant chemotherapy utilizing gemcitabine and cisplatin started on Jan 20, 2021.  He is here for day 1 of cycle 2 of therapy.  The plan is to complete 4 cycles.  Interim History: [facility] returns today for a follow-up visit.  Since the last visit, he completed the first cycle of chemotherapy without any major complaints.  He denies any nausea, vomiting or abdominal pain.  He denies any recent hospitalization or illnesses.  He has not continues to have a nephrostomy tube and a Foley catheter in place.  He reports the nephrostomy tube is painful and slightly pulled out of position.  He denies any fevers, chills or sweats.  He denies any excessive fatigue or tiredness.     Medications: I have reviewed the patient's current medications.  Current Outpatient Medications  Medication Sig Dispense Refill   acetaminophen (TYLENOL) 500 MG tablet Take 1,000 mg by mouth every 6 (six) hours as needed. (Patient not taking: Reported on 01/27/2021)     co-enzyme Q-10 30 MG capsule Take 30 mg by mouth daily.     FENUGREEK PO Take by mouth. 1-2 tabs per day     Ginseng 100 MG CAPS Take by mouth. 1-2 capsules daily     glucosamine-chondroitin 500-400 MG tablet Take 1 tablet by mouth daily. 3 tabs daily Glucosamine with msm     levothyroxine (SYNTHROID) 112 MCG tablet Take 112 mcg by mouth daily before breakfast.     Multiple Vitamin (MULTIVITAMIN) tablet Take 1 tablet by mouth daily.     NON FORMULARY Achanesia golden seal 2  tabs daily     Omega-3 Fatty Acids (FISH OIL PO) Take 1 capsule by mouth daily. Salmon oil 2 -3 tabs daily, 1 in an 2 in pm 1000 mg each     omeprazole (PRILOSEC) 20 MG capsule Take 20 mg by mouth 2 (two) times daily before a meal.     OVER THE COUNTER MEDICATION Apply 1 application topically daily as needed (icy hot balm to neck prn and right shoulder prn).     OVER THE COUNTER MEDICATION Calcium magnesium zinc 3 tabs daily     pravastatin (PRAVACHOL) 20 MG tablet Take 20 mg by mouth at bedtime.      prochlorperazine (COMPAZINE) 10 MG tablet Take 1 tablet (10 mg total) by mouth every 6 (six) hours as needed for nausea or vomiting. 30 tablet 0   UNABLE TO FIND Probiotic daily     UNABLE TO FIND Advanced super beta prostate  1 tab bid     VALERIAN ROOT PO Take by mouth at bedtime as needed.     vitamin C (ASCORBIC ACID) 500 MG tablet Take 500 mg by mouth daily. 2 tabs daily     VITAMIN E PO Take 1 tablet by mouth daily. 1000 mg daily     No current facility-administered medications for this visit.     Allergies:  Allergies  Allergen Reactions   Aleve [Naproxen]      Cannot take Naproxen sodium due to past stomach damage  Physical Exam: Blood pressure 139/83, pulse (!) 114, temperature (!) 97 F (36.1 C), temperature source Tympanic, resp. rate 20, height 6' (1.829 m), weight 197 lb 9.6 oz (89.6 kg), SpO2 97 %.  ECOG: 1 General appearance: alert and cooperative appeared without distress. Head: Normocephalic, without obvious abnormality Oropharynx: No oral thrush or ulcers. Eyes: No scleral icterus.  Pupils are equal and round reactive to light. Lymph nodes: Cervical, supraclavicular, and axillary nodes normal. Heart:regular rate and rhythm, S1, S2 normal, no murmur, click, rub or gallop Lung:chest clear, no wheezing, rales, normal symmetric air entry Abdomin: soft, non-tender, without masses or organomegaly. Neurological: No motor, sensory deficits.  Intact deep tendon  reflexes. Skin: No rashes or lesions.  No ecchymosis or petechiae. Musculoskeletal: No joint deformity or effusion. Psychiatric: Mood and affect are appropriate.    Lab Results: Lab Results  Component Value Date   WBC 5.1 01/27/2021   HGB 9.5 (L) 01/27/2021   HCT 28.2 (L) 01/27/2021   MCV 91.6 01/27/2021   PLT 137 (L) 01/27/2021     Chemistry      Component Value Date/Time   NA 127 (L) 01/27/2021 1043   K 3.4 (L) 01/27/2021 1043   CL 90 (L) 01/27/2021 1043   CO2 29 01/27/2021 1043   BUN 15 01/27/2021 1043   CREATININE 0.99 01/27/2021 1043      Component Value Date/Time   CALCIUM 9.5 01/27/2021 1043   ALKPHOS 70 01/27/2021 1043   AST 18 01/27/2021 1043   ALT 35 01/27/2021 1043   BILITOT 0.5 01/27/2021 1043          Impression and Plan:   75 year old man with:   1.  T2N0 urothelial carcinoma of the bladder diagnosed and April 2022.     He is currently receiving neoadjuvant chemotherapy with cisplatin and gemcitabine without any major complications.  Risks and benefits of proceeding with cycle 2 were reviewed.  Complications that include nausea, vomiting, suppression and worsening neuropathy were discussed.  Upon completing 4 cycles of therapy he will proceed with a radical cystectomy for curative purposes.  He is agreeable to proceed at this time.  Laboratory data reviewed today and hematological parameters are adequate.     2.  IV access: Peripheral veins are currently in use.  Port-A-Cath option has been deferred.   3.  Antiemetics: No nausea or vomiting reported at this time.  Compazine is available to him.   4.  Renal function surveillance: His creatinine clearance remains normal after first cycle of platinum based therapy.  Nephrostomy tube remains in place.   5.  Goals of care: His disease remains incurable and aggressive measures are warranted.   6.  Follow-up: He will return in 1 week to complete cycle 2 and 3 weeks for start of cycle 3.     30   minutes were spent on this encounter.  Time was dedicated to reviewing laboratory data, disease status update, addressing complications related to his cancer and cancer therapy.        Lance Button, MD 6/9/20227:49 AM

## 2021-02-10 NOTE — Patient Instructions (Signed)
[facility]  Discharge Instructions: Thank you for choosing [facility] to provide your oncology and hematology care.   If you have a lab appointment with the [facility], please go directly to the [facility] and check in at the registration area.   Wear comfortable clothing and clothing appropriate for easy access to any Portacath or PICC line.   We strive to give you quality time with your provider. You may need to reschedule your appointment if you arrive late (15 or more minutes).  Arriving late affects you and other patients whose appointments are after yours.  Also, if you miss three or more appointments without notifying the office, you may be dismissed from the clinic at the provider's discretion.      For prescription refill requests, have your pharmacy contact our office and allow 72 hours for refills to be completed.    Today you received the following chemotherapy and/or immunotherapy agents Gemzar and Cisplatin       To help prevent nausea and vomiting after your treatment, we encourage you to take your nausea medication as directed.  BELOW ARE SYMPTOMS THAT SHOULD BE REPORTED IMMEDIATELY: *FEVER GREATER THAN 100.4 F (38 C) OR HIGHER *CHILLS OR SWEATING *NAUSEA AND VOMITING THAT IS NOT CONTROLLED WITH YOUR NAUSEA MEDICATION *UNUSUAL SHORTNESS OF BREATH *UNUSUAL BRUISING OR BLEEDING *URINARY PROBLEMS (pain or burning when urinating, or frequent urination) *BOWEL PROBLEMS (unusual diarrhea, constipation, pain near the anus) TENDERNESS IN MOUTH AND THROAT WITH OR WITHOUT PRESENCE OF ULCERS (sore throat, sores in mouth, or a toothache) UNUSUAL RASH, SWELLING OR PAIN  UNUSUAL VAGINAL DISCHARGE OR ITCHING   Items with * indicate a potential emergency and should be followed up as soon as possible or go to the Emergency Department if any problems should occur.  Please show the CHEMOTHERAPY ALERT CARD or IMMUNOTHERAPY ALERT CARD at  check-in to the Emergency Department and triage nurse.  Should you have questions after your visit or need to cancel or reschedule your appointment, please contact [facility]  Dept: [phone]  and follow the prompts.  Office hours are 8:00 a.m. to 4:30 p.m. Monday - Friday. Please note that voicemails left after 4:00 p.m. may not be returned until the following business day.  We are closed weekends and major holidays. You have access to a nurse at all times for urgent questions. Please call the main number to the clinic Dept: [phone] and follow the prompts.   For any non-urgent questions, you may also contact your provider using MyChart. We now offer e-Visits for anyone 18 and older to request care online for non-urgent symptoms. For details visit [email or web address].   Also download the MyChart app! Go to the app store, search "MyChart", open the app, select [facility], and log in with your MyChart username and password.  Due to Covid, a mask is required upon entering the hospital/clinic. If you do not have a mask, one will be given to you upon arrival. For doctor visits, patients may have 1 support person aged 18 or older with them. For treatment visits, patients cannot have anyone with them due to current Covid guidelines and our immunocompromised population.   

## 2021-02-10 NOTE — Progress Notes (Signed)
Per Dr. Alen Blew, ok to proceed without mag level.

## 2021-02-16 DIAGNOSIS — M545 Low back pain, unspecified: Secondary | ICD-10-CM | POA: Diagnosis not present

## 2021-02-16 DIAGNOSIS — N3289 Other specified disorders of bladder: Secondary | ICD-10-CM | POA: Diagnosis not present

## 2021-02-16 DIAGNOSIS — E871 Hypo-osmolality and hyponatremia: Secondary | ICD-10-CM | POA: Diagnosis not present

## 2021-02-16 DIAGNOSIS — R252 Cramp and spasm: Secondary | ICD-10-CM | POA: Diagnosis not present

## 2021-02-16 DIAGNOSIS — K5900 Constipation, unspecified: Secondary | ICD-10-CM | POA: Diagnosis not present

## 2021-02-17 ENCOUNTER — Inpatient Hospital Stay: Payer: Medicare Other

## 2021-02-17 ENCOUNTER — Other Ambulatory Visit: Payer: Self-pay

## 2021-02-17 VITALS — BP 133/82 | HR 91 | Temp 98.9°F | Resp 18

## 2021-02-17 DIAGNOSIS — Z79899 Other long term (current) drug therapy: Secondary | ICD-10-CM | POA: Diagnosis not present

## 2021-02-17 DIAGNOSIS — C679 Malignant neoplasm of bladder, unspecified: Secondary | ICD-10-CM | POA: Diagnosis not present

## 2021-02-17 DIAGNOSIS — C678 Malignant neoplasm of overlapping sites of bladder: Secondary | ICD-10-CM

## 2021-02-17 DIAGNOSIS — Z5111 Encounter for antineoplastic chemotherapy: Secondary | ICD-10-CM | POA: Diagnosis not present

## 2021-02-17 DIAGNOSIS — E039 Hypothyroidism, unspecified: Secondary | ICD-10-CM | POA: Diagnosis not present

## 2021-02-17 DIAGNOSIS — M1990 Unspecified osteoarthritis, unspecified site: Secondary | ICD-10-CM | POA: Diagnosis not present

## 2021-02-17 DIAGNOSIS — E785 Hyperlipidemia, unspecified: Secondary | ICD-10-CM | POA: Diagnosis not present

## 2021-02-17 DIAGNOSIS — K219 Gastro-esophageal reflux disease without esophagitis: Secondary | ICD-10-CM | POA: Diagnosis not present

## 2021-02-17 LAB — CBC WITH DIFFERENTIAL (CANCER CENTER ONLY)
Abs Immature Granulocytes: 0.01 10*3/uL (ref 0.00–0.07)
Basophils Absolute: 0 10*3/uL (ref 0.0–0.1)
Basophils Relative: 0 %
Eosinophils Absolute: 0 10*3/uL (ref 0.0–0.5)
Eosinophils Relative: 0 %
HCT: 26.4 % — ABNORMAL LOW (ref 39.0–52.0)
Hemoglobin: 9 g/dL — ABNORMAL LOW (ref 13.0–17.0)
Immature Granulocytes: 0 %
Lymphocytes Relative: 26 %
Lymphs Abs: 1.2 10*3/uL (ref 0.7–4.0)
MCH: 30.2 pg (ref 26.0–34.0)
MCHC: 34.1 g/dL (ref 30.0–36.0)
MCV: 88.6 fL (ref 80.0–100.0)
Monocytes Absolute: 0.9 10*3/uL (ref 0.1–1.0)
Monocytes Relative: 19 %
Neutro Abs: 2.5 10*3/uL (ref 1.7–7.7)
Neutrophils Relative %: 55 %
Platelet Count: 281 10*3/uL (ref 150–400)
RBC: 2.98 MIL/uL — ABNORMAL LOW (ref 4.22–5.81)
RDW: 13.4 % (ref 11.5–15.5)
WBC Count: 4.5 10*3/uL (ref 4.0–10.5)
nRBC: 0 % (ref 0.0–0.2)

## 2021-02-17 LAB — CMP (CANCER CENTER ONLY)
ALT: 18 U/L (ref 0–44)
AST: 24 U/L (ref 15–41)
Albumin: 3.1 g/dL — ABNORMAL LOW (ref 3.5–5.0)
Alkaline Phosphatase: 82 U/L (ref 38–126)
Anion gap: 9 (ref 5–15)
BUN: 13 mg/dL (ref 8–23)
CO2: 25 mmol/L (ref 22–32)
Calcium: 9.2 mg/dL (ref 8.9–10.3)
Chloride: 92 mmol/L — ABNORMAL LOW (ref 98–111)
Creatinine: 0.85 mg/dL (ref 0.61–1.24)
GFR, Estimated: >60 mL/min (ref >60)
Glucose, Bld: 108 mg/dL — ABNORMAL HIGH (ref 70–99)
Potassium: 4 mmol/L (ref 3.5–5.1)
Sodium: 126 mmol/L — ABNORMAL LOW (ref 135–145)
Total Bilirubin: 0.3 mg/dL (ref 0.3–1.2)
Total Protein: 7.1 g/dL (ref 6.5–8.1)

## 2021-02-17 MED ORDER — PROCHLORPERAZINE MALEATE 10 MG PO TABS
10.0000 mg | ORAL_TABLET | Freq: Once | ORAL | Status: AC
Start: 2021-02-17 — End: 2021-02-17
  Administered 2021-02-17: 10 mg via ORAL

## 2021-02-17 MED ORDER — SODIUM CHLORIDE 0.9 % IV SOLN
Freq: Once | INTRAVENOUS | Status: AC
  Filled 2021-02-17: qty 250

## 2021-02-17 MED ORDER — SODIUM CHLORIDE 0.9 % IV SOLN
1000.0000 mg/m2 | Freq: Once | INTRAVENOUS | Status: AC
  Administered 2021-02-17: 2128 mg via INTRAVENOUS
  Filled 2021-02-17: qty 55.97

## 2021-02-17 MED ORDER — PROCHLORPERAZINE MALEATE 10 MG PO TABS
ORAL_TABLET | ORAL | Status: AC
  Filled 2021-02-17: qty 1

## 2021-02-17 MED ORDER — SODIUM CHLORIDE 0.9 % IV SOLN
Freq: Once | INTRAVENOUS | Status: AC
Start: 2021-02-17 — End: 2021-02-17
  Filled 2021-02-17: qty 250

## 2021-02-17 NOTE — Patient Instructions (Signed)
[facility]  Discharge Instructions: Thank you for choosing [facility] to provide your oncology and hematology care.   If you have a lab appointment with the Cancer Center, please go directly to the Cancer Center and check in at the registration area.   Wear comfortable clothing and clothing appropriate for easy access to any Portacath or PICC line.   We strive to give you quality time with your provider. You may need to reschedule your appointment if you arrive late (15 or more minutes).  Arriving late affects you and other patients whose appointments are after yours.  Also, if you miss three or more appointments without notifying the office, you may be dismissed from the clinic at the provider's discretion.      For prescription refill requests, have your pharmacy contact our office and allow 72 hours for refills to be completed.    Today you received the following chemotherapy and/or immunotherapy agents Gemzar      To help prevent nausea and vomiting after your treatment, we encourage you to take your nausea medication as directed.  BELOW ARE SYMPTOMS THAT SHOULD BE REPORTED IMMEDIATELY: *FEVER GREATER THAN 100.4 F (38 C) OR HIGHER *CHILLS OR SWEATING *NAUSEA AND VOMITING THAT IS NOT CONTROLLED WITH YOUR NAUSEA MEDICATION *UNUSUAL SHORTNESS OF BREATH *UNUSUAL BRUISING OR BLEEDING *URINARY PROBLEMS (pain or burning when urinating, or frequent urination) *BOWEL PROBLEMS (unusual diarrhea, constipation, pain near the anus) TENDERNESS IN MOUTH AND THROAT WITH OR WITHOUT PRESENCE OF ULCERS (sore throat, sores in mouth, or a toothache) UNUSUAL RASH, SWELLING OR PAIN  UNUSUAL VAGINAL DISCHARGE OR ITCHING   Items with * indicate a potential emergency and should be followed up as soon as possible or go to the Emergency Department if any problems should occur.  Please show the CHEMOTHERAPY ALERT CARD or IMMUNOTHERAPY ALERT CARD at check-in to the  Emergency Department and triage nurse.  Should you have questions after your visit or need to cancel or reschedule your appointment, please contact [facility]  Dept: [phone]  and follow the prompts.  Office hours are 8:00 a.m. to 4:30 p.m. Monday - Friday. Please note that voicemails left after 4:00 p.m. may not be returned until the following business day.  We are closed weekends and major holidays. You have access to a nurse at all times for urgent questions. Please call the main number to the clinic Dept: [phone] and follow the prompts.   For any non-urgent questions, you may also contact your provider using MyChart. We now offer e-Visits for anyone 18 and older to request care online for non-urgent symptoms. For details visit [email or web address].   Also download the MyChart app! Go to the app store, search "MyChart", open the app, select , and log in with your MyChart username and password.  Due to Covid, a mask is required upon entering the hospital/clinic. If you do not have a mask, one will be given to you upon arrival. For doctor visits, patients may have 1 support person aged 18 or older with them. For treatment visits, patients cannot have anyone with them due to current Covid guidelines and our immunocompromised population.   

## 2021-03-01 ENCOUNTER — Other Ambulatory Visit (HOSPITAL_COMMUNITY): Payer: Self-pay | Admitting: Interventional Radiology

## 2021-03-01 ENCOUNTER — Telehealth (HOSPITAL_COMMUNITY): Payer: Self-pay

## 2021-03-01 DIAGNOSIS — N1330 Unspecified hydronephrosis: Secondary | ICD-10-CM

## 2021-03-01 NOTE — Telephone Encounter (Signed)
-----   Message from Annye Asa, MD sent at 03/01/2021  4:37 PM EDT ----- Regarding: RE: Nephrostomy exchange? I would bring him in for an exchange.  Mir ----- Message ----- From: Danielle Dess Sent: 03/01/2021   4:23 PM EDT To: Paula Libra Mir, MD Subject: Nephrostomy exchange?                          Dr. Dwaine Gale,   Mr. Harkins had his tube placed on 12/28/20. He says that the bandages around his tubing have now come off and the tube seems to be slipping and is painful. Does he need to come in for an exchange. It looks like he called about this back on 6/9 (not sure who he spoke with) but it doesn't look like it was taken care of. Please advise.  Thanks,  Lia Foyer

## 2021-03-03 ENCOUNTER — Inpatient Hospital Stay: Payer: Medicare Other

## 2021-03-03 ENCOUNTER — Inpatient Hospital Stay (HOSPITAL_BASED_OUTPATIENT_CLINIC_OR_DEPARTMENT_OTHER): Payer: Medicare Other | Admitting: Oncology

## 2021-03-03 ENCOUNTER — Other Ambulatory Visit: Payer: Self-pay

## 2021-03-03 VITALS — BP 126/73 | HR 120 | Temp 98.7°F | Resp 19 | Ht 72.0 in | Wt 196.4 lb

## 2021-03-03 DIAGNOSIS — Z5111 Encounter for antineoplastic chemotherapy: Secondary | ICD-10-CM | POA: Diagnosis not present

## 2021-03-03 DIAGNOSIS — C679 Malignant neoplasm of bladder, unspecified: Secondary | ICD-10-CM | POA: Diagnosis not present

## 2021-03-03 DIAGNOSIS — C678 Malignant neoplasm of overlapping sites of bladder: Secondary | ICD-10-CM | POA: Diagnosis not present

## 2021-03-03 DIAGNOSIS — Z79899 Other long term (current) drug therapy: Secondary | ICD-10-CM | POA: Diagnosis not present

## 2021-03-03 LAB — CBC WITH DIFFERENTIAL (CANCER CENTER ONLY)
Abs Immature Granulocytes: 0.03 10*3/uL (ref 0.00–0.07)
Basophils Absolute: 0 10*3/uL (ref 0.0–0.1)
Basophils Relative: 0 %
Eosinophils Absolute: 0.1 10*3/uL (ref 0.0–0.5)
Eosinophils Relative: 1 %
HCT: 27.4 % — ABNORMAL LOW (ref 39.0–52.0)
Hemoglobin: 9.4 g/dL — ABNORMAL LOW (ref 13.0–17.0)
Immature Granulocytes: 0 %
Lymphocytes Relative: 14 %
Lymphs Abs: 1.1 10*3/uL (ref 0.7–4.0)
MCH: 31.3 pg (ref 26.0–34.0)
MCHC: 34.3 g/dL (ref 30.0–36.0)
MCV: 91.3 fL (ref 80.0–100.0)
Monocytes Absolute: 1.4 10*3/uL — ABNORMAL HIGH (ref 0.1–1.0)
Monocytes Relative: 18 %
Neutro Abs: 5.1 10*3/uL (ref 1.7–7.7)
Neutrophils Relative %: 67 %
Platelet Count: 444 10*3/uL — ABNORMAL HIGH (ref 150–400)
RBC: 3 MIL/uL — ABNORMAL LOW (ref 4.22–5.81)
RDW: 16.5 % — ABNORMAL HIGH (ref 11.5–15.5)
WBC Count: 7.7 10*3/uL (ref 4.0–10.5)
nRBC: 0 % (ref 0.0–0.2)

## 2021-03-03 LAB — CMP (CANCER CENTER ONLY)
ALT: 12 U/L (ref 0–44)
AST: 19 U/L (ref 15–41)
Albumin: 3.2 g/dL — ABNORMAL LOW (ref 3.5–5.0)
Alkaline Phosphatase: 81 U/L (ref 38–126)
Anion gap: 11 (ref 5–15)
BUN: 13 mg/dL (ref 8–23)
CO2: 23 mmol/L (ref 22–32)
Calcium: 9.2 mg/dL (ref 8.9–10.3)
Chloride: 97 mmol/L — ABNORMAL LOW (ref 98–111)
Creatinine: 1.06 mg/dL (ref 0.61–1.24)
GFR, Estimated: >60 mL/min (ref >60)
Glucose, Bld: 142 mg/dL — ABNORMAL HIGH (ref 70–99)
Potassium: 4.3 mmol/L (ref 3.5–5.1)
Sodium: 131 mmol/L — ABNORMAL LOW (ref 135–145)
Total Bilirubin: 0.3 mg/dL (ref 0.3–1.2)
Total Protein: 6.8 g/dL (ref 6.5–8.1)

## 2021-03-03 MED ORDER — SODIUM CHLORIDE 0.9 % IV SOLN
Freq: Once | INTRAVENOUS | Status: AC
  Filled 2021-03-03: qty 250

## 2021-03-03 MED ORDER — SODIUM CHLORIDE 0.9 % IV SOLN
10.0000 mg | Freq: Once | INTRAVENOUS | Status: AC
  Administered 2021-03-03: 10 mg via INTRAVENOUS
  Filled 2021-03-03: qty 10

## 2021-03-03 MED ORDER — MAGNESIUM SULFATE 2 GM/50ML IV SOLN
2.0000 g | Freq: Once | INTRAVENOUS | Status: AC
Start: 2021-03-03 — End: 2021-03-03
  Administered 2021-03-03: 2 g via INTRAVENOUS

## 2021-03-03 MED ORDER — SODIUM CHLORIDE 0.9 % IV SOLN
70.0000 mg/m2 | Freq: Once | INTRAVENOUS | Status: AC
  Administered 2021-03-03: 150 mg via INTRAVENOUS
  Filled 2021-03-03: qty 150

## 2021-03-03 MED ORDER — MAGNESIUM SULFATE 2 GM/50ML IV SOLN
INTRAVENOUS | Status: AC
  Filled 2021-03-03: qty 50

## 2021-03-03 MED ORDER — SODIUM CHLORIDE 0.9 % IV SOLN
1000.0000 mg/m2 | Freq: Once | INTRAVENOUS | Status: AC
  Administered 2021-03-03: 2128 mg via INTRAVENOUS
  Filled 2021-03-03: qty 55.97

## 2021-03-03 MED ORDER — POTASSIUM CHLORIDE IN NACL 20-0.9 MEQ/L-% IV SOLN
Freq: Once | INTRAVENOUS | Status: AC
Start: 2021-03-03 — End: 2021-03-03
  Filled 2021-03-03: qty 1000

## 2021-03-03 MED ORDER — SODIUM CHLORIDE 0.9 % IV SOLN
150.0000 mg | Freq: Once | INTRAVENOUS | Status: AC
  Administered 2021-03-03: 150 mg via INTRAVENOUS
  Filled 2021-03-03: qty 150

## 2021-03-03 MED ORDER — HEPARIN SOD (PORK) LOCK FLUSH 100 UNIT/ML IV SOLN
500.0000 [IU] | Freq: Once | INTRAVENOUS | Status: DC | PRN
  Filled 2021-03-03: qty 5

## 2021-03-03 MED ORDER — PALONOSETRON HCL INJECTION 0.25 MG/5ML
0.2500 mg | Freq: Once | INTRAVENOUS | Status: AC
  Administered 2021-03-03: 0.25 mg via INTRAVENOUS

## 2021-03-03 MED ORDER — PALONOSETRON HCL INJECTION 0.25 MG/5ML
INTRAVENOUS | Status: AC
  Filled 2021-03-03: qty 5

## 2021-03-03 NOTE — Progress Notes (Signed)
Per Dr. Alen Blew, okay to administer post hydration fluids with Cisplatin.

## 2021-03-03 NOTE — Patient Instructions (Signed)
[facility]  Discharge Instructions: Thank you for choosing [facility] to provide your oncology and hematology care.   If you have a lab appointment with the [facility], please go directly to the [facility] and check in at the registration area.   Wear comfortable clothing and clothing appropriate for easy access to any Portacath or PICC line.   We strive to give you quality time with your provider. You may need to reschedule your appointment if you arrive late (15 or more minutes).  Arriving late affects you and other patients whose appointments are after yours.  Also, if you miss three or more appointments without notifying the office, you may be dismissed from the clinic at the provider's discretion.      For prescription refill requests, have your pharmacy contact our office and allow 72 hours for refills to be completed.    Today you received the following chemotherapy and/or immunotherapy agents: Cisplatin/Gemzar.      To help prevent nausea and vomiting after your treatment, we encourage you to take your nausea medication as directed.  BELOW ARE SYMPTOMS THAT SHOULD BE REPORTED IMMEDIATELY: *FEVER GREATER THAN 100.4 F (38 C) OR HIGHER *CHILLS OR SWEATING *NAUSEA AND VOMITING THAT IS NOT CONTROLLED WITH YOUR NAUSEA MEDICATION *UNUSUAL SHORTNESS OF BREATH *UNUSUAL BRUISING OR BLEEDING *URINARY PROBLEMS (pain or burning when urinating, or frequent urination) *BOWEL PROBLEMS (unusual diarrhea, constipation, pain near the anus) TENDERNESS IN MOUTH AND THROAT WITH OR WITHOUT PRESENCE OF ULCERS (sore throat, sores in mouth, or a toothache) UNUSUAL RASH, SWELLING OR PAIN  UNUSUAL VAGINAL DISCHARGE OR ITCHING   Items with * indicate a potential emergency and should be followed up as soon as possible or go to the Emergency Department if any problems should occur.  Please show the CHEMOTHERAPY ALERT CARD or IMMUNOTHERAPY ALERT CARD at  check-in to the Emergency Department and triage nurse.  Should you have questions after your visit or need to cancel or reschedule your appointment, please contact [facility]  Dept: [phone]  and follow the prompts.  Office hours are 8:00 a.m. to 4:30 p.m. Monday - Friday. Please note that voicemails left after 4:00 p.m. may not be returned until the following business day.  We are closed weekends and major holidays. You have access to a nurse at all times for urgent questions. Please call the main number to the clinic Dept: [phone] and follow the prompts.   For any non-urgent questions, you may also contact your provider using MyChart. We now offer e-Visits for anyone 84 and older to request care online for non-urgent symptoms. For details visit [email or web address].   Also download the MyChart app! Go to the app store, search "MyChart", open the app, select , and log in with your MyChart username and password.  Due to Covid, a mask is required upon entering the hospital/clinic. If you do not have a mask, one will be given to you upon arrival. For doctor visits, patients may have 1 support person aged 61 or older with them. For treatment visits, patients cannot have anyone with them due to current Covid guidelines and our immunocompromised population.

## 2021-03-03 NOTE — Progress Notes (Signed)
Hematology and Oncology Follow Up Visit  [facility] [id number] 1946/01/11 75 y.o. 03/03/2021 8:10 AM [facility], MDGates, Lance [place], MD   Principle Diagnosis: 75 year old with T2N0 bladder cancer diagnosed in April 2022.  He was found to have high-grade urothelial carcinoma at the time of diagnosis.   Prior Therapy:  He is status post TURBT on December 21, 2020 with the pathology showed high-grade urothelial carcinoma with muscle invasion.  Current therapy: Neoadjuvant chemotherapy utilizing gemcitabine and cisplatin started on Jan 20, 2021.  He is here for day 1 of cycle 3 of therapy.    Interim History: [facility] is here for repeat evaluation.  Since last visit, he reports no major changes in his health.  He has tolerated chemotherapy without any major complaints.  He denies any nausea vomiting or abdominal pain.  He denies any worsening neuropathy or excessive fatigue.  He still has some issues with nephrostomy tube including occasional leaking and malpositioning.  He will undergo interventional radiology evaluation tomorrow.  Otherwise he continues to be active and attends to activities of daily living.     Medications: Updated on review. Current Outpatient Medications  Medication Sig Dispense Refill   acetaminophen (TYLENOL) 500 MG tablet Take 1,000 mg by mouth every 6 (six) hours as needed. (Patient not taking: Reported on 01/27/2021)     co-enzyme Q-10 30 MG capsule Take 30 mg by mouth daily.     FENUGREEK PO Take by mouth. 1-2 tabs per day     Ginseng 100 MG CAPS Take by mouth. 1-2 capsules daily     glucosamine-chondroitin 500-400 MG tablet Take 1 tablet by mouth daily. 3 tabs daily Glucosamine with msm     levothyroxine (SYNTHROID) 112 MCG tablet Take 112 mcg by mouth daily before breakfast.     Multiple Vitamin (MULTIVITAMIN) tablet Take 1 tablet by mouth daily.     NON FORMULARY Achanesia golden seal 2 tabs daily     Omega-3 Fatty Acids (FISH OIL PO) Take 1 capsule by  mouth daily. Salmon oil 2 -3 tabs daily, 1 in an 2 in pm 1000 mg each     omeprazole (PRILOSEC) 20 MG capsule Take 20 mg by mouth 2 (two) times daily before a meal.     OVER THE COUNTER MEDICATION Apply 1 application topically daily as needed (icy hot balm to neck prn and right shoulder prn).     OVER THE COUNTER MEDICATION Calcium magnesium zinc 3 tabs daily     pravastatin (PRAVACHOL) 20 MG tablet Take 20 mg by mouth at bedtime.      prochlorperazine (COMPAZINE) 10 MG tablet Take 1 tablet (10 mg total) by mouth every 6 (six) hours as needed for nausea or vomiting. 30 tablet 0   UNABLE TO FIND Probiotic daily     UNABLE TO FIND Advanced super beta prostate  1 tab bid     VALERIAN ROOT PO Take by mouth at bedtime as needed.     vitamin C (ASCORBIC ACID) 500 MG tablet Take 500 mg by mouth daily. 2 tabs daily     VITAMIN E PO Take 1 tablet by mouth daily. 1000 mg daily     No current facility-administered medications for this visit.     Allergies:  Allergies  Allergen Reactions   Aleve [Naproxen]      Cannot take Naproxen sodium due to past stomach damage       Physical Exam: Blood pressure 126/73, pulse (!) 120, temperature 98.7 F (37.1 C),  temperature source Oral, resp. rate 19, height 6' (1.829 m), weight 196 lb 6.4 oz (89.1 kg), SpO2 99 %.   ECOG: 1    General appearance: Comfortable appearing without any discomfort Head: Normocephalic without any trauma Oropharynx: Mucous membranes are moist and pink without any thrush or ulcers. Eyes: Pupils are equal and round reactive to light. Lymph nodes: No cervical, supraclavicular, inguinal or axillary lymphadenopathy.   Heart:regular rate and rhythm.  S1 and S2 without leg edema. Lung: Clear without any rhonchi or wheezes.  No dullness to percussion. Abdomin: Soft, nontender, nondistended with good bowel sounds.  No hepatosplenomegaly. Musculoskeletal: No joint deformity or effusion.  Full range of motion noted. Neurological:  No deficits noted on motor, sensory and deep tendon reflex exam. Skin: No petechial rash or dryness.  Appeared moist.     Lab Results: Lab Results  Component Value Date   WBC 7.7 03/03/2021   HGB 9.4 (L) 03/03/2021   HCT 27.4 (L) 03/03/2021   MCV 91.3 03/03/2021   PLT 444 (H) 03/03/2021     Chemistry      Component Value Date/Time   NA 126 (L) 02/17/2021 1048   K 4.0 02/17/2021 1048   CL 92 (L) 02/17/2021 1048   CO2 25 02/17/2021 1048   BUN 13 02/17/2021 1048   CREATININE 0.85 02/17/2021 1048      Component Value Date/Time   CALCIUM 9.2 02/17/2021 1048   ALKPHOS 82 02/17/2021 1048   AST 24 02/17/2021 1048   ALT 18 02/17/2021 1048   BILITOT 0.3 02/17/2021 1048          Impression and Plan:   75 year old man with:   1.  Bladder cancer diagnosed in April 2022.  He was found to have T2N0 high-grade urothelial carcinoma.     His disease status was updated at this time and treatment choices were reviewed.  He has tolerated neoadjuvant chemotherapy without any major complaints.  Risks and benefits of proceeding with treatment to complete 4 cycles were discussed.  Laboratory data from today reviewed and showed adequate hematological parameters.  Radical cystectomy will be considered after completing 4 cycles.  Alternative options were discussed today which include radiation therapy which is not conventional after neoadjuvant chemotherapy but could be a possibility.     2.  IV access: Port-A-Cath option was deferred and he prefers peripheral veins.  Currently in use without any issues.   3.  Antiemetics: Compazine is available to him without any nausea or vomiting.   4.  Renal function surveillance: Creatinine clearance remains normal with nephrostomy tube in place.   5.  Goals of care: His therapy is curative and aggressive measures are warranted.   6.  Follow-up: In 1 week to complete cycle 3 and in 3 weeks for the start of cycle 4.     30  minutes were dedicated  to this visit.  The time was spent on reviewing laboratory data, disease status update, complications related to therapy and future plan of care discussion.        Zola Button, MD 6/30/20228:10 AM

## 2021-03-04 ENCOUNTER — Ambulatory Visit (HOSPITAL_COMMUNITY)
Admission: RE | Admit: 2021-03-04 | Discharge: 2021-03-04 | Disposition: A | Discharge status: Home / Self Care | Payer: Medicare Other | Source: Ambulatory Visit | Attending: Interventional Radiology | Admitting: Interventional Radiology

## 2021-03-04 DIAGNOSIS — N1330 Unspecified hydronephrosis: Secondary | ICD-10-CM | POA: Insufficient documentation

## 2021-03-04 DIAGNOSIS — Z436 Encounter for attention to other artificial openings of urinary tract: Secondary | ICD-10-CM | POA: Insufficient documentation

## 2021-03-04 DIAGNOSIS — C679 Malignant neoplasm of bladder, unspecified: Secondary | ICD-10-CM | POA: Diagnosis not present

## 2021-03-04 HISTORY — PX: IR NEPHROSTOMY EXCHANGE RIGHT: IMG6070

## 2021-03-04 MED ORDER — LIDOCAINE HCL 1 % IJ SOLN
INTRAMUSCULAR | Status: AC
  Filled 2021-03-04: qty 20

## 2021-03-04 MED ORDER — LIDOCAINE HCL 1 % IJ SOLN
INTRAMUSCULAR | Status: DC | PRN
  Administered 2021-03-04: 5 mL

## 2021-03-04 MED ORDER — IOHEXOL 300 MG/ML  SOLN
50.0000 mL | Freq: Once | INTRAMUSCULAR | Status: AC | PRN
  Administered 2021-03-04: 10 mL

## 2021-03-04 NOTE — Procedures (Signed)
Interventional Radiology Procedure:   Indications: Bladder cancer and needs right nephrostomy tube exchange  Procedure: Nephrostomy tube exchange  Findings: New 10 Fr tube in renal pelvis  Complications: None     EBL: None  Plan: Plan for routine exchanges.   Adisson Deak R. Anselm Pancoast, MD  Pager: [phone]

## 2021-03-05 ENCOUNTER — Emergency Department (HOSPITAL_COMMUNITY)
Admission: EM | Admit: 2021-03-05 | Discharge: 2021-03-05 | Disposition: A | Discharge status: Home / Self Care | Payer: Medicare Other | Attending: Emergency Medicine | Admitting: Emergency Medicine

## 2021-03-05 ENCOUNTER — Other Ambulatory Visit: Payer: Self-pay

## 2021-03-05 DIAGNOSIS — Z79899 Other long term (current) drug therapy: Secondary | ICD-10-CM | POA: Insufficient documentation

## 2021-03-05 DIAGNOSIS — Y828 Other medical devices associated with adverse incidents: Secondary | ICD-10-CM | POA: Insufficient documentation

## 2021-03-05 DIAGNOSIS — T801XXA Vascular complications following infusion, transfusion and therapeutic injection, initial encounter: Secondary | ICD-10-CM | POA: Insufficient documentation

## 2021-03-05 DIAGNOSIS — I808 Phlebitis and thrombophlebitis of other sites: Secondary | ICD-10-CM | POA: Diagnosis not present

## 2021-03-05 DIAGNOSIS — I809 Phlebitis and thrombophlebitis of unspecified site: Secondary | ICD-10-CM

## 2021-03-05 DIAGNOSIS — E039 Hypothyroidism, unspecified: Secondary | ICD-10-CM | POA: Diagnosis not present

## 2021-03-05 DIAGNOSIS — Z87891 Personal history of nicotine dependence: Secondary | ICD-10-CM | POA: Insufficient documentation

## 2021-03-05 DIAGNOSIS — Z8551 Personal history of malignant neoplasm of bladder: Secondary | ICD-10-CM | POA: Diagnosis not present

## 2021-03-05 DIAGNOSIS — R2232 Localized swelling, mass and lump, left upper limb: Secondary | ICD-10-CM | POA: Diagnosis present

## 2021-03-05 NOTE — ED Triage Notes (Signed)
Patient reports having chemo infusing in left arm on Thursday for bladder cancer, left arm is swollen and red, tender. Patient says he usually gets a little irritation but it is usually resolved by the following day.

## 2021-03-05 NOTE — Discharge Instructions (Addendum)
If you develop new or worsening swelling, new or worsening redness, pain, weakness or numbness in your arm or hand, fever, or any other new/concerning symptoms then return to the ER for evaluation.  Otherwise you may apply warm or cool compresses to the affected area.

## 2021-03-05 NOTE — ED Provider Notes (Signed)
[facility] DEPT Provider Note   CSN: [id number] Arrival date & time: 03/05/21  1158     History Chief Complaint  Patient presents with   Arm Swelling    Lance Friedman is a 75 y.o. male.  HPI 75 year old male presents with left forearm swelling and redness.  2 days ago he received infusions for chemotherapy.  He had an IV in his left forearm.  He states that typically the medicine burns for a little bit but it was much worse and persistent despite them having to slow down and given fluids.  The next day (yesterday) he woke up and his left forearm was swollen and red and uncomfortable/sore.  No weakness or numbness.  Today it is still swollen and red but it is improved.  He has not had a fever.  Continues to not have weakness or numbness. Yesterday he could see the vein and it was red throughout it's course.  Past Medical History:  Diagnosis Date   Arthritis    oa   Bladder cancer ([place])    Chronic neck pain    Gastritis    GERD (gastroesophageal reflux disease)    Gout    last flare up fall 2021   Headache    Hematuria    started again 12-13-2020   Hyperlipemia    Hypothyroidism    Pre-diabetes    Vitamin D deficiency    Wears dentures    full set   Wears glasses     Patient Active Problem List   Diagnosis Date Noted   Goals of care, counseling/discussion 01/11/2021   Malignant neoplasm of overlapping sites of bladder ([place]) 01/11/2021   Bladder cancer ([place]) 12/21/2020    Past Surgical History:  Procedure Laterality Date   COLONOSCOPY WITH PROPOFOL N/A 03/21/2016   Procedure: COLONOSCOPY WITH PROPOFOL;  Surgeon: Garlan Fair, MD;  Location: [facility];  Service: Endoscopy;  Laterality: N/A;   CYSTOSCOPY W/ RETROGRADES Bilateral 12/21/2020   Procedure: CYSTOSCOPY WITH RETROGRADE PYELOGRAM;  Surgeon: [name], MD;  Location: [facility];  Service: Urology;  Laterality: Bilateral;   ingrown toenail surgery  yrs  ago   IR NEPHROSTOMY EXCHANGE RIGHT  03/04/2021   IR NEPHROSTOMY PLACEMENT RIGHT  12/28/2020   MOUTH SURGERY  2015   tooth extraction   TONSILLECTOMY  as child   TRANSURETHRAL RESECTION OF BLADDER TUMOR N/A 12/21/2020   Procedure: TRANSURETHRAL RESECTION OF BLADDER TUMOR (TURBT);  Surgeon: [name], MD;  Location: [facility];  Service: Urology;  Laterality: N/A;  57 MINS       No family history on file.  Social History   Tobacco Use   Smoking status: Former    Years: 5.00    Pack years: 0.00    Types: Cigarettes    Quit date: 09/05/1983    Years since quitting: 37.5   Smokeless tobacco: Never   Tobacco comments:    social then 2 ppd x 2 years  Vaping Use   Vaping Use: Never used  Substance Use Topics   Alcohol use: No    Comment: quit    Drug use: Yes    Types: Marijuana    Comment: last marijuana use 12 yrs ago    Home Medications Prior to Admission medications   Medication Sig Start Date End Date Taking? Authorizing Provider  acetaminophen (TYLENOL) 500 MG tablet Take 1,000 mg by mouth every 6 (six) hours as needed. Patient not taking: Reported  on 01/27/2021    [provider]  co-enzyme Q-10 30 MG capsule Take 30 mg by mouth daily.    [provider]  FENUGREEK PO Take by mouth. 1-2 tabs per day    [provider]  Ginseng 100 MG CAPS Take by mouth. 1-2 capsules daily    [provider]  glucosamine-chondroitin 500-400 MG tablet Take 1 tablet by mouth daily. 3 tabs daily Glucosamine with msm    [provider]  levothyroxine (SYNTHROID) 112 MCG tablet Take 112 mcg by mouth daily before breakfast.    [provider]  Multiple Vitamin (MULTIVITAMIN) tablet Take 1 tablet by mouth daily.    [provider]  NON FORMULARY Achanesia golden seal 2 tabs daily    [provider]  Omega-3 Fatty Acids (FISH OIL PO) Take 1 capsule by mouth daily. Salmon oil 2 -3 tabs daily, 1 in an 2 in  pm 1000 mg each    [provider]  omeprazole (PRILOSEC) 20 MG capsule Take 20 mg by mouth 2 (two) times daily before a meal.    [provider]  OVER THE COUNTER MEDICATION Apply 1 application topically daily as needed (icy hot balm to neck prn and right shoulder prn).    [provider]  OVER THE COUNTER MEDICATION Calcium magnesium zinc 3 tabs daily    [provider]  pravastatin (PRAVACHOL) 20 MG tablet Take 20 mg by mouth at bedtime.     [provider]  prochlorperazine (COMPAZINE) 10 MG tablet Take 1 tablet (10 mg total) by mouth every 6 (six) hours as needed for nausea or vomiting. 01/11/21   Alen Blew, Mathis Dad, MD  UNABLE TO FIND Probiotic daily    [provider]  UNABLE TO FIND Advanced super beta prostate  1 tab bid    [provider]  VALERIAN ROOT PO Take by mouth at bedtime as needed.    [provider]  vitamin C (ASCORBIC ACID) 500 MG tablet Take 500 mg by mouth daily. 2 tabs daily    [provider]  VITAMIN E PO Take 1 tablet by mouth daily. 1000 mg daily    [provider]    Allergies    Aleve [naproxen]  Review of Systems   Review of Systems  Constitutional:  Positive for fatigue. Negative for fever.  Skin:  Positive for color change.  Neurological:  Negative for weakness and numbness.   Physical Exam Updated Vital Signs BP (!) 135/92   Pulse 82   Temp 97.8 F (36.6 C) (Oral)   Resp 18   Ht 6' (1.829 m)   Wt 89.1 kg   SpO2 96%   BMI 26.64 kg/m   Physical Exam Vitals and nursing note reviewed.  Constitutional:      General: He is not in acute distress.    Appearance: He is well-developed. He is not ill-appearing or diaphoretic.  HENT:     Head: Normocephalic and atraumatic.     Right Ear: External ear normal.     Left Ear: External ear normal.     Nose: Nose normal.  Eyes:     General:        Right eye: No discharge.        Left eye: No discharge.   Cardiovascular:     Rate and Rhythm: Normal rate and regular rhythm.     Pulses:          Radial pulses are 2+ on  the left side.  Pulmonary:     Effort: Pulmonary effort is normal.  Abdominal:     General: There is no distension.     Palpations: Abdomen is soft.  Musculoskeletal:     Left elbow: Normal range of motion. No tenderness.     Left wrist: No swelling or tenderness. Normal range of motion.     Cervical back: Neck supple.     Comments: Normal ROM of left hand, wrist, forearm/elbow See picture. No warmth but there is some mild redness to the ulnar left forearm. No fluctuance. Normal strength/sensation in the hand  Skin:    General: Skin is warm and dry.  Neurological:     Mental Status: He is alert.  Psychiatric:        Mood and Affect: Mood is not anxious.     ED Results / Procedures / Treatments   Labs (all labs ordered are listed, but only abnormal results are displayed) Labs Reviewed - No data to display  EKG None  Radiology IR NEPHROSTOMY EXCHANGE RIGHT  Result Date: 03/04/2021 INDICATION: 75 year old with bladder cancer and right percutaneous nephrostomy tube was placed on 12/28/2020. Patient presents for catheter maintenance and exchange. EXAM: RIGHT NEPHROSTOMY TUBE EXCHANGE WITH FLUOROSCOPY Physician: Stephan Minister. Anselm Pancoast, MD COMPARISON:  None. MEDICATIONS: Local anesthetic, 1% lidocaine ANESTHESIA/SEDATION: None CONTRAST:  10 mL Omnipaque 300-administered into the collecting system(s) FLUOROSCOPY TIME:  Fluoroscopy Time: 30 seconds, 4 mGy COMPLICATIONS: None immediate. PROCEDURE: The procedure was explained to the patient. The risks and benefits of the procedure were discussed and the patient's questions were addressed. Informed consent was obtained from the patient. Patient was placed prone on the interventional table. Right flank was prepped and draped in sterile fashion. Maximal barrier sterile technique was utilized including caps, mask, sterile gowns, sterile  gloves, sterile drape, hand hygiene and skin antiseptic. Nephrostomy tube was injected with contrast to confirm placement in the renal collecting system. The catheter was cut and removed over a superstiff Amplatz wire. New 10 French multipurpose drain was advanced over the wire and positioned in the renal pelvis. Skin was anesthetized with 1% lidocaine. Catheter was sutured to skin. Catheter was attached to a gravity bag. Fluoroscopic images were taken and saved for this procedure. FINDINGS: Nephrostomy tube had pulled back into a calyx. New 10 French nephrostomy tube is well positioned in the renal pelvis. Again noted is a dilated renal pelvis. IMPRESSION: Successful exchange of the right percutaneous nephrostomy tube. Electronically Signed   By: Markus Daft M.D.   On: 03/04/2021 16:41    Procedures Procedures   Medications Ordered in ED Medications - No data to display  ED Course  I have reviewed the triage vital signs and the nursing notes.  Pertinent labs & imaging results that were available during my care of the patient were reviewed by me and considered in my medical decision making (see chart for details).    MDM Rules/Calculators/A&P                          Based on history and exam this is likely a reaction to when he got his IV infusion 2 days ago and likely is phlebitis.  Does not go past his elbow.  It actually seems to be improving since yesterday.  He has no fever and the area is not particularly warm or tender to suggest this is an infection.  We discussed doing an ultrasound which initially he agreed to  but then later wants to leave and return if he gets worse.  I think this is pretty reasonable.  Unfortunately he cannot tolerate NSAIDs so he will be left with Tylenol and compresses.  However my suspicion for DVT is lower and I think it is reasonable to return if symptoms worsen. No neurovascular compromise. Very low suspicion for compartment syndrome. Final Clinical Impression(s)  / ED Diagnoses Final diagnoses:  Phlebitis after infusion, initial encounter    Rx / DC Orders ED Discharge Orders     None        Sherwood Gambler, MD 03/05/21 1431

## 2021-03-08 ENCOUNTER — Ambulatory Visit (HOSPITAL_COMMUNITY)
Admission: RE | Admit: 2021-03-08 | Discharge: 2021-03-08 | Disposition: A | Discharge status: Home / Self Care | Payer: Medicare Other | Source: Ambulatory Visit | Attending: Interventional Radiology | Admitting: Interventional Radiology

## 2021-03-08 ENCOUNTER — Telehealth: Payer: Self-pay | Admitting: *Deleted

## 2021-03-08 ENCOUNTER — Other Ambulatory Visit (HOSPITAL_COMMUNITY): Payer: Self-pay | Admitting: Interventional Radiology

## 2021-03-08 DIAGNOSIS — Z436 Encounter for attention to other artificial openings of urinary tract: Secondary | ICD-10-CM | POA: Diagnosis not present

## 2021-03-08 HISTORY — PX: IR PATIENT EVAL TECH 0-60 MINS: IMG5564

## 2021-03-08 NOTE — Telephone Encounter (Signed)
Received 2 faxes from Access Nurse. Pt called on 7/2 and 7/4. Experiencing redness and swelling on left forearm post IV chemo. Was sent to ED on 7/2. Called on 7/4 to say he has a lump at the insertion site  3-4 inches in length, "no redness and decrease in swelling reported. No pain reported"   [facility] RN called patient regarding arm. States he is still swollen. Will come by to have RN assess site. Left forearm has raised area along vein ~ 4 inches. Denies pain, only slight redness. Instructed to use warm wet washcloth compress to site 4 times per day. Pt comes Thursday for Gemzar. To call if any swelling develops in hand- reviewed signs of DVT.

## 2021-03-10 ENCOUNTER — Other Ambulatory Visit: Payer: Self-pay

## 2021-03-10 ENCOUNTER — Inpatient Hospital Stay: Payer: Medicare Other

## 2021-03-10 ENCOUNTER — Inpatient Hospital Stay: Payer: Medicare Other | Attending: Oncology

## 2021-03-10 DIAGNOSIS — C679 Malignant neoplasm of bladder, unspecified: Secondary | ICD-10-CM | POA: Diagnosis not present

## 2021-03-10 DIAGNOSIS — Z5111 Encounter for antineoplastic chemotherapy: Secondary | ICD-10-CM | POA: Insufficient documentation

## 2021-03-10 DIAGNOSIS — C678 Malignant neoplasm of overlapping sites of bladder: Secondary | ICD-10-CM

## 2021-03-10 DIAGNOSIS — N130 Hydronephrosis with ureteropelvic junction obstruction: Secondary | ICD-10-CM | POA: Diagnosis not present

## 2021-03-10 DIAGNOSIS — R3914 Feeling of incomplete bladder emptying: Secondary | ICD-10-CM | POA: Diagnosis not present

## 2021-03-10 LAB — CBC WITH DIFFERENTIAL (CANCER CENTER ONLY)
Abs Immature Granulocytes: 0 10*3/uL (ref 0.00–0.07)
Basophils Absolute: 0 10*3/uL (ref 0.0–0.1)
Basophils Relative: 1 %
Eosinophils Absolute: 0 10*3/uL (ref 0.0–0.5)
Eosinophils Relative: 1 %
HCT: 23.9 % — ABNORMAL LOW (ref 39.0–52.0)
Hemoglobin: 8.3 g/dL — ABNORMAL LOW (ref 13.0–17.0)
Immature Granulocytes: 0 %
Lymphocytes Relative: 44 %
Lymphs Abs: 0.7 10*3/uL (ref 0.7–4.0)
MCH: 31.1 pg (ref 26.0–34.0)
MCHC: 34.7 g/dL (ref 30.0–36.0)
MCV: 89.5 fL (ref 80.0–100.0)
Monocytes Absolute: 0.2 10*3/uL (ref 0.1–1.0)
Monocytes Relative: 11 %
Neutro Abs: 0.7 10*3/uL — ABNORMAL LOW (ref 1.7–7.7)
Neutrophils Relative %: 43 %
Platelet Count: 163 10*3/uL (ref 150–400)
RBC: 2.67 MIL/uL — ABNORMAL LOW (ref 4.22–5.81)
RDW: 15.9 % — ABNORMAL HIGH (ref 11.5–15.5)
WBC Count: 1.5 10*3/uL — ABNORMAL LOW (ref 4.0–10.5)
nRBC: 0 % (ref 0.0–0.2)

## 2021-03-10 LAB — CMP (CANCER CENTER ONLY)
ALT: 25 U/L (ref 0–44)
AST: 28 U/L (ref 15–41)
Albumin: 3.2 g/dL — ABNORMAL LOW (ref 3.5–5.0)
Alkaline Phosphatase: 77 U/L (ref 38–126)
Anion gap: 8 (ref 5–15)
BUN: 12 mg/dL (ref 8–23)
CO2: 26 mmol/L (ref 22–32)
Calcium: 9 mg/dL (ref 8.9–10.3)
Chloride: 91 mmol/L — ABNORMAL LOW (ref 98–111)
Creatinine: 0.83 mg/dL (ref 0.61–1.24)
GFR, Estimated: >60 mL/min (ref >60)
Glucose, Bld: 95 mg/dL (ref 70–99)
Potassium: 4 mmol/L (ref 3.5–5.1)
Sodium: 125 mmol/L — ABNORMAL LOW (ref 135–145)
Total Bilirubin: 0.3 mg/dL (ref 0.3–1.2)
Total Protein: 6.8 g/dL (ref 6.5–8.1)

## 2021-03-10 NOTE — Patient Instructions (Signed)
Neutropenia Neutropenia is a condition that occurs when you have a lower-than-normal level of a type of white blood cell (neutrophil) in your body. Neutrophils are made in the spongy center of large bones (bone marrow), and they fight infections. Neutrophils are your body's main defense against bacterial and fungal infections. The fewer neutrophils you have and the longer your body remainswithout them, the greater your risk of getting a severe infection. What are the causes? This condition can occur if your body uses up or destroys neutrophils faster than your bone marrow can make them. Neutropenia may be caused by: A bacterial or fungal infection. Allergic disorders. Reactions to some medicines. An autoimmune disease. An enlarged spleen. This condition can also occur if your bone marrow does not produce enough neutrophils. This problem may be caused by: Cancer. Cancer treatments, such as radiation or chemotherapy. Viral infections. Medicines, such as phenytoin. Vitamin B12 deficiency. Diseases of the bone marrow. Environmental toxins, such as insecticides. What are the signs or symptoms? This condition does not usually cause symptoms. If symptoms are present, they are usually caused by an underlying infection. Symptoms of an infection may include: Fever. Chills. Swollen glands. Oral or anal ulcers. Cough and shortness of breath. Rash. Skin infection. Fatigue. How is this diagnosed? Your health care provider may suspect neutropenia if you have: A condition that may cause neutropenia. Symptoms during or after treatment for cancer. Symptoms of infection, especially fever. Frequent and unusual infections. This condition is diagnosed based on your medical history and a physical exam. Tests will also be done, such as: A complete blood count (CBC). A procedure to collect a sample of bone marrow for examination (bone marrow biopsy). A chest X-ray. A urine culture. A blood  culture. How is this treated? Treatment depends on the underlying cause and severity of your condition. Mild neutropenia may not require treatment. Treatment may include medicines, such as: Antibiotic medicine given through an IV. Antiviral medicines. Antifungal medicines. A medicine to increase neutrophil production (colony-stimulating factor). You may get this drug through an IV or by injection. Steroids given through an IV. If an underlying condition is causing neutropenia, you may need treatment for that condition. If medicines or cancer treatments are causing neutropenia, yourhealth care provider may have you stop the medicines or treatment. Follow these instructions at home: Medicines  Take over-the-counter and prescription medicines only as told by your health care provider. Get a seasonal flu shot (influenza vaccine). Avoid people who received a vaccine in the past 30 days if that vaccine contained a live version of the germ (live vaccine). You should not get a live vaccine. Common live vaccines are polio, MMR, chicken pox, and shingles vaccines.  Eating and drinking Do not share food utensils. Do not eat unpasteurized foods. Do not eat raw or undercooked meat, eggs, or seafood. Do not eat unwashed, raw fruits or vegetables. Lifestyle Avoid exposure to groups of people or children. Avoid being around people who are sick. Avoid being around dirt or dust, such as in construction areas or gardens. Do not provide direct care for pets. Avoid animal droppings. Do not clean litter boxes and bird cages. Do not have sex unless your health care provider has approved. Hygiene  Bathe daily. Clean the area between the genitals and the anus (perineal area) after you urinate or have a bowel movement. If you are male, wipe from front to back. Brush your teeth with a soft toothbrush before and after meals. Do not use a regular razor. Use  an Copy to remove hair. Wash your hands  often. Make sure others who come in contact with you also wash their hands. If soap and water are not available, use hand sanitizer.  General instructions Follow any precautions as told by your health care provider to reduce your risk for injury or infection. Take actions to avoid cuts and burns. For example: Be cautious when you use knives. Always cut away from yourself. Keep knives in protective sheaths or guards when not in use. Use oven mitts when you cook with a hot stove, oven, or grill. Stand a safe distance away from open fires. Do not use tampons, enemas, or rectal suppositories unless your health care provider has approved. Keep all follow-up visits as told by your health care provider. This is important. Contact a health care provider if: You have: A sore throat. A warm, red, or tender area on your skin. A cough. Frequent or painful urination. Vaginal discharge or itching. You develop: Sores in your mouth or anus. Swollen lymph nodes. Red streaks on the skin. A rash. Get help right away if: You have: A fever. Chills, or you start to shake. You feel: Nauseous, or you vomit. Very fatigued. Short of breath. Summary Neutropenia is a condition that occurs when you have a lower-than-normal level of a type of white blood cell (neutrophil) in your body. This condition can occur if your body uses up or destroys neutrophils faster than your bone marrow can make them. Treatment depends on the underlying cause and severity of your condition. Mild neutropenia may not require treatment. Follow any precautions as told by your health care provider to reduce your risk for injury or infection. This information is not intended to replace advice given to you by your health care provider. Make sure you discuss any questions you have with your healthcare provider. Document Revised: 06/06/2018 Document Reviewed: 06/06/2018 Elsevier Patient Education  [place].

## 2021-03-10 NOTE — Progress Notes (Signed)
Labs resulted back wbc 1.5, anc 0.7, hgb 8.3. Provider Dr. Alen Blew made aware, treatment to be held today. Inquired if patient needs to come back next week, provider states no. Patient is scheduled to rtc on 7/21. Patient educated on signs and symptoms of infection. Patient informed to call clinic immediately if he develops a fever or notices increasing shortness of breath.

## 2021-03-15 DIAGNOSIS — E785 Hyperlipidemia, unspecified: Secondary | ICD-10-CM | POA: Diagnosis not present

## 2021-03-15 DIAGNOSIS — K219 Gastro-esophageal reflux disease without esophagitis: Secondary | ICD-10-CM | POA: Diagnosis not present

## 2021-03-15 DIAGNOSIS — E039 Hypothyroidism, unspecified: Secondary | ICD-10-CM | POA: Diagnosis not present

## 2021-03-15 DIAGNOSIS — M1990 Unspecified osteoarthritis, unspecified site: Secondary | ICD-10-CM | POA: Diagnosis not present

## 2021-03-15 DIAGNOSIS — I809 Phlebitis and thrombophlebitis of unspecified site: Secondary | ICD-10-CM | POA: Diagnosis not present

## 2021-03-15 DIAGNOSIS — G620 Drug-induced polyneuropathy: Secondary | ICD-10-CM | POA: Diagnosis not present

## 2021-03-15 DIAGNOSIS — R109 Unspecified abdominal pain: Secondary | ICD-10-CM | POA: Diagnosis not present

## 2021-03-17 ENCOUNTER — Encounter: Payer: Self-pay | Admitting: Oncology

## 2021-03-22 ENCOUNTER — Other Ambulatory Visit (HOSPITAL_COMMUNITY): Payer: Self-pay | Admitting: Interventional Radiology

## 2021-03-22 ENCOUNTER — Encounter (HOSPITAL_COMMUNITY): Payer: Self-pay

## 2021-03-22 NOTE — Procedures (Signed)
Pt came in to have nephrostomy drain dressing changed at this time. 03/08/21

## 2021-03-22 NOTE — Procedures (Signed)
Pt came into radiology to have his dressing changed on 03/08/21.

## 2021-03-23 DIAGNOSIS — M542 Cervicalgia: Secondary | ICD-10-CM | POA: Diagnosis not present

## 2021-03-24 ENCOUNTER — Inpatient Hospital Stay (HOSPITAL_BASED_OUTPATIENT_CLINIC_OR_DEPARTMENT_OTHER): Payer: Medicare Other | Admitting: Oncology

## 2021-03-24 ENCOUNTER — Other Ambulatory Visit: Payer: Self-pay

## 2021-03-24 ENCOUNTER — Inpatient Hospital Stay: Payer: Medicare Other

## 2021-03-24 VITALS — BP 161/85 | HR 95 | Temp 98.2°F | Resp 19 | Ht 72.0 in | Wt 193.0 lb

## 2021-03-24 DIAGNOSIS — C678 Malignant neoplasm of overlapping sites of bladder: Secondary | ICD-10-CM

## 2021-03-24 DIAGNOSIS — Z5111 Encounter for antineoplastic chemotherapy: Secondary | ICD-10-CM | POA: Diagnosis not present

## 2021-03-24 DIAGNOSIS — C679 Malignant neoplasm of bladder, unspecified: Secondary | ICD-10-CM | POA: Diagnosis not present

## 2021-03-24 LAB — CBC WITH DIFFERENTIAL (CANCER CENTER ONLY)
Abs Immature Granulocytes: 0.11 10*3/uL — ABNORMAL HIGH (ref 0.00–0.07)
Basophils Absolute: 0 10*3/uL (ref 0.0–0.1)
Basophils Relative: 0 %
Eosinophils Absolute: 0.1 10*3/uL (ref 0.0–0.5)
Eosinophils Relative: 1 %
HCT: 25.8 % — ABNORMAL LOW (ref 39.0–52.0)
Hemoglobin: 8.9 g/dL — ABNORMAL LOW (ref 13.0–17.0)
Immature Granulocytes: 1 %
Lymphocytes Relative: 16 %
Lymphs Abs: 1.3 10*3/uL (ref 0.7–4.0)
MCH: 30.8 pg (ref 26.0–34.0)
MCHC: 34.5 g/dL (ref 30.0–36.0)
MCV: 89.3 fL (ref 80.0–100.0)
Monocytes Absolute: 1.6 10*3/uL — ABNORMAL HIGH (ref 0.1–1.0)
Monocytes Relative: 19 %
Neutro Abs: 5 10*3/uL (ref 1.7–7.7)
Neutrophils Relative %: 63 %
Platelet Count: 371 10*3/uL (ref 150–400)
RBC: 2.89 MIL/uL — ABNORMAL LOW (ref 4.22–5.81)
RDW: 16.6 % — ABNORMAL HIGH (ref 11.5–15.5)
WBC Count: 8.1 10*3/uL (ref 4.0–10.5)
nRBC: 0 % (ref 0.0–0.2)

## 2021-03-24 LAB — CMP (CANCER CENTER ONLY)
ALT: 15 U/L (ref 0–44)
AST: 21 U/L (ref 15–41)
Albumin: 3.3 g/dL — ABNORMAL LOW (ref 3.5–5.0)
Alkaline Phosphatase: 64 U/L (ref 38–126)
Anion gap: 14 (ref 5–15)
BUN: 18 mg/dL (ref 8–23)
CO2: 24 mmol/L (ref 22–32)
Calcium: 9.3 mg/dL (ref 8.9–10.3)
Chloride: 90 mmol/L — ABNORMAL LOW (ref 98–111)
Creatinine: 0.98 mg/dL (ref 0.61–1.24)
GFR, Estimated: >60 mL/min (ref >60)
Glucose, Bld: 147 mg/dL — ABNORMAL HIGH (ref 70–99)
Potassium: 3.8 mmol/L (ref 3.5–5.1)
Sodium: 128 mmol/L — ABNORMAL LOW (ref 135–145)
Total Bilirubin: 0.4 mg/dL (ref 0.3–1.2)
Total Protein: 7.3 g/dL (ref 6.5–8.1)

## 2021-03-24 MED ORDER — PALONOSETRON HCL INJECTION 0.25 MG/5ML
INTRAVENOUS | Status: AC
  Filled 2021-03-24: qty 5

## 2021-03-24 MED ORDER — MAGNESIUM SULFATE 2 GM/50ML IV SOLN
INTRAVENOUS | Status: AC
  Filled 2021-03-24: qty 50

## 2021-03-24 MED ORDER — SODIUM CHLORIDE 0.9 % IV SOLN
150.0000 mg | Freq: Once | INTRAVENOUS | Status: AC
  Administered 2021-03-24: 150 mg via INTRAVENOUS
  Filled 2021-03-24: qty 150

## 2021-03-24 MED ORDER — DEXAMETHASONE SODIUM PHOSPHATE 100 MG/10ML IJ SOLN
10.0000 mg | Freq: Once | INTRAMUSCULAR | Status: AC
  Administered 2021-03-24: 10 mg via INTRAVENOUS
  Filled 2021-03-24: qty 10

## 2021-03-24 MED ORDER — PALONOSETRON HCL INJECTION 0.25 MG/5ML
0.2500 mg | Freq: Once | INTRAVENOUS | Status: AC
Start: 2021-03-24 — End: 2021-03-24
  Administered 2021-03-24: 0.25 mg via INTRAVENOUS

## 2021-03-24 MED ORDER — SODIUM CHLORIDE 0.9 % IV SOLN
Freq: Once | INTRAVENOUS | Status: AC
  Filled 2021-03-24: qty 250

## 2021-03-24 MED ORDER — SODIUM CHLORIDE 0.9 % IV SOLN
800.0000 mg/m2 | Freq: Once | INTRAVENOUS | Status: AC
  Administered 2021-03-24: 1710 mg via INTRAVENOUS
  Filled 2021-03-24: qty 44.97

## 2021-03-24 MED ORDER — MAGNESIUM SULFATE 2 GM/50ML IV SOLN
2.0000 g | Freq: Once | INTRAVENOUS | Status: AC
  Administered 2021-03-24: 2 g via INTRAVENOUS

## 2021-03-24 MED ORDER — SODIUM CHLORIDE 0.9 % IV SOLN
50.0000 mg/m2 | Freq: Once | INTRAVENOUS | Status: AC
  Administered 2021-03-24: 107 mg via INTRAVENOUS
  Filled 2021-03-24: qty 107

## 2021-03-24 MED ORDER — POTASSIUM CHLORIDE IN NACL 20-0.9 MEQ/L-% IV SOLN
Freq: Once | INTRAVENOUS | Status: AC
Start: 2021-03-24 — End: 2021-03-24
  Filled 2021-03-24: qty 1000

## 2021-03-24 NOTE — Patient Instructions (Signed)
[facility]COL[facility]  Discharge Instructions: Thank you for choosing [facility] to provide your oncology and hematology care.   If you have a lab appointment with the [facility], please go directly to the [facility] and check in at the registration area.   Wear comfortable clothing and clothing appropriate for easy access to any Portacath or PICC line.   We strive to give you quality time with your provider. You may need to reschedule your appointment if you arrive late (15 or more minutes).  Arriving late affects you and other patients whose appointments are after yours.  Also, if you miss three or more appointments without notifying the office, you may be dismissed from the clinic at the provider's discretion.      For prescription refill requests, have your pharmacy contact our office and allow 72 hours for refills to be completed.    Today you received the following chemotherapy and/or immunotherapy agents: Gemzar/Cisplatin.      To help prevent nausea and vomiting after your treatment, we encourage you to take your nausea medication as directed.  BELOW ARE SYMPTOMS THAT SHOULD BE REPORTED IMMEDIATELY: *FEVER GREATER THAN 100.4 F (38 C) OR HIGHER *CHILLS OR SWEATING *NAUSEA AND VOMITING THAT IS NOT CONTROLLED WITH YOUR NAUSEA MEDICATION *UNUSUAL SHORTNESS OF BREATH *UNUSUAL BRUISING OR BLEEDING *URINARY PROBLEMS (pain or burning when urinating, or frequent urination) *BOWEL PROBLEMS (unusual diarrhea, constipation, pain near the anus) TENDERNESS IN MOUTH AND THROAT WITH OR WITHOUT PRESENCE OF ULCERS (sore throat, sores in mouth, or a toothache) UNUSUAL RASH, SWELLING OR PAIN  UNUSUAL VAGINAL DISCHARGE OR ITCHING   Items with * indicate a potential emergency and should be followed up as soon as possible or go to the Emergency Department if any problems should occur.  Please show the CHEMOTHERAPY ALERT CARD or IMMUNOTHERAPY ALERT CARD at  check-in to the Emergency Department and triage nurse.  Should you have questions after your visit or need to cancel or reschedule your appointment, please contact [facility]  Dept: [phone]  and follow the prompts.  Office hours are 8:00 a.m. to 4:30 p.m. Monday - Friday. Please note that voicemails left after 4:00 p.m. may not be returned until the following business day.  We are closed weekends and major holidays. You have access to a nurse at all times for urgent questions. Please call the main number to the clinic Dept: [phone] and follow the prompts.   For any non-urgent questions, you may also contact your provider using MyChart. We now offer e-Visits for anyone 29 and older to request care online for non-urgent symptoms. For details visit [email or web address].   Also download the MyChart app! Go to the app store, search "MyChart", open the app, select [facility], and log in with your MyChart username and password.  Due to Covid, a mask is required upon entering the hospital/clinic. If you do not have a mask, one will be given to you upon arrival. For doctor visits, patients may have 1 support person aged 57 or older with them. For treatment visits, patients cannot have anyone with them due to current Covid guidelines and our immunocompromised population.

## 2021-03-24 NOTE — Progress Notes (Signed)
Hematology and Oncology Follow Up Visit  Lance Friedman [id number] 05-22-1946 75 y.o. 03/24/2021 8:05 AM Lance Friedman, MDGates, Lance Baltimore, MD   Principle Diagnosis: 75 year old man with bladder cancer diagnosed with T2N0 high-grade urothelial carcinoma in April 2022.     Prior Therapy:  He is status post TURBT on December 21, 2020 with the pathology showed high-grade urothelial carcinoma with muscle invasion.  Current therapy: Neoadjuvant chemotherapy utilizing gemcitabine and cisplatin started on Jan 20, 2021.  He is here for day 1 of cycle 4 of therapy.    Interim History: Lance Friedman returns today for a follow-up visit.  Since the last visit, he reports no major changes in his health.  He continues to tolerate chemotherapy without any major complaints.  He did have follow-up phlebitis associated with the IV access last time which has resolved at this time.  He did report some neck pain related to a pinched nerve or received any injection by his primary care provider.  He denies any nausea, vomiting, neuropathy or worsening fatigue.  His appetite is excellent and his performance status is unchanged.     Medications: Reviewed without changes. Current Outpatient Medications  Medication Sig Dispense Refill   acetaminophen (TYLENOL) 500 MG tablet Take 1,000 mg by mouth every 6 (six) hours as needed. (Patient not taking: Reported on 01/27/2021)     co-enzyme Q-10 30 MG capsule Take 30 mg by mouth daily.     FENUGREEK PO Take by mouth. 1-2 tabs per day     Ginseng 100 MG CAPS Take by mouth. 1-2 capsules daily     glucosamine-chondroitin 500-400 MG tablet Take 1 tablet by mouth daily. 3 tabs daily Glucosamine with msm     levothyroxine (SYNTHROID) 112 MCG tablet Take 112 mcg by mouth daily before breakfast.     Multiple Vitamin (MULTIVITAMIN) tablet Take 1 tablet by mouth daily.     NON FORMULARY Achanesia golden seal 2 tabs daily     Omega-3 Fatty Acids (FISH OIL PO) Take 1 capsule by mouth  daily. Salmon oil 2 -3 tabs daily, 1 in an 2 in pm 1000 mg each     omeprazole (PRILOSEC) 20 MG capsule Take 20 mg by mouth 2 (two) times daily before a meal.     OVER THE COUNTER MEDICATION Apply 1 application topically daily as needed (icy hot balm to neck prn and right shoulder prn).     OVER THE COUNTER MEDICATION Calcium magnesium zinc 3 tabs daily     pravastatin (PRAVACHOL) 20 MG tablet Take 20 mg by mouth at bedtime.      prochlorperazine (COMPAZINE) 10 MG tablet Take 1 tablet (10 mg total) by mouth every 6 (six) hours as needed for nausea or vomiting. 30 tablet 0   UNABLE TO FIND Probiotic daily     UNABLE TO FIND Advanced super beta prostate  1 tab bid     VALERIAN ROOT PO Take by mouth at bedtime as needed.     vitamin C (ASCORBIC ACID) 500 MG tablet Take 500 mg by mouth daily. 2 tabs daily     VITAMIN E PO Take 1 tablet by mouth daily. 1000 mg daily     No current facility-administered medications for this visit.     Allergies:  Allergies  Allergen Reactions   Aleve [Naproxen]      Cannot take Naproxen sodium due to past stomach damage       Physical Exam:  Blood pressure (!) 161/85, pulse 95, temperature  98.2 F (36.8 C), temperature source Oral, resp. rate 19, height 6' (1.829 m), weight 193 lb (87.5 kg), SpO2 100 %.   ECOG: 1   General appearance: Alert, awake without any distress. Head: Atraumatic without abnormalities Oropharynx: Without any thrush or ulcers. Eyes: No scleral icterus. Lymph nodes: No lymphadenopathy noted in the cervical, supraclavicular, or axillary nodes Heart:regular rate and rhythm, without any murmurs or gallops.   Lung: Clear to auscultation without any rhonchi, wheezes or dullness to percussion. Abdomin: Soft, nontender without any shifting dullness or ascites. Musculoskeletal: No clubbing or cyanosis. Neurological: No motor or sensory deficits. Skin: No rashes or lesions.     Lab Results: Lab Results  Component Value Date    WBC 1.5 (L) 03/10/2021   HGB 8.3 (L) 03/10/2021   HCT 23.9 (L) 03/10/2021   MCV 89.5 03/10/2021   PLT 163 03/10/2021     Chemistry      Component Value Date/Time   NA 125 (L) 03/10/2021 1213   K 4.0 03/10/2021 1213   CL 91 (L) 03/10/2021 1213   CO2 26 03/10/2021 1213   BUN 12 03/10/2021 1213   CREATININE 0.83 03/10/2021 1213      Component Value Date/Time   CALCIUM 9.0 03/10/2021 1213   ALKPHOS 77 03/10/2021 1213   AST 28 03/10/2021 1213   ALT 25 03/10/2021 1213   BILITOT 0.3 03/10/2021 1213          Impression and Plan:   75 year old man with:   1.  T2N0 high-grade urothelial carcinoma of the bladder diagnosed in April 2022.   He is currently receiving neoadjuvant chemotherapy utilizing gemcitabine and cisplatin without any major complications.  Risks and benefits of proceeding with cycle 4 were discussed at this time.  After completing this cycle we will update his staging scans and will determine best course of action moving forward.  Definitive therapy with a radical cystectomy is the preferred method radiation with chemotherapy could also be used if he opted against surgery.  Laboratory data from today reviewed and has adequate hematological parameters and ready to proceed with chemotherapy.     2.  IV access: peripheral veins are currently in use without any issues.   3.  Antiemetics: No nausea or vomiting reported at this time.  Compazine is available to him.   4.  Renal function surveillance: His kidney function remains intact on platinum therapy.  We will continue to monitor.  5.  Neutropenia: Gemcitabine day 8 was withheld with the previous cycle.  We will adjust chemotherapy dosing accordingly.  His white cell count has recovered appropriately to receive chemotherapy without any delay.   6.  Goals of care: His disease remains incurable with aggressive measures are warranted.   7.  Follow-up: He will return in 1 week to complete the current cycle and in  4 weeks to update his staging scans.     30  minutes were spent on this encounter.  The time was dedicated to review of laboratory data, disease status update, treatment choices and complications related to therapy.        Zola Button, MD 7/21/20228:05 AM

## 2021-03-25 ENCOUNTER — Ambulatory Visit (INDEPENDENT_AMBULATORY_CARE_PROVIDER_SITE_OTHER): Payer: Medicare Other | Admitting: Podiatry

## 2021-03-25 ENCOUNTER — Ambulatory Visit: Payer: Medicare Other

## 2021-03-25 VITALS — BP 137/76 | HR 91 | Temp 98.1°F

## 2021-03-25 DIAGNOSIS — G620 Drug-induced polyneuropathy: Secondary | ICD-10-CM | POA: Diagnosis not present

## 2021-03-25 DIAGNOSIS — T451X5A Adverse effect of antineoplastic and immunosuppressive drugs, initial encounter: Secondary | ICD-10-CM

## 2021-03-25 DIAGNOSIS — M79671 Pain in right foot: Secondary | ICD-10-CM

## 2021-03-25 DIAGNOSIS — M659 Synovitis and tenosynovitis, unspecified: Secondary | ICD-10-CM | POA: Diagnosis not present

## 2021-03-25 DIAGNOSIS — M79672 Pain in left foot: Secondary | ICD-10-CM

## 2021-03-31 ENCOUNTER — Inpatient Hospital Stay: Payer: Medicare Other

## 2021-03-31 ENCOUNTER — Other Ambulatory Visit: Payer: Self-pay

## 2021-03-31 VITALS — BP 145/98 | HR 90 | Temp 97.8°F | Resp 18 | Ht 72.0 in | Wt 188.1 lb

## 2021-03-31 DIAGNOSIS — C679 Malignant neoplasm of bladder, unspecified: Secondary | ICD-10-CM | POA: Diagnosis not present

## 2021-03-31 DIAGNOSIS — Z5111 Encounter for antineoplastic chemotherapy: Secondary | ICD-10-CM | POA: Diagnosis not present

## 2021-03-31 DIAGNOSIS — C678 Malignant neoplasm of overlapping sites of bladder: Secondary | ICD-10-CM

## 2021-03-31 LAB — CBC WITH DIFFERENTIAL (CANCER CENTER ONLY)
Abs Immature Granulocytes: 0.01 10*3/uL (ref 0.00–0.07)
Basophils Absolute: 0 10*3/uL (ref 0.0–0.1)
Basophils Relative: 0 %
Eosinophils Absolute: 0 10*3/uL (ref 0.0–0.5)
Eosinophils Relative: 0 %
HCT: 23.4 % — ABNORMAL LOW (ref 39.0–52.0)
Hemoglobin: 8.2 g/dL — ABNORMAL LOW (ref 13.0–17.0)
Immature Granulocytes: 0 %
Lymphocytes Relative: 33 %
Lymphs Abs: 1 10*3/uL (ref 0.7–4.0)
MCH: 30.9 pg (ref 26.0–34.0)
MCHC: 35 g/dL (ref 30.0–36.0)
MCV: 88.3 fL (ref 80.0–100.0)
Monocytes Absolute: 0.5 10*3/uL (ref 0.1–1.0)
Monocytes Relative: 15 %
Neutro Abs: 1.5 10*3/uL — ABNORMAL LOW (ref 1.7–7.7)
Neutrophils Relative %: 52 %
Platelet Count: 115 10*3/uL — ABNORMAL LOW (ref 150–400)
RBC: 2.65 MIL/uL — ABNORMAL LOW (ref 4.22–5.81)
RDW: 16.8 % — ABNORMAL HIGH (ref 11.5–15.5)
WBC Count: 3 10*3/uL — ABNORMAL LOW (ref 4.0–10.5)
nRBC: 0 % (ref 0.0–0.2)

## 2021-03-31 LAB — CMP (CANCER CENTER ONLY)
ALT: 18 U/L (ref 0–44)
AST: 19 U/L (ref 15–41)
Albumin: 3.2 g/dL — ABNORMAL LOW (ref 3.5–5.0)
Alkaline Phosphatase: 84 U/L (ref 38–126)
Anion gap: 9 (ref 5–15)
BUN: 16 mg/dL (ref 8–23)
CO2: 25 mmol/L (ref 22–32)
Calcium: 9.3 mg/dL (ref 8.9–10.3)
Chloride: 95 mmol/L — ABNORMAL LOW (ref 98–111)
Creatinine: 0.85 mg/dL (ref 0.61–1.24)
GFR, Estimated: >60 mL/min (ref >60)
Glucose, Bld: 100 mg/dL — ABNORMAL HIGH (ref 70–99)
Potassium: 4.4 mmol/L (ref 3.5–5.1)
Sodium: 129 mmol/L — ABNORMAL LOW (ref 135–145)
Total Bilirubin: 0.2 mg/dL — ABNORMAL LOW (ref 0.3–1.2)
Total Protein: 6.9 g/dL (ref 6.5–8.1)

## 2021-03-31 MED ORDER — PROCHLORPERAZINE MALEATE 10 MG PO TABS
ORAL_TABLET | ORAL | Status: AC
  Filled 2021-03-31: qty 1

## 2021-03-31 MED ORDER — PROCHLORPERAZINE MALEATE 10 MG PO TABS
10.0000 mg | ORAL_TABLET | Freq: Once | ORAL | Status: AC
Start: 2021-03-31 — End: 2021-03-31
  Administered 2021-03-31: 10 mg via ORAL

## 2021-03-31 MED ORDER — SODIUM CHLORIDE 0.9 % IV SOLN
800.0000 mg/m2 | Freq: Once | INTRAVENOUS | Status: AC
  Administered 2021-03-31: 1710 mg via INTRAVENOUS
  Filled 2021-03-31: qty 44.97

## 2021-03-31 MED ORDER — SODIUM CHLORIDE 0.9 % IV SOLN
Freq: Once | INTRAVENOUS | Status: AC
  Filled 2021-03-31: qty 250

## 2021-03-31 NOTE — Patient Instructions (Signed)
[facility]  Discharge Instructions: Thank you for choosing [facility] to provide your oncology and hematology care.   If you have a lab appointment with the Cancer Center, please go directly to the Cancer Center and check in at the registration area.   Wear comfortable clothing and clothing appropriate for easy access to any Portacath or PICC line.   We strive to give you quality time with your provider. You may need to reschedule your appointment if you arrive late (15 or more minutes).  Arriving late affects you and other patients whose appointments are after yours.  Also, if you miss three or more appointments without notifying the office, you may be dismissed from the clinic at the provider's discretion.      For prescription refill requests, have your pharmacy contact our office and allow 72 hours for refills to be completed.    Today you received the following chemotherapy and/or immunotherapy agents Gemzar      To help prevent nausea and vomiting after your treatment, we encourage you to take your nausea medication as directed.  BELOW ARE SYMPTOMS THAT SHOULD BE REPORTED IMMEDIATELY: *FEVER GREATER THAN 100.4 F (38 C) OR HIGHER *CHILLS OR SWEATING *NAUSEA AND VOMITING THAT IS NOT CONTROLLED WITH YOUR NAUSEA MEDICATION *UNUSUAL SHORTNESS OF BREATH *UNUSUAL BRUISING OR BLEEDING *URINARY PROBLEMS (pain or burning when urinating, or frequent urination) *BOWEL PROBLEMS (unusual diarrhea, constipation, pain near the anus) TENDERNESS IN MOUTH AND THROAT WITH OR WITHOUT PRESENCE OF ULCERS (sore throat, sores in mouth, or a toothache) UNUSUAL RASH, SWELLING OR PAIN  UNUSUAL VAGINAL DISCHARGE OR ITCHING   Items with * indicate a potential emergency and should be followed up as soon as possible or go to the Emergency Department if any problems should occur.  Please show the CHEMOTHERAPY ALERT CARD or IMMUNOTHERAPY ALERT CARD at check-in to the  Emergency Department and triage nurse.  Should you have questions after your visit or need to cancel or reschedule your appointment, please contact [facility]  Dept: [phone]  and follow the prompts.  Office hours are 8:00 a.m. to 4:30 p.m. Monday - Friday. Please note that voicemails left after 4:00 p.m. may not be returned until the following business day.  We are closed weekends and major holidays. You have access to a nurse at all times for urgent questions. Please call the main number to the clinic Dept: [phone] and follow the prompts.   For any non-urgent questions, you may also contact your provider using MyChart. We now offer e-Visits for anyone 18 and older to request care online for non-urgent symptoms. For details visit [email or web address].   Also download the MyChart app! Go to the app store, search "MyChart", open the app, select [facility], and log in with your MyChart username and password.  Due to Covid, a mask is required upon entering the hospital/clinic. If you do not have a mask, one will be given to you upon arrival. For doctor visits, patients may have 1 support person aged 18 or older with them. For treatment visits, patients cannot have anyone with them due to current Covid guidelines and our immunocompromised population.   

## 2021-04-01 NOTE — Progress Notes (Signed)
  Subjective:  Patient ID: Lance Friedman, male    DOB: 03-Oct-1945,  MRN: [id number]  Chief Complaint  Patient presents with   Foot Pain    Patient reports stiffness and Numbness in the feet. Patient is currently doing Chemo treated and was advised by provider that the numbness could be related to the current treatment.    75 y.o. male presents with the above complaint. History confirmed with patient.   Objective:  Physical Exam: warm, good capillary refill, no trophic changes or ulcerative lesions, normal DP and PT pulses, and normal sensory exam. Left Foot: normal exam, no swelling, tenderness, instability; ligaments intact, full range of motion of all ankle/foot joints  Right Foot: normal exam, no swelling, tenderness, instability; ligaments intact, full range of motion of all ankle/foot joints   No images are attached to the encounter.  Radiographs: X-ray of both feet: no fracture, dislocation, swelling or degenerative changes noted Assessment:   1. Synovitis and tenosynovitis   2. Chemotherapy-induced neuropathy ([place])      Plan:  Patient was evaluated and treated and all questions answered.  Chemotherapy induced neuropathy -X-rays reviewed with patient no significant osseous deformities -He does appear that his symptoms are most likely due to chemotherapy.  Should he have issues after cessation of therapy we could further evaluate.  No follow-ups on file.

## 2021-04-04 ENCOUNTER — Telehealth: Payer: Self-pay | Admitting: Oncology

## 2021-04-04 NOTE — Telephone Encounter (Signed)
Called patient regarding upcoming appointments, patient is notified. 

## 2021-04-05 ENCOUNTER — Ambulatory Visit (HOSPITAL_COMMUNITY)
Admission: RE | Admit: 2021-04-05 | Discharge: 2021-04-05 | Disposition: A | Discharge status: Home / Self Care | Payer: Medicare Other | Source: Ambulatory Visit | Attending: Interventional Radiology | Admitting: Interventional Radiology

## 2021-04-05 ENCOUNTER — Other Ambulatory Visit (HOSPITAL_COMMUNITY): Payer: Self-pay | Admitting: Interventional Radiology

## 2021-04-05 ENCOUNTER — Other Ambulatory Visit: Payer: Self-pay

## 2021-04-05 ENCOUNTER — Encounter (HOSPITAL_COMMUNITY): Payer: Self-pay

## 2021-04-05 DIAGNOSIS — N1339 Other hydronephrosis: Secondary | ICD-10-CM

## 2021-04-11 DIAGNOSIS — N130 Hydronephrosis with ureteropelvic junction obstruction: Secondary | ICD-10-CM | POA: Diagnosis not present

## 2021-04-11 DIAGNOSIS — R3914 Feeling of incomplete bladder emptying: Secondary | ICD-10-CM | POA: Diagnosis not present

## 2021-04-11 DIAGNOSIS — C678 Malignant neoplasm of overlapping sites of bladder: Secondary | ICD-10-CM | POA: Diagnosis not present

## 2021-04-14 ENCOUNTER — Other Ambulatory Visit: Payer: Self-pay | Admitting: Urology

## 2021-04-21 ENCOUNTER — Emergency Department (HOSPITAL_COMMUNITY)
Admission: EM | Admit: 2021-04-21 | Discharge: 2021-04-21 | Discharge status: Against Medical Advice | Payer: Medicare Other | Attending: Emergency Medicine | Admitting: Emergency Medicine

## 2021-04-21 ENCOUNTER — Other Ambulatory Visit: Payer: Self-pay

## 2021-04-21 NOTE — ED Notes (Signed)
Patient left prior to triage.

## 2021-04-22 ENCOUNTER — Inpatient Hospital Stay: Payer: Medicare Other | Attending: Oncology

## 2021-04-22 DIAGNOSIS — D701 Agranulocytosis secondary to cancer chemotherapy: Secondary | ICD-10-CM | POA: Insufficient documentation

## 2021-04-22 DIAGNOSIS — N323 Diverticulum of bladder: Secondary | ICD-10-CM | POA: Insufficient documentation

## 2021-04-22 DIAGNOSIS — N401 Enlarged prostate with lower urinary tract symptoms: Secondary | ICD-10-CM | POA: Diagnosis not present

## 2021-04-22 DIAGNOSIS — Z79899 Other long term (current) drug therapy: Secondary | ICD-10-CM | POA: Diagnosis not present

## 2021-04-22 DIAGNOSIS — C678 Malignant neoplasm of overlapping sites of bladder: Secondary | ICD-10-CM

## 2021-04-22 DIAGNOSIS — Z886 Allergy status to analgesic agent status: Secondary | ICD-10-CM | POA: Diagnosis not present

## 2021-04-22 DIAGNOSIS — I700 Atherosclerosis of aorta: Secondary | ICD-10-CM | POA: Diagnosis not present

## 2021-04-22 DIAGNOSIS — K8020 Calculus of gallbladder without cholecystitis without obstruction: Secondary | ICD-10-CM | POA: Insufficient documentation

## 2021-04-22 DIAGNOSIS — T451X5A Adverse effect of antineoplastic and immunosuppressive drugs, initial encounter: Secondary | ICD-10-CM | POA: Diagnosis not present

## 2021-04-22 DIAGNOSIS — Z9221 Personal history of antineoplastic chemotherapy: Secondary | ICD-10-CM | POA: Insufficient documentation

## 2021-04-22 DIAGNOSIS — N138 Other obstructive and reflux uropathy: Secondary | ICD-10-CM | POA: Insufficient documentation

## 2021-04-22 DIAGNOSIS — C679 Malignant neoplasm of bladder, unspecified: Secondary | ICD-10-CM | POA: Diagnosis not present

## 2021-04-22 LAB — CMP (CANCER CENTER ONLY)
ALT: 19 U/L (ref 0–44)
AST: 21 U/L (ref 15–41)
Albumin: 3.5 g/dL (ref 3.5–5.0)
Alkaline Phosphatase: 78 U/L (ref 38–126)
Anion gap: 10 (ref 5–15)
BUN: 10 mg/dL (ref 8–23)
CO2: 24 mmol/L (ref 22–32)
Calcium: 9.2 mg/dL (ref 8.9–10.3)
Chloride: 97 mmol/L — ABNORMAL LOW (ref 98–111)
Creatinine: 0.87 mg/dL (ref 0.61–1.24)
GFR, Estimated: >60 mL/min (ref >60)
Glucose, Bld: 127 mg/dL — ABNORMAL HIGH (ref 70–99)
Potassium: 3.9 mmol/L (ref 3.5–5.1)
Sodium: 131 mmol/L — ABNORMAL LOW (ref 135–145)
Total Bilirubin: 0.2 mg/dL — ABNORMAL LOW (ref 0.3–1.2)
Total Protein: 7.1 g/dL (ref 6.5–8.1)

## 2021-04-22 LAB — CBC WITH DIFFERENTIAL (CANCER CENTER ONLY)
Abs Immature Granulocytes: 0.02 10*3/uL (ref 0.00–0.07)
Basophils Absolute: 0 10*3/uL (ref 0.0–0.1)
Basophils Relative: 0 %
Eosinophils Absolute: 0 10*3/uL (ref 0.0–0.5)
Eosinophils Relative: 0 %
HCT: 28.8 % — ABNORMAL LOW (ref 39.0–52.0)
Hemoglobin: 9.8 g/dL — ABNORMAL LOW (ref 13.0–17.0)
Immature Granulocytes: 0 %
Lymphocytes Relative: 22 %
Lymphs Abs: 1.5 10*3/uL (ref 0.7–4.0)
MCH: 31.6 pg (ref 26.0–34.0)
MCHC: 34 g/dL (ref 30.0–36.0)
MCV: 92.9 fL (ref 80.0–100.0)
Monocytes Absolute: 1.2 10*3/uL — ABNORMAL HIGH (ref 0.1–1.0)
Monocytes Relative: 18 %
Neutro Abs: 4.1 10*3/uL (ref 1.7–7.7)
Neutrophils Relative %: 60 %
Platelet Count: 277 10*3/uL (ref 150–400)
RBC: 3.1 MIL/uL — ABNORMAL LOW (ref 4.22–5.81)
RDW: 18.5 % — ABNORMAL HIGH (ref 11.5–15.5)
WBC Count: 6.9 10*3/uL (ref 4.0–10.5)
nRBC: 0 % (ref 0.0–0.2)

## 2021-04-26 DIAGNOSIS — R109 Unspecified abdominal pain: Secondary | ICD-10-CM | POA: Diagnosis not present

## 2021-04-26 DIAGNOSIS — Z936 Other artificial openings of urinary tract status: Secondary | ICD-10-CM | POA: Diagnosis not present

## 2021-04-26 DIAGNOSIS — I809 Phlebitis and thrombophlebitis of unspecified site: Secondary | ICD-10-CM | POA: Diagnosis not present

## 2021-04-26 DIAGNOSIS — C679 Malignant neoplasm of bladder, unspecified: Secondary | ICD-10-CM | POA: Diagnosis not present

## 2021-04-26 DIAGNOSIS — G620 Drug-induced polyneuropathy: Secondary | ICD-10-CM | POA: Diagnosis not present

## 2021-04-26 DIAGNOSIS — M542 Cervicalgia: Secondary | ICD-10-CM | POA: Diagnosis not present

## 2021-04-28 ENCOUNTER — Other Ambulatory Visit: Payer: Self-pay

## 2021-04-28 ENCOUNTER — Ambulatory Visit (HOSPITAL_COMMUNITY)
Admission: RE | Admit: 2021-04-28 | Discharge: 2021-04-28 | Disposition: A | Discharge status: Home / Self Care | Payer: Medicare Other | Source: Ambulatory Visit | Attending: Oncology | Admitting: Oncology

## 2021-04-28 DIAGNOSIS — J9811 Atelectasis: Secondary | ICD-10-CM | POA: Diagnosis not present

## 2021-04-28 DIAGNOSIS — I2510 Atherosclerotic heart disease of native coronary artery without angina pectoris: Secondary | ICD-10-CM | POA: Diagnosis not present

## 2021-04-28 DIAGNOSIS — C678 Malignant neoplasm of overlapping sites of bladder: Secondary | ICD-10-CM | POA: Insufficient documentation

## 2021-04-28 DIAGNOSIS — C679 Malignant neoplasm of bladder, unspecified: Secondary | ICD-10-CM | POA: Diagnosis not present

## 2021-04-28 DIAGNOSIS — N323 Diverticulum of bladder: Secondary | ICD-10-CM | POA: Diagnosis not present

## 2021-04-28 DIAGNOSIS — K8020 Calculus of gallbladder without cholecystitis without obstruction: Secondary | ICD-10-CM | POA: Diagnosis not present

## 2021-04-28 DIAGNOSIS — M19011 Primary osteoarthritis, right shoulder: Secondary | ICD-10-CM | POA: Diagnosis not present

## 2021-04-28 DIAGNOSIS — J984 Other disorders of lung: Secondary | ICD-10-CM | POA: Diagnosis not present

## 2021-04-28 MED ORDER — IOHEXOL 350 MG/ML SOLN
100.0000 mL | Freq: Once | INTRAVENOUS | Status: AC | PRN
  Administered 2021-04-28: 80 mL via INTRAVENOUS

## 2021-04-29 ENCOUNTER — Inpatient Hospital Stay (HOSPITAL_BASED_OUTPATIENT_CLINIC_OR_DEPARTMENT_OTHER): Payer: Medicare Other | Admitting: Oncology

## 2021-04-29 VITALS — BP 141/86 | HR 99 | Temp 97.9°F | Resp 18 | Ht 72.0 in | Wt 185.4 lb

## 2021-04-29 DIAGNOSIS — Z886 Allergy status to analgesic agent status: Secondary | ICD-10-CM | POA: Diagnosis not present

## 2021-04-29 DIAGNOSIS — C678 Malignant neoplasm of overlapping sites of bladder: Secondary | ICD-10-CM

## 2021-04-29 DIAGNOSIS — K8020 Calculus of gallbladder without cholecystitis without obstruction: Secondary | ICD-10-CM | POA: Diagnosis not present

## 2021-04-29 DIAGNOSIS — Z79899 Other long term (current) drug therapy: Secondary | ICD-10-CM | POA: Diagnosis not present

## 2021-04-29 DIAGNOSIS — T451X5A Adverse effect of antineoplastic and immunosuppressive drugs, initial encounter: Secondary | ICD-10-CM | POA: Diagnosis not present

## 2021-04-29 DIAGNOSIS — Z9221 Personal history of antineoplastic chemotherapy: Secondary | ICD-10-CM | POA: Diagnosis not present

## 2021-04-29 DIAGNOSIS — N323 Diverticulum of bladder: Secondary | ICD-10-CM | POA: Diagnosis not present

## 2021-04-29 DIAGNOSIS — D701 Agranulocytosis secondary to cancer chemotherapy: Secondary | ICD-10-CM | POA: Diagnosis not present

## 2021-04-29 DIAGNOSIS — N138 Other obstructive and reflux uropathy: Secondary | ICD-10-CM | POA: Diagnosis not present

## 2021-04-29 DIAGNOSIS — C679 Malignant neoplasm of bladder, unspecified: Secondary | ICD-10-CM | POA: Diagnosis not present

## 2021-04-29 DIAGNOSIS — I700 Atherosclerosis of aorta: Secondary | ICD-10-CM | POA: Diagnosis not present

## 2021-04-29 NOTE — Progress Notes (Signed)
Hematology and Oncology Follow Up Visit  Lance Friedman [id number] 10-08-1945 75 y.o. 04/29/2021 3:45 PM [name], MDGates, [name], MD   Principle Diagnosis: 75 year old man with T2N0 high-grade urothelial carcinoma of the bladder diagnosed in April 2022.     Prior Therapy:  He is status post TURBT on December 21, 2020 with the pathology showed high-grade urothelial carcinoma with muscle invasion.  Neoadjuvant chemotherapy utilizing gemcitabine and cisplatin started on Jan 20, 2021.  He completed 4 cycles of therapy on March 31, 2021.   Current therapy: Under consideration for radical cystectomy.  Interim History: Mr. Puebla presents today for repeat evaluation.  Since the last visit, he reports no major changes in his health.  He has completed chemotherapy without any residual complications.  He denies any nausea, vomiting or abdominal pain.  He denies any worsening neuropathy or fatigue.  He denies any hospitalization or illnesses.       Medications: Updated on review. Current Outpatient Medications  Medication Sig Dispense Refill   acetaminophen (TYLENOL) 500 MG tablet Take 1,000 mg by mouth every 6 (six) hours as needed. (Patient not taking: Reported on 01/27/2021)     co-enzyme Q-10 30 MG capsule Take 30 mg by mouth daily.     FENUGREEK PO Take by mouth. 1-2 tabs per day     Ginseng 100 MG CAPS Take by mouth. 1-2 capsules daily     glucosamine-chondroitin 500-400 MG tablet Take 1 tablet by mouth daily. 3 tabs daily Glucosamine with msm     levothyroxine (SYNTHROID) 112 MCG tablet Take 112 mcg by mouth daily before breakfast.     Multiple Vitamin (MULTIVITAMIN) tablet Take 1 tablet by mouth daily.     NON FORMULARY Achanesia golden seal 2 tabs daily     Omega-3 Fatty Acids (FISH OIL PO) Take 1 capsule by mouth daily. Salmon oil 2 -3 tabs daily, 1 in an 2 in pm 1000 mg each     omeprazole (PRILOSEC) 20 MG capsule Take 20 mg by mouth 2 (two) times daily before a meal.     OVER  THE COUNTER MEDICATION Apply 1 application topically daily as needed (icy hot balm to neck prn and right shoulder prn).     OVER THE COUNTER MEDICATION Calcium magnesium zinc 3 tabs daily     pravastatin (PRAVACHOL) 20 MG tablet Take 20 mg by mouth at bedtime.      prochlorperazine (COMPAZINE) 10 MG tablet Take 1 tablet (10 mg total) by mouth every 6 (six) hours as needed for nausea or vomiting. 30 tablet 0   UNABLE TO FIND Probiotic daily     UNABLE TO FIND Advanced super beta prostate  1 tab bid     VALERIAN ROOT PO Take by mouth at bedtime as needed.     vitamin C (ASCORBIC ACID) 500 MG tablet Take 500 mg by mouth daily. 2 tabs daily     VITAMIN E PO Take 1 tablet by mouth daily. 1000 mg daily     No current facility-administered medications for this visit.     Allergies:  Allergies  Allergen Reactions   Aleve [Naproxen]      Cannot take Naproxen sodium due to past stomach damage       Physical Exam:  Blood pressure (!) 141/86, pulse 99, temperature 97.9 F (36.6 C), temperature source Oral, resp. rate 18, height 6' (1.829 m), weight 185 lb 6.4 oz (84.1 kg), SpO2 100 %.    ECOG: 1    General  appearance: Comfortable appearing without any discomfort Head: Normocephalic without any trauma Oropharynx: Mucous membranes are moist and pink without any thrush or ulcers. Eyes: Pupils are equal and round reactive to light. Lymph nodes: No cervical, supraclavicular, inguinal or axillary lymphadenopathy.   Heart:regular rate and rhythm.  S1 and S2 without leg edema. Lung: Clear without any rhonchi or wheezes.  No dullness to percussion. Abdomin: Soft, nontender, nondistended with good bowel sounds.  No hepatosplenomegaly. Musculoskeletal: No joint deformity or effusion.  Full range of motion noted. Neurological: No deficits noted on motor, sensory and deep tendon reflex exam. Skin: No petechial rash or dryness.  Appeared moist.       Lab Results: Lab Results  Component  Value Date   WBC 6.9 04/22/2021   HGB 9.8 (L) 04/22/2021   HCT 28.8 (L) 04/22/2021   MCV 92.9 04/22/2021   PLT 277 04/22/2021     Chemistry      Component Value Date/Time   NA 131 (L) 04/22/2021 1106   K 3.9 04/22/2021 1106   CL 97 (L) 04/22/2021 1106   CO2 24 04/22/2021 1106   BUN 10 04/22/2021 1106   CREATININE 0.87 04/22/2021 1106      Component Value Date/Time   CALCIUM 9.2 04/22/2021 1106   ALKPHOS 78 04/22/2021 1106   AST 21 04/22/2021 1106   ALT 19 04/22/2021 1106   BILITOT 0.2 (L) 04/22/2021 1106        IMPRESSION: 1. The right-sided enhancing bladder mass seen on the previous study is not well demonstrated today although a right-sided bladder diverticulum persists, smaller than before. 2. No evidence for definite metastatic disease in the chest, abdomen, or pelvis. 3. 12 mm nodule identified deep posterior right costophrenic sulcus. This is in an area of compressive atelectasis and may well be atelectatic. Follow-up CT recommended in 3 months to re-evaluate. 4. Cholelithiasis. 5. Prostatomegaly. 6. Aortic Atherosclerosis (ICD10-I70.0).  Impression and Plan:   75 year old man with:   1.  Bladder cancer diagnosed in April 2022.  He was found to have T2N0 high-grade urothelial carcinoma.    His disease status was updated at this time.  He completed neoadjuvant chemotherapy without any major complications.  CT scan obtained on April 28, 2021 showed no evidence of metastatic disease.  Treatment options moving forward were discussed at this time.  I recommended proceeding with radical cystectomy which she is tentatively scheduled for in September under the care of Dr. Tresa Moore.  Alternative approach such as a bladder sparing radiation therapy could be considered if surgery cannot be completed.  Given his obstructive uropathy, I feel his best option for both cancer control as well as symptom control is to proceed with cystectomy.       2.  Renal function  surveillance: His creatinine is within normal range after platinum therapy.  3.  Neutropenia: Related to chemotherapy and has resolved at this time.   4.  Follow-up: In 3 months for repeat evaluation.     30  minutes were dedicated to this visit.  Time spent on reviewing his imaging studies, treatment choices and future plan of care discussion.        Zola Button, MD 8/26/20223:45 PM

## 2021-05-05 ENCOUNTER — Other Ambulatory Visit (HOSPITAL_COMMUNITY): Payer: Self-pay | Admitting: Interventional Radiology

## 2021-05-05 ENCOUNTER — Other Ambulatory Visit: Payer: Self-pay

## 2021-05-05 ENCOUNTER — Ambulatory Visit (HOSPITAL_COMMUNITY)
Admission: RE | Admit: 2021-05-05 | Discharge: 2021-05-05 | Disposition: A | Discharge status: Home / Self Care | Payer: Medicare Other | Source: Ambulatory Visit | Attending: Interventional Radiology | Admitting: Interventional Radiology

## 2021-05-05 DIAGNOSIS — C679 Malignant neoplasm of bladder, unspecified: Secondary | ICD-10-CM

## 2021-05-05 HISTORY — PX: IR PATIENT EVAL TECH 0-60 MINS: IMG5564

## 2021-05-06 DIAGNOSIS — E871 Hypo-osmolality and hyponatremia: Secondary | ICD-10-CM | POA: Diagnosis not present

## 2021-05-06 DIAGNOSIS — Z87891 Personal history of nicotine dependence: Secondary | ICD-10-CM | POA: Diagnosis not present

## 2021-05-06 DIAGNOSIS — E039 Hypothyroidism, unspecified: Secondary | ICD-10-CM | POA: Diagnosis not present

## 2021-05-06 DIAGNOSIS — T451X5D Adverse effect of antineoplastic and immunosuppressive drugs, subsequent encounter: Secondary | ICD-10-CM | POA: Diagnosis not present

## 2021-05-06 DIAGNOSIS — Z906 Acquired absence of other parts of urinary tract: Secondary | ICD-10-CM | POA: Diagnosis not present

## 2021-05-06 DIAGNOSIS — Z466 Encounter for fitting and adjustment of urinary device: Secondary | ICD-10-CM | POA: Diagnosis not present

## 2021-05-06 DIAGNOSIS — G620 Drug-induced polyneuropathy: Secondary | ICD-10-CM | POA: Diagnosis not present

## 2021-05-06 DIAGNOSIS — C679 Malignant neoplasm of bladder, unspecified: Secondary | ICD-10-CM | POA: Diagnosis not present

## 2021-05-06 DIAGNOSIS — Z436 Encounter for attention to other artificial openings of urinary tract: Secondary | ICD-10-CM | POA: Diagnosis not present

## 2021-05-06 DIAGNOSIS — I809 Phlebitis and thrombophlebitis of unspecified site: Secondary | ICD-10-CM | POA: Diagnosis not present

## 2021-05-06 DIAGNOSIS — M1990 Unspecified osteoarthritis, unspecified site: Secondary | ICD-10-CM | POA: Diagnosis not present

## 2021-05-06 DIAGNOSIS — K219 Gastro-esophageal reflux disease without esophagitis: Secondary | ICD-10-CM | POA: Diagnosis not present

## 2021-05-06 DIAGNOSIS — Z483 Aftercare following surgery for neoplasm: Secondary | ICD-10-CM | POA: Diagnosis not present

## 2021-05-06 DIAGNOSIS — M542 Cervicalgia: Secondary | ICD-10-CM | POA: Diagnosis not present

## 2021-05-10 ENCOUNTER — Encounter (HOSPITAL_COMMUNITY): Payer: Self-pay

## 2021-05-10 DIAGNOSIS — N3000 Acute cystitis without hematuria: Secondary | ICD-10-CM | POA: Diagnosis not present

## 2021-05-10 DIAGNOSIS — R3914 Feeling of incomplete bladder emptying: Secondary | ICD-10-CM | POA: Diagnosis not present

## 2021-05-10 DIAGNOSIS — N130 Hydronephrosis with ureteropelvic junction obstruction: Secondary | ICD-10-CM | POA: Diagnosis not present

## 2021-05-10 DIAGNOSIS — C678 Malignant neoplasm of overlapping sites of bladder: Secondary | ICD-10-CM | POA: Diagnosis not present

## 2021-05-10 NOTE — Procedures (Signed)
Pt came in at this time to have dressing replaced for his drain.

## 2021-05-13 DIAGNOSIS — Z483 Aftercare following surgery for neoplasm: Secondary | ICD-10-CM | POA: Diagnosis not present

## 2021-05-13 DIAGNOSIS — M542 Cervicalgia: Secondary | ICD-10-CM | POA: Diagnosis not present

## 2021-05-13 DIAGNOSIS — K219 Gastro-esophageal reflux disease without esophagitis: Secondary | ICD-10-CM | POA: Diagnosis not present

## 2021-05-13 DIAGNOSIS — I809 Phlebitis and thrombophlebitis of unspecified site: Secondary | ICD-10-CM | POA: Diagnosis not present

## 2021-05-13 DIAGNOSIS — G620 Drug-induced polyneuropathy: Secondary | ICD-10-CM | POA: Diagnosis not present

## 2021-05-13 DIAGNOSIS — E039 Hypothyroidism, unspecified: Secondary | ICD-10-CM | POA: Diagnosis not present

## 2021-05-13 DIAGNOSIS — Z906 Acquired absence of other parts of urinary tract: Secondary | ICD-10-CM | POA: Diagnosis not present

## 2021-05-13 DIAGNOSIS — C679 Malignant neoplasm of bladder, unspecified: Secondary | ICD-10-CM | POA: Diagnosis not present

## 2021-05-13 DIAGNOSIS — T451X5D Adverse effect of antineoplastic and immunosuppressive drugs, subsequent encounter: Secondary | ICD-10-CM | POA: Diagnosis not present

## 2021-05-13 DIAGNOSIS — Z87891 Personal history of nicotine dependence: Secondary | ICD-10-CM | POA: Diagnosis not present

## 2021-05-13 DIAGNOSIS — M1990 Unspecified osteoarthritis, unspecified site: Secondary | ICD-10-CM | POA: Diagnosis not present

## 2021-05-13 DIAGNOSIS — Z466 Encounter for fitting and adjustment of urinary device: Secondary | ICD-10-CM | POA: Diagnosis not present

## 2021-05-13 DIAGNOSIS — E871 Hypo-osmolality and hyponatremia: Secondary | ICD-10-CM | POA: Diagnosis not present

## 2021-05-13 DIAGNOSIS — Z436 Encounter for attention to other artificial openings of urinary tract: Secondary | ICD-10-CM | POA: Diagnosis not present

## 2021-05-16 ENCOUNTER — Other Ambulatory Visit: Payer: Self-pay | Admitting: Urology

## 2021-05-17 ENCOUNTER — Inpatient Hospital Stay (HOSPITAL_COMMUNITY)
Admission: RE | Admit: 2021-05-17 | Discharge: 2021-05-27 | DRG: 654 | Disposition: A | Discharge status: Home Health | Payer: Medicare Other | Attending: Urology | Admitting: Urology

## 2021-05-17 ENCOUNTER — Encounter (HOSPITAL_COMMUNITY): Payer: Self-pay | Admitting: Urology

## 2021-05-17 ENCOUNTER — Other Ambulatory Visit: Payer: Self-pay

## 2021-05-17 DIAGNOSIS — C61 Malignant neoplasm of prostate: Secondary | ICD-10-CM | POA: Diagnosis present

## 2021-05-17 DIAGNOSIS — N020 Recurrent and persistent hematuria with minor glomerular abnormality: Secondary | ICD-10-CM | POA: Diagnosis not present

## 2021-05-17 DIAGNOSIS — Z20822 Contact with and (suspected) exposure to covid-19: Secondary | ICD-10-CM | POA: Diagnosis present

## 2021-05-17 DIAGNOSIS — R55 Syncope and collapse: Secondary | ICD-10-CM | POA: Diagnosis not present

## 2021-05-17 DIAGNOSIS — D6489 Other specified anemias: Secondary | ICD-10-CM | POA: Diagnosis not present

## 2021-05-17 DIAGNOSIS — D630 Anemia in neoplastic disease: Secondary | ICD-10-CM | POA: Diagnosis not present

## 2021-05-17 DIAGNOSIS — K219 Gastro-esophageal reflux disease without esophagitis: Secondary | ICD-10-CM | POA: Diagnosis present

## 2021-05-17 DIAGNOSIS — R319 Hematuria, unspecified: Secondary | ICD-10-CM

## 2021-05-17 DIAGNOSIS — Z79890 Hormone replacement therapy (postmenopausal): Secondary | ICD-10-CM

## 2021-05-17 DIAGNOSIS — C661 Malignant neoplasm of right ureter: Secondary | ICD-10-CM | POA: Diagnosis not present

## 2021-05-17 DIAGNOSIS — R310 Gross hematuria: Secondary | ICD-10-CM | POA: Diagnosis not present

## 2021-05-17 DIAGNOSIS — E7800 Pure hypercholesterolemia, unspecified: Secondary | ICD-10-CM | POA: Diagnosis not present

## 2021-05-17 DIAGNOSIS — G8929 Other chronic pain: Secondary | ICD-10-CM | POA: Diagnosis present

## 2021-05-17 DIAGNOSIS — E878 Other disorders of electrolyte and fluid balance, not elsewhere classified: Secondary | ICD-10-CM | POA: Diagnosis not present

## 2021-05-17 DIAGNOSIS — C678 Malignant neoplasm of overlapping sites of bladder: Secondary | ICD-10-CM | POA: Diagnosis not present

## 2021-05-17 DIAGNOSIS — R531 Weakness: Secondary | ICD-10-CM | POA: Diagnosis not present

## 2021-05-17 DIAGNOSIS — E559 Vitamin D deficiency, unspecified: Secondary | ICD-10-CM | POA: Diagnosis not present

## 2021-05-17 DIAGNOSIS — R5381 Other malaise: Secondary | ICD-10-CM | POA: Diagnosis present

## 2021-05-17 DIAGNOSIS — E860 Dehydration: Secondary | ICD-10-CM | POA: Diagnosis not present

## 2021-05-17 DIAGNOSIS — Z79899 Other long term (current) drug therapy: Secondary | ICD-10-CM | POA: Diagnosis not present

## 2021-05-17 DIAGNOSIS — D649 Anemia, unspecified: Secondary | ICD-10-CM | POA: Diagnosis present

## 2021-05-17 DIAGNOSIS — E039 Hypothyroidism, unspecified: Secondary | ICD-10-CM | POA: Diagnosis present

## 2021-05-17 DIAGNOSIS — E871 Hypo-osmolality and hyponatremia: Secondary | ICD-10-CM | POA: Diagnosis present

## 2021-05-17 DIAGNOSIS — C670 Malignant neoplasm of trigone of bladder: Secondary | ICD-10-CM | POA: Diagnosis not present

## 2021-05-17 DIAGNOSIS — N131 Hydronephrosis with ureteral stricture, not elsewhere classified: Secondary | ICD-10-CM | POA: Diagnosis not present

## 2021-05-17 DIAGNOSIS — Z87891 Personal history of nicotine dependence: Secondary | ICD-10-CM | POA: Diagnosis not present

## 2021-05-17 DIAGNOSIS — M109 Gout, unspecified: Secondary | ICD-10-CM | POA: Diagnosis present

## 2021-05-17 DIAGNOSIS — K560 Paralytic ileus: Secondary | ICD-10-CM | POA: Diagnosis present

## 2021-05-17 DIAGNOSIS — K2100 Gastro-esophageal reflux disease with esophagitis, without bleeding: Secondary | ICD-10-CM | POA: Diagnosis not present

## 2021-05-17 DIAGNOSIS — M1990 Unspecified osteoarthritis, unspecified site: Secondary | ICD-10-CM | POA: Diagnosis not present

## 2021-05-17 DIAGNOSIS — E032 Hypothyroidism due to medicaments and other exogenous substances: Secondary | ICD-10-CM | POA: Diagnosis not present

## 2021-05-17 DIAGNOSIS — N2889 Other specified disorders of kidney and ureter: Secondary | ICD-10-CM | POA: Diagnosis not present

## 2021-05-17 DIAGNOSIS — N1330 Unspecified hydronephrosis: Secondary | ICD-10-CM | POA: Diagnosis not present

## 2021-05-17 DIAGNOSIS — R7303 Prediabetes: Secondary | ICD-10-CM | POA: Diagnosis present

## 2021-05-17 DIAGNOSIS — E785 Hyperlipidemia, unspecified: Secondary | ICD-10-CM | POA: Diagnosis not present

## 2021-05-17 DIAGNOSIS — C679 Malignant neoplasm of bladder, unspecified: Principal | ICD-10-CM | POA: Diagnosis present

## 2021-05-17 LAB — CBC
HCT: 31.5 % — ABNORMAL LOW (ref 39.0–52.0)
Hemoglobin: 10.8 g/dL — ABNORMAL LOW (ref 13.0–17.0)
MCH: 32 pg (ref 26.0–34.0)
MCHC: 34.3 g/dL (ref 30.0–36.0)
MCV: 93.5 fL (ref 80.0–100.0)
Platelets: 240 10*3/uL (ref 150–400)
RBC: 3.37 MIL/uL — ABNORMAL LOW (ref 4.22–5.81)
RDW: 15.4 % (ref 11.5–15.5)
WBC: 6.1 10*3/uL (ref 4.0–10.5)
nRBC: 0 % (ref 0.0–0.2)

## 2021-05-17 LAB — COMPREHENSIVE METABOLIC PANEL
ALT: 20 U/L (ref 0–44)
AST: 21 U/L (ref 15–41)
Albumin: 4.3 g/dL (ref 3.5–5.0)
Alkaline Phosphatase: 54 U/L (ref 38–126)
Anion gap: 8 (ref 5–15)
BUN: 10 mg/dL (ref 8–23)
CO2: 28 mmol/L (ref 22–32)
Calcium: 9.8 mg/dL (ref 8.9–10.3)
Chloride: 100 mmol/L (ref 98–111)
Creatinine, Ser: 0.68 mg/dL (ref 0.61–1.24)
GFR, Estimated: >60 mL/min (ref >60)
Glucose, Bld: 93 mg/dL (ref 70–99)
Potassium: 4 mmol/L (ref 3.5–5.1)
Sodium: 136 mmol/L (ref 135–145)
Total Bilirubin: 0.5 mg/dL (ref 0.3–1.2)
Total Protein: 7.8 g/dL (ref 6.5–8.1)

## 2021-05-17 LAB — ABO/RH: ABO/RH(D): O POS

## 2021-05-17 LAB — SURGICAL PCR SCREEN
MRSA, PCR: NEGATIVE
Staphylococcus aureus: POSITIVE — AB

## 2021-05-17 LAB — PREPARE RBC (CROSSMATCH)

## 2021-05-17 MED ORDER — NEOMYCIN SULFATE 500 MG PO TABS
500.0000 mg | ORAL_TABLET | ORAL | Status: AC
  Administered 2021-05-17 (×3): 500 mg via ORAL
  Filled 2021-05-17 (×3): qty 1

## 2021-05-17 MED ORDER — PEG 3350-KCL-NA BICARB-NACL 420 G PO SOLR
4000.0000 mL | Freq: Once | ORAL | Status: AC
  Administered 2021-05-17: 4000 mL via ORAL

## 2021-05-17 MED ORDER — METRONIDAZOLE 500 MG PO TABS
500.0000 mg | ORAL_TABLET | ORAL | Status: AC
  Administered 2021-05-17 (×3): 500 mg via ORAL
  Filled 2021-05-17 (×3): qty 1

## 2021-05-17 MED ORDER — PIPERACILLIN-TAZOBACTAM 3.375 G IVPB 30 MIN: 3.3750 g | INTRAVENOUS | Status: DC

## 2021-05-17 MED ORDER — ALVIMOPAN 12 MG PO CAPS
12.0000 mg | ORAL_CAPSULE | ORAL | Status: AC
  Administered 2021-05-18: 12 mg via ORAL
  Filled 2021-05-17: qty 1

## 2021-05-17 MED ORDER — PIPERACILLIN-TAZOBACTAM 3.375 G IVPB
3.3750 g | INTRAVENOUS | Status: AC
  Administered 2021-05-18: 3.375 g via INTRAVENOUS
  Filled 2021-05-17: qty 50

## 2021-05-17 MED ORDER — FAMOTIDINE 20 MG PO TABS
20.0000 mg | ORAL_TABLET | Freq: Every day | ORAL | Status: DC | PRN
  Administered 2021-05-19 – 2021-05-27 (×8): 20 mg via ORAL
  Filled 2021-05-17 (×8): qty 1

## 2021-05-17 MED ORDER — PANTOPRAZOLE SODIUM 40 MG PO TBEC
40.0000 mg | DELAYED_RELEASE_TABLET | Freq: Every day | ORAL | Status: DC
  Administered 2021-05-19 – 2021-05-23 (×5): 40 mg via ORAL
  Filled 2021-05-17 (×5): qty 1

## 2021-05-17 MED ORDER — CHLORHEXIDINE GLUCONATE CLOTH 2 % EX PADS
6.0000 | MEDICATED_PAD | Freq: Every day | CUTANEOUS | Status: DC
  Administered 2021-05-18: 6 via TOPICAL

## 2021-05-17 MED ORDER — PRAVASTATIN SODIUM 20 MG PO TABS
20.0000 mg | ORAL_TABLET | Freq: Every day | ORAL | Status: DC
  Administered 2021-05-17 – 2021-05-26 (×10): 20 mg via ORAL
  Filled 2021-05-17 (×10): qty 1

## 2021-05-17 MED ORDER — SODIUM CHLORIDE 0.9 % IV SOLN: INTRAVENOUS | Status: DC

## 2021-05-17 MED ORDER — PRAVASTATIN SODIUM 20 MG PO TABS: 20.0000 mg | ORAL_TABLET | Freq: Every day | ORAL | Status: DC

## 2021-05-17 MED ORDER — LEVOTHYROXINE SODIUM 112 MCG PO TABS
112.0000 ug | ORAL_TABLET | Freq: Every day | ORAL | Status: DC
  Administered 2021-05-18 – 2021-05-27 (×10): 112 ug via ORAL
  Filled 2021-05-17 (×10): qty 1

## 2021-05-17 MED ORDER — MUPIROCIN 2 % EX OINT
1.0000 "application " | TOPICAL_OINTMENT | Freq: Two times a day (BID) | CUTANEOUS | Status: AC
  Administered 2021-05-17 – 2021-05-22 (×9): 1 via NASAL
  Filled 2021-05-17 (×2): qty 22

## 2021-05-17 NOTE — H&P (Signed)
Lance Friedman is an 75 y.o. male.    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 No locally advanced / distant disease. Single ureters bilaterally. On curative intent path with neoadjuvant gem-cis per Dr. Alen Blew (started 01/2021) and then radical cystectomy scheduled 05/18/21. Restaging imaging mid 04/2021 favorable w/o overt metastatic disease and some likely chemo downstaging. Cr <1, Hgb 9.8, WBC OK.   2 - R>L Malignant Hydronephrosis - Cr 1.3's with R>L malig hydro. Rt neph tube placed 12/2020, exchanged 03/2021.   3 - Urinary Retention - foley placed 01/04/21 for new retention, now much improved local symptoms.   PMH sig for hypothroid, wrist surgery, mild obesity. No ischemic CV disease / blood thinners. No chest/abd surgery. 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 His PCP is Marcellus Scott MD.   Today " Gene " is seen as pre-op admission for stomal marking, bowel prep, labs prior to major cancer surgery tomorrow.   Past Medical History:  Diagnosis Date   Arthritis    oa   Bladder cancer ([place])    Chronic neck pain    Gastritis    GERD (gastroesophageal reflux disease)    Gout    last flare up fall 2021   Headache    Hematuria    started again 12-13-2020   Hyperlipemia    Hypothyroidism    Pre-diabetes    Vitamin D deficiency    Wears dentures    full set   Wears glasses     Past Surgical History:  Procedure Laterality Date   COLONOSCOPY WITH PROPOFOL N/A 03/21/2016   Procedure: COLONOSCOPY WITH PROPOFOL;  Surgeon: Garlan Fair, MD;  Location: [facility];  Service: Endoscopy;  Laterality: N/A;   CYSTOSCOPY W/ RETROGRADES Bilateral 12/21/2020   Procedure: CYSTOSCOPY WITH RETROGRADE PYELOGRAM;  Surgeon: [name], MD;  Location: [facility];  Service:  Urology;  Laterality: Bilateral;   ingrown toenail surgery  yrs ago   IR NEPHROSTOMY EXCHANGE RIGHT  03/04/2021   IR NEPHROSTOMY PLACEMENT RIGHT  12/28/2020   IR PATIENT EVAL TECH 0-60 MINS  03/08/2021   IR PATIENT EVAL TECH 0-60 MINS  05/05/2021   MOUTH SURGERY  2015   tooth extraction   TONSILLECTOMY  as child   TRANSURETHRAL RESECTION OF BLADDER TUMOR N/A 12/21/2020   Procedure: TRANSURETHRAL RESECTION OF BLADDER TUMOR (TURBT);  Surgeon: [name], MD;  Location: [facility];  Service: Urology;  Laterality: N/A;  77 MINS    History reviewed. No pertinent family history. Social History:  reports that he quit smoking about 37 years ago. His smoking use included cigarettes. He has never used smokeless tobacco. He reports current drug use. Drug: Marijuana. He reports that he does not drink alcohol.  Allergies:  Allergies  Allergen Reactions   Aleve [Naproxen] Other (See Comments)     Cannot take Naproxen sodium due to past stomach damage    Aspirin Other (See Comments)    History of stomach bleeds   Motrin [Ibuprofen] Other (See Comments)    History of stomach bleeds   Shellfish-Derived Products Swelling and Other (See Comments)    Caused joint swelling    Medications Prior to Admission  Medication Sig Dispense Refill   acetaminophen (TYLENOL) 500 MG tablet Take 1,000 mg by mouth  in the morning and at bedtime.     famotidine (PEPCID) 20 MG tablet Take 20 mg by mouth daily as needed for heartburn.     levothyroxine (SYNTHROID) 112 MCG tablet Take 112 mcg by mouth daily before breakfast.     neomycin-bacitracin-polymyxin (NEOSPORIN) ointment Apply 1 application topically 2 (two) times daily as needed for wound care ((affected sites)).     omeprazole (PRILOSEC) 20 MG capsule Take 40 mg by mouth every morning.     pravastatin (PRAVACHOL) 20 MG tablet Take 20 mg by mouth at bedtime.      Coenzyme Q10 100 MG capsule Take 100 mg by mouth daily. (Patient not taking:  Reported on 05/17/2021)     diclofenac Sodium (VOLTAREN) 1 % GEL Apply 1 application topically daily as needed (pain). (Patient not taking: Reported on 05/17/2021)     GINSENG PO Take 1-2 capsules by mouth daily. 400 mg per cap, American Ginseng (Patient not taking: Reported on 05/17/2021)     [place] Ginseng 100 MG CAPS Take 100 mg by mouth daily. (Patient not taking: Reported on 05/17/2021)     Multiple Vitamin (MULTIVITAMIN) tablet Take 1 tablet by mouth daily. (Patient not taking: Reported on 05/17/2021)     NON FORMULARY Take 2 tablets by mouth See admin instructions. Achanesia Armandina Gemma Seal tablets- Take 2 tablets by mouth once a day (Patient not taking: Reported on 05/17/2021)     OVER THE COUNTER MEDICATION Take 3 tablets by mouth daily. Calcium magnesium zinc 3 tabs daily (Patient not taking: Reported on 05/17/2021)     prochlorperazine (COMPAZINE) 10 MG tablet Take 1 tablet (10 mg total) by mouth every 6 (six) hours as needed for nausea or vomiting. (Patient not taking: Reported on 05/17/2021) 30 tablet 0   vitamin C (ASCORBIC ACID) 500 MG tablet Take 1,000 mg by mouth daily. Chewables (Patient not taking: Reported on 05/17/2021)     Vitamin E 670 MG (1000 UT) CAPS Take 1,000 Units by mouth daily. 1000 mg daily (Patient not taking: Reported on 05/17/2021)      Results for orders placed or performed during the hospital encounter of 05/17/21 (from the past 48 hour(s))  CBC     Status: Abnormal   Collection Time: 05/17/21  2:16 PM  Result Value Ref Range   WBC 6.1 4.0 - 10.5 K/uL   RBC 3.37 (L) 4.22 - 5.81 MIL/uL   Hemoglobin 10.8 (L) 13.0 - 17.0 g/dL   HCT 31.5 (L) 39.0 - 52.0 %   MCV 93.5 80.0 - 100.0 fL   MCH 32.0 26.0 - 34.0 pg   MCHC 34.3 30.0 - 36.0 g/dL   RDW 15.4 11.5 - 15.5 %   Platelets 240 150 - 400 K/uL   nRBC 0.0 0.0 - 0.2 %    Comment: Performed at [facility], [place] [address], [place], [place] [address]  Comprehensive metabolic panel     Status: None    Collection Time: 05/17/21  2:16 PM  Result Value Ref Range   Sodium 136 135 - 145 mmol/L   Potassium 4.0 3.5 - 5.1 mmol/L   Chloride 100 98 - 111 mmol/L   CO2 28 22 - 32 mmol/L   Glucose, Bld 93 70 - 99 mg/dL    Comment: Glucose reference range applies only to samples taken after fasting for at least 8 hours.   BUN 10 8 - 23 mg/dL   Creatinine, Ser 0.68 0.61 - 1.24 mg/dL   Calcium 9.8 8.9 -  10.3 mg/dL   Total Protein 7.8 6.5 - 8.1 g/dL   Albumin 4.3 3.5 - 5.0 g/dL   AST 21 15 - 41 U/L   ALT 20 0 - 44 U/L   Alkaline Phosphatase 54 38 - 126 U/L   Total Bilirubin 0.5 0.3 - 1.2 mg/dL   GFR, Estimated >60 >60 mL/min    Comment: (NOTE) Calculated using the CKD-EPI Creatinine Equation (2021)    Anion gap 8 5 - 15    Comment: Performed at [facility], [place] [address], [place], [place] [address]  Type and screen [place]     Status: None   Collection Time: 05/17/21  2:16 PM  Result Value Ref Range   ABO/RH(D) O POS    Antibody Screen NEG    Sample Expiration      05/20/2021,2359 Performed at [facility], [place] [address] [place], [place] [address]    No results found.  Review of Systems  Constitutional:  Negative for chills and fever.  All other systems reviewed and are negative.  Blood pressure (!) 149/96, pulse 96, temperature 98.2 F (36.8 C), temperature source Oral, resp. rate 18, height 6' (1.829 m), weight 83.1 kg, SpO2 99 %. Physical Exam Vitals reviewed.  HENT:     Head: Normocephalic.     Nose: Nose normal.  Eyes:     Pupils: Pupils are equal, round, and reactive to light.  Cardiovascular:     Rate and Rhythm: Normal rate.  Abdominal:     General: Abdomen is flat.     Comments: Stomal marking site noted  Genitourinary:    Comments: Rt neph tueb with non-foul urine. Foley with non-foul urine.  Musculoskeletal:        General: Normal range of motion.     Cervical back: Normal range of motion.  Skin:     General: Skin is warm.  Neurological:     General: No focal deficit present.     Mental Status: He is alert.  Psychiatric:        Mood and Affect: Mood normal.     Assessment/Plan  BMP, CBC, T+C 2 units, stomal marking, bowel prep in preparation for surgery tomorrow. Risks (including non-cure and mortality), benefits, alternatives, expected peri-op course discussed in detail.   [name], MD 05/17/2021, 4:52 PM

## 2021-05-17 NOTE — Progress Notes (Signed)
Pt arrived to unit as a direct admit. He ambulated onto unit, to room with steady, independent gait. VSS. Current orders reviewed w/ pt. Pt oriented to callbell and environment. PA Dancy made aware of pt's arrival and admitting orders reviewed.

## 2021-05-17 NOTE — Consult Note (Signed)
[facility] Nurse ostomy consult note  [facility] Nurse requested for preoperative stoma site marking by Dr. Tresa Moore.  Discussed surgical procedure and stoma creation with patient.  Explained role of the [facility] nurse team.  Answered patient questions.   Examined patient sitting and standing in order to place the marking in the patient's visual field, away from any creases or abdominal contour issues and within the rectus muscle.    Marked for ileal conduit in the RLQ 5.5cm to the right of the umbilicus and  [id number] above the umbilicus.  Patient's abdomen cleansed with CHG wipes at site marking, allowed to air dry prior to marking. Covered mark with thin film transparent dressing to preserve mark until date of surgery (tomorrow, 05/18/21).   Thank you for the opportunity to meet and mark this nice gentleman prior to surgery.  [facility] Nurse team will follow up with patient after surgery for continue ostomy care and teaching.   Maudie Flakes, MSN, RN, [place], Arther Abbott  Pager# [phone]

## 2021-05-18 ENCOUNTER — Inpatient Hospital Stay (HOSPITAL_COMMUNITY): Payer: Medicare Other | Admitting: Certified Registered Nurse Anesthetist

## 2021-05-18 ENCOUNTER — Encounter (HOSPITAL_COMMUNITY)
Admission: RE | Disposition: A | Discharge status: Home Health | Payer: Self-pay | Source: Home / Self Care | Attending: Urology

## 2021-05-18 DIAGNOSIS — E039 Hypothyroidism, unspecified: Secondary | ICD-10-CM | POA: Diagnosis not present

## 2021-05-18 DIAGNOSIS — E785 Hyperlipidemia, unspecified: Secondary | ICD-10-CM | POA: Diagnosis not present

## 2021-05-18 DIAGNOSIS — M1990 Unspecified osteoarthritis, unspecified site: Secondary | ICD-10-CM | POA: Diagnosis not present

## 2021-05-18 DIAGNOSIS — K219 Gastro-esophageal reflux disease without esophagitis: Secondary | ICD-10-CM | POA: Diagnosis not present

## 2021-05-18 HISTORY — PX: LYMPH NODE DISSECTION: SHX5087

## 2021-05-18 HISTORY — PX: ROBOT ASSISTED LAPAROSCOPIC COMPLETE CYSTECT ILEAL CONDUIT: SHX5139

## 2021-05-18 HISTORY — PX: CYSTOSCOPY WITH INJECTION: SHX1424

## 2021-05-18 LAB — HEMOGLOBIN AND HEMATOCRIT, BLOOD
HCT: 28.5 % — ABNORMAL LOW (ref 39.0–52.0)
Hemoglobin: 9.8 g/dL — ABNORMAL LOW (ref 13.0–17.0)

## 2021-05-18 LAB — SARS CORONAVIRUS 2 (TAT 6-24 HRS): SARS Coronavirus 2: NEGATIVE

## 2021-05-18 SURGERY — CYSTECTOMY, ROBOT-ASSISTED, WITH ILEAL CONDUIT CREATION
Anesthesia: General

## 2021-05-18 MED ORDER — ORAL CARE MOUTH RINSE: 15.0000 mL | Freq: Once | OROMUCOSAL | Status: AC

## 2021-05-18 MED ORDER — PROPOFOL 10 MG/ML IV BOLUS
INTRAVENOUS | Status: AC
  Filled 2021-05-18: qty 40

## 2021-05-18 MED ORDER — BUPIVACAINE LIPOSOME 1.3 % IJ SUSP
INTRAMUSCULAR | Status: DC | PRN
  Administered 2021-05-18: 20 mL

## 2021-05-18 MED ORDER — DEXAMETHASONE SODIUM PHOSPHATE 10 MG/ML IJ SOLN
INTRAMUSCULAR | Status: DC | PRN
  Administered 2021-05-18: 10 mg via INTRAVENOUS

## 2021-05-18 MED ORDER — ONDANSETRON HCL 4 MG/2ML IJ SOLN: 4.0000 mg | INTRAMUSCULAR | Status: DC | PRN

## 2021-05-18 MED ORDER — WATER FOR IRRIGATION, STERILE IR SOLN
Status: DC | PRN
  Administered 2021-05-18: 1000 mL

## 2021-05-18 MED ORDER — AMISULPRIDE (ANTIEMETIC) 5 MG/2ML IV SOLN: 10.0000 mg | Freq: Once | INTRAVENOUS | Status: DC | PRN

## 2021-05-18 MED ORDER — ROCURONIUM BROMIDE 10 MG/ML (PF) SYRINGE
PREFILLED_SYRINGE | INTRAVENOUS | Status: AC
  Filled 2021-05-18: qty 10

## 2021-05-18 MED ORDER — HYDROMORPHONE HCL 1 MG/ML IJ SOLN
0.5000 mg | INTRAMUSCULAR | Status: DC | PRN
  Administered 2021-05-18 – 2021-05-19 (×4): 1 mg via INTRAVENOUS
  Filled 2021-05-18 (×4): qty 1

## 2021-05-18 MED ORDER — CHLORHEXIDINE GLUCONATE 0.12 % MT SOLN
15.0000 mL | Freq: Once | OROMUCOSAL | Status: AC
  Administered 2021-05-18: 15 mL via OROMUCOSAL

## 2021-05-18 MED ORDER — DEXAMETHASONE SODIUM PHOSPHATE 10 MG/ML IJ SOLN
INTRAMUSCULAR | Status: AC
  Filled 2021-05-18: qty 1

## 2021-05-18 MED ORDER — ALVIMOPAN 12 MG PO CAPS
12.0000 mg | ORAL_CAPSULE | Freq: Two times a day (BID) | ORAL | Status: DC
  Administered 2021-05-19 – 2021-05-24 (×11): 12 mg via ORAL
  Filled 2021-05-18 (×11): qty 1

## 2021-05-18 MED ORDER — PHENYLEPHRINE HCL-NACL 20-0.9 MG/250ML-% IV SOLN
INTRAVENOUS | Status: DC | PRN
  Administered 2021-05-18: 20 ug/min via INTRAVENOUS

## 2021-05-18 MED ORDER — PROPOFOL 10 MG/ML IV BOLUS
INTRAVENOUS | Status: DC | PRN
  Administered 2021-05-18: 150 mg via INTRAVENOUS

## 2021-05-18 MED ORDER — ACETAMINOPHEN 10 MG/ML IV SOLN
1000.0000 mg | Freq: Four times a day (QID) | INTRAVENOUS | Status: AC
  Administered 2021-05-18 – 2021-05-19 (×4): 1000 mg via INTRAVENOUS
  Filled 2021-05-18 (×4): qty 100

## 2021-05-18 MED ORDER — SENNOSIDES-DOCUSATE SODIUM 8.6-50 MG PO TABS
2.0000 | ORAL_TABLET | Freq: Every day | ORAL | Status: DC
Start: 2021-05-18 — End: 2021-05-27
  Administered 2021-05-18 – 2021-05-26 (×9): 2 via ORAL
  Filled 2021-05-18 (×9): qty 2

## 2021-05-18 MED ORDER — FENTANYL CITRATE (PF) 100 MCG/2ML IJ SOLN
INTRAMUSCULAR | Status: AC
  Filled 2021-05-18: qty 2

## 2021-05-18 MED ORDER — BUPIVACAINE LIPOSOME 1.3 % IJ SUSP
INTRAMUSCULAR | Status: AC
  Filled 2021-05-18: qty 10

## 2021-05-18 MED ORDER — SUGAMMADEX SODIUM 200 MG/2ML IV SOLN
INTRAVENOUS | Status: DC | PRN
Start: 2021-05-18 — End: 2021-05-18
  Administered 2021-05-18: 200 mg via INTRAVENOUS

## 2021-05-18 MED ORDER — DIPHENHYDRAMINE HCL 12.5 MG/5ML PO ELIX: 12.5000 mg | ORAL_SOLUTION | Freq: Four times a day (QID) | ORAL | Status: DC | PRN

## 2021-05-18 MED ORDER — LIDOCAINE 2% (20 MG/ML) 5 ML SYRINGE
INTRAMUSCULAR | Status: DC | PRN
Start: 2021-05-18 — End: 2021-05-18
  Administered 2021-05-18: 80 mg via INTRAVENOUS

## 2021-05-18 MED ORDER — SODIUM CHLORIDE (PF) 0.9 % IJ SOLN
INTRAMUSCULAR | Status: DC | PRN
  Administered 2021-05-18: 20 mL

## 2021-05-18 MED ORDER — FENTANYL CITRATE (PF) 100 MCG/2ML IJ SOLN
INTRAMUSCULAR | Status: DC | PRN
  Administered 2021-05-18 (×7): 50 ug via INTRAVENOUS

## 2021-05-18 MED ORDER — ROCURONIUM BROMIDE 10 MG/ML (PF) SYRINGE
PREFILLED_SYRINGE | INTRAVENOUS | Status: DC | PRN
  Administered 2021-05-18: 30 mg via INTRAVENOUS
  Administered 2021-05-18: 40 mg via INTRAVENOUS
  Administered 2021-05-18: 60 mg via INTRAVENOUS
  Administered 2021-05-18: 20 mg via INTRAVENOUS

## 2021-05-18 MED ORDER — STERILE WATER FOR INJECTION IJ SOLN
INTRAMUSCULAR | Status: DC | PRN
  Administered 2021-05-18: 10 mL

## 2021-05-18 MED ORDER — ACETAMINOPHEN 500 MG PO TABS
1000.0000 mg | ORAL_TABLET | Freq: Once | ORAL | Status: AC
  Administered 2021-05-18: 1000 mg via ORAL
  Filled 2021-05-18: qty 2

## 2021-05-18 MED ORDER — NAPHAZOLINE-GLYCERIN 0.012-0.25 % OP SOLN
1.0000 [drp] | Freq: Four times a day (QID) | OPHTHALMIC | Status: DC | PRN
  Administered 2021-05-19: 1 [drp] via OPHTHALMIC
  Filled 2021-05-18: qty 15

## 2021-05-18 MED ORDER — ONDANSETRON HCL 4 MG/2ML IJ SOLN
INTRAMUSCULAR | Status: AC
  Filled 2021-05-18: qty 2

## 2021-05-18 MED ORDER — DEXTROSE-NACL 5-0.45 % IV SOLN
INTRAVENOUS | Status: DC
Start: 2021-05-18 — End: 2021-05-20

## 2021-05-18 MED ORDER — FENTANYL CITRATE (PF) 250 MCG/5ML IJ SOLN
INTRAMUSCULAR | Status: AC
  Filled 2021-05-18: qty 5

## 2021-05-18 MED ORDER — FENTANYL CITRATE PF 50 MCG/ML IJ SOSY
25.0000 ug | PREFILLED_SYRINGE | INTRAMUSCULAR | Status: DC | PRN
  Administered 2021-05-18: 50 ug via INTRAVENOUS

## 2021-05-18 MED ORDER — LACTATED RINGERS IV SOLN: INTRAVENOUS | Status: DC

## 2021-05-18 MED ORDER — SODIUM CHLORIDE (PF) 0.9 % IJ SOLN
INTRAMUSCULAR | Status: AC
  Filled 2021-05-18: qty 20

## 2021-05-18 MED ORDER — OXYCODONE HCL 5 MG PO TABS
5.0000 mg | ORAL_TABLET | ORAL | Status: DC | PRN
  Administered 2021-05-18 – 2021-05-23 (×21): 5 mg via ORAL
  Filled 2021-05-18 (×21): qty 1

## 2021-05-18 MED ORDER — PHENYLEPHRINE HCL (PRESSORS) 10 MG/ML IV SOLN
INTRAVENOUS | Status: AC
  Filled 2021-05-18: qty 2

## 2021-05-18 MED ORDER — ONDANSETRON HCL 4 MG/2ML IJ SOLN
INTRAMUSCULAR | Status: DC | PRN
  Administered 2021-05-18 (×2): 4 mg via INTRAVENOUS

## 2021-05-18 MED ORDER — LIDOCAINE 2% (20 MG/ML) 5 ML SYRINGE
INTRAMUSCULAR | Status: AC
  Filled 2021-05-18: qty 5

## 2021-05-18 MED ORDER — FENTANYL CITRATE PF 50 MCG/ML IJ SOSY
PREFILLED_SYRINGE | INTRAMUSCULAR | Status: AC
  Filled 2021-05-18: qty 1

## 2021-05-18 MED ORDER — LACTATED RINGERS IR SOLN
Status: DC | PRN
  Administered 2021-05-18: 1000 mL

## 2021-05-18 MED ORDER — DIPHENHYDRAMINE HCL 50 MG/ML IJ SOLN: 12.5000 mg | Freq: Four times a day (QID) | INTRAMUSCULAR | Status: DC | PRN

## 2021-05-18 SURGICAL SUPPLY — 118 items
APPLICATOR COTTON TIP 6 STRL (MISCELLANEOUS) ×2 IMPLANT
APPLICATOR COTTON TIP 6IN STRL (MISCELLANEOUS) ×3
APPLICATOR SURGIFLO ENDO (HEMOSTASIS) IMPLANT
BAG COUNTER SPONGE SURGICOUNT (BAG) IMPLANT
BAG LAPAROSCOPIC 12 15 PORT 16 (BASKET) ×2 IMPLANT
BAG RETRIEVAL 12/15 (BASKET) ×3
BAG URO CATCHER STRL LF (MISCELLANEOUS) ×3 IMPLANT
BENZOIN TINCTURE PRP APPL 2/3 (GAUZE/BANDAGES/DRESSINGS) ×3 IMPLANT
BLADE SURG SZ10 CARB STEEL (BLADE) IMPLANT
CATH FOLEY 2WAY SLVR 18FR 30CC (CATHETERS) ×3 IMPLANT
CATH SILICONE 5CC 18FR (INSTRUMENTS) ×3 IMPLANT
CATH TIEMANN FOLEY 18FR 5CC (CATHETERS) ×3 IMPLANT
CELLS DAT CNTRL 66122 CELL SVR (MISCELLANEOUS) ×2 IMPLANT
CHLORAPREP W/TINT 26 (MISCELLANEOUS) ×3 IMPLANT
CLIP LIGATING HEMO LOK XL GOLD (MISCELLANEOUS) ×6 IMPLANT
CLIP LIGATING HEMO O LOK GREEN (MISCELLANEOUS) ×9 IMPLANT
CLIP VESOLOCK LG 6/CT PURPLE (CLIP) ×15 IMPLANT
CLOTH BEACON ORANGE TIMEOUT ST (SAFETY) ×3 IMPLANT
CNTNR URN SCR LID CUP LEK RST (MISCELLANEOUS) ×2 IMPLANT
CONT SPEC 4OZ STRL OR WHT (MISCELLANEOUS) ×3
COVER SURGICAL LIGHT HANDLE (MISCELLANEOUS) ×3 IMPLANT
COVER TIP SHEARS 8 DVNC (MISCELLANEOUS) ×2 IMPLANT
COVER TIP SHEARS 8MM DA VINCI (MISCELLANEOUS) ×3
CUTTER ECHEON FLEX ENDO 45 340 (ENDOMECHANICALS) IMPLANT
DECANTER SPIKE VIAL GLASS SM (MISCELLANEOUS) ×3 IMPLANT
DERMABOND ADVANCED (GAUZE/BANDAGES/DRESSINGS) ×1
DERMABOND ADVANCED .7 DNX12 (GAUZE/BANDAGES/DRESSINGS) ×2 IMPLANT
DRAIN CHANNEL RND F F (WOUND CARE) IMPLANT
DRAIN PENROSE 0.5X18 (DRAIN) IMPLANT
DRAPE ARM DVNC X/XI (DISPOSABLE) ×8 IMPLANT
DRAPE COLUMN DVNC XI (DISPOSABLE) ×2 IMPLANT
DRAPE DA VINCI XI ARM (DISPOSABLE) ×12
DRAPE DA VINCI XI COLUMN (DISPOSABLE) ×3
DRAPE SURG IRRIG POUCH 19X23 (DRAPES) ×3 IMPLANT
DRSG TEGADERM 4X4.75 (GAUZE/BANDAGES/DRESSINGS) ×3 IMPLANT
DRSG TEGADERM 6X8 (GAUZE/BANDAGES/DRESSINGS) ×3 IMPLANT
ELECT PENCIL ROCKER SW 15FT (MISCELLANEOUS) ×3 IMPLANT
ELECT REM PT RETURN 15FT ADLT (MISCELLANEOUS) ×3 IMPLANT
GAUZE 4X4 16PLY ~~LOC~~+RFID DBL (SPONGE) IMPLANT
GAUZE SPONGE 2X2 8PLY STRL LF (GAUZE/BANDAGES/DRESSINGS) IMPLANT
GAUZE SPONGE 4X4 12PLY STRL (GAUZE/BANDAGES/DRESSINGS) ×3 IMPLANT
GLOVE SURG ENC MOIS LTX SZ6.5 (GLOVE) ×6 IMPLANT
GLOVE SURG ENC TEXT LTX SZ7.5 (GLOVE) ×9 IMPLANT
GLOVE SURG UNDER POLY LF SZ7.5 (GLOVE) ×3 IMPLANT
GOWN STRL REUS W/TWL LRG LVL3 (GOWN DISPOSABLE) ×15 IMPLANT
HOLDER FOLEY CATH W/STRAP (MISCELLANEOUS) ×3 IMPLANT
IRRIG SUCT STRYKERFLOW 2 WTIP (MISCELLANEOUS) ×3
IRRIGATION SUCT STRKRFLW 2 WTP (MISCELLANEOUS) ×2 IMPLANT
IV LACTATED RINGERS 1000ML (IV SOLUTION) ×3 IMPLANT
KIT PROCEDURE DA VINCI SI (MISCELLANEOUS) ×3
KIT PROCEDURE DVNC SI (MISCELLANEOUS) ×2 IMPLANT
KIT TURNOVER KIT A (KITS) ×3 IMPLANT
LOOP VESSEL MAXI BLUE (MISCELLANEOUS) ×3 IMPLANT
MANIFOLD NEPTUNE II (INSTRUMENTS) ×3 IMPLANT
NEEDLE ASPIRATION 22 (NEEDLE) ×3 IMPLANT
NEEDLE INSUFFLATION 14GA 120MM (NEEDLE) ×3 IMPLANT
NEEDLE SPNL 22GX7 QUINCKE BK (NEEDLE) ×3 IMPLANT
PACK CYSTO (CUSTOM PROCEDURE TRAY) ×3 IMPLANT
PACK ROBOT UROLOGY CUSTOM (CUSTOM PROCEDURE TRAY) ×3 IMPLANT
PAD POSITIONING PINK XL (MISCELLANEOUS) ×3 IMPLANT
PORT ACCESS TROCAR AIRSEAL 12 (TROCAR) ×2 IMPLANT
PORT ACCESS TROCAR AIRSEAL 5M (TROCAR) ×1
RELOAD STAPLE 45 4.1 GRN THCK (STAPLE) IMPLANT
RELOAD STAPLE 60 2.6 WHT THN (STAPLE) ×14 IMPLANT
RELOAD STAPLE 60 4.1 GRN THCK (STAPLE) ×10 IMPLANT
RELOAD STAPLER GREEN 60MM (STAPLE) ×10 IMPLANT
RELOAD STAPLER WHITE 60MM (STAPLE) ×14 IMPLANT
RETRACTOR LONRSTAR 16.6X16.6CM (MISCELLANEOUS) IMPLANT
RETRACTOR STAY HOOK 5MM (MISCELLANEOUS) IMPLANT
RETRACTOR STER APS 16.6X16.6CM (MISCELLANEOUS)
RTRCTR WOUND ALEXIS 18CM MED (MISCELLANEOUS) ×3
SEAL CANN UNIV 5-8 DVNC XI (MISCELLANEOUS) ×8 IMPLANT
SEAL XI 5MM-8MM UNIVERSAL (MISCELLANEOUS) ×12
SET TRI-LUMEN FLTR TB AIRSEAL (TUBING) ×3 IMPLANT
SOLUTION ELECTROLUBE (MISCELLANEOUS) ×3 IMPLANT
SPONGE GAUZE 2X2 STER 10/PKG (GAUZE/BANDAGES/DRESSINGS)
SPONGE T-LAP 18X18 ~~LOC~~+RFID (SPONGE) ×6 IMPLANT
SPONGE T-LAP 4X18 ~~LOC~~+RFID (SPONGE) ×3 IMPLANT
STAPLE RELOAD 45 GRN (STAPLE) IMPLANT
STAPLE RELOAD 45MM GREEN (STAPLE)
STAPLER ECHELON LONG 60 440 (INSTRUMENTS) ×3 IMPLANT
STAPLER RELOAD GREEN 60MM (STAPLE) ×15
STAPLER RELOAD WHITE 60MM (STAPLE) ×21
STENT SET URETHERAL LEFT 7FR (STENTS) ×3 IMPLANT
STENT SET URETHERAL RIGHT 7FR (STENTS) ×3 IMPLANT
SURGIFLO W/THROMBIN 8M KIT (HEMOSTASIS) IMPLANT
SUT CHROMIC 4 0 RB 1X27 (SUTURE) ×3 IMPLANT
SUT ETHILON 3 0 PS 1 (SUTURE) ×3 IMPLANT
SUT MNCRL AB 4-0 PS2 18 (SUTURE) ×6 IMPLANT
SUT PDS AB 0 CTX 36 PDP370T (SUTURE) ×9 IMPLANT
SUT PDS AB 1 CT1 27 (SUTURE) ×6 IMPLANT
SUT SILK 3 0 SH 30 (SUTURE) IMPLANT
SUT SILK 3 0 SH CR/8 (SUTURE) ×3 IMPLANT
SUT V-LOC BARB 180 2/0GR6 GS22 (SUTURE)
SUT VIC AB 2-0 CT1 27 (SUTURE)
SUT VIC AB 2-0 CT1 27XBRD (SUTURE) IMPLANT
SUT VIC AB 2-0 SH 18 (SUTURE) IMPLANT
SUT VIC AB 2-0 SH 27 (SUTURE) ×3
SUT VIC AB 2-0 SH 27X BRD (SUTURE) ×2 IMPLANT
SUT VIC AB 2-0 UR5 27 (SUTURE) ×12 IMPLANT
SUT VIC AB 3-0 SH 27 (SUTURE) ×18
SUT VIC AB 3-0 SH 27X BRD (SUTURE) ×4 IMPLANT
SUT VIC AB 3-0 SH 27XBRD (SUTURE) ×8 IMPLANT
SUT VIC AB 4-0 RB1 27 (SUTURE) ×12
SUT VIC AB 4-0 RB1 27XBRD (SUTURE) ×8 IMPLANT
SUT VICRYL 0 UR6 27IN ABS (SUTURE) ×3 IMPLANT
SUT VLOC BARB 180 ABS3/0GR12 (SUTURE) ×9
SUTURE V-LC BRB 180 2/0GR6GS22 (SUTURE) IMPLANT
SUTURE VLOC BRB 180 ABS3/0GR12 (SUTURE) ×6 IMPLANT
SYR 27GX1/2 1ML LL SAFETY (SYRINGE) ×3 IMPLANT
SYR CONTROL 10ML LL (SYRINGE) IMPLANT
SYSTEM UROSTOMY GENTLE TOUCH (WOUND CARE) ×3 IMPLANT
TOWEL OR NON WOVEN STRL DISP B (DISPOSABLE) ×3 IMPLANT
TROCAR BLADELESS 15MM (ENDOMECHANICALS) ×3 IMPLANT
TROCAR XCEL NON-BLD 5MMX100MML (ENDOMECHANICALS) IMPLANT
TUBING CONNECTING 10 (TUBING) ×3 IMPLANT
WATER STERILE IRR 1000ML POUR (IV SOLUTION) ×3 IMPLANT
WATER STERILE IRR 3000ML UROMA (IV SOLUTION) ×3 IMPLANT

## 2021-05-18 NOTE — Discharge Instructions (Signed)

## 2021-05-18 NOTE — Anesthesia Procedure Notes (Signed)
Procedure Name: Intubation Date/Time: 05/18/2021 8:45 AM Performed by: Genelle Bal, CRNA Pre-anesthesia Checklist: Patient identified, Emergency Drugs available, Suction available and Patient being monitored Patient Re-evaluated:Patient Re-evaluated prior to induction Oxygen Delivery Method: Circle system utilized Preoxygenation: Pre-oxygenation with 100% oxygen Induction Type: IV induction Ventilation: Mask ventilation without difficulty Laryngoscope Size: Miller and 2 Grade View: Grade I Tube type: Oral Tube size: 7.5 mm Number of attempts: 1 Airway Equipment and Method: Stylet and Oral airway Placement Confirmation: ETT inserted through vocal cords under direct vision, positive ETCO2 and breath sounds checked- equal and bilateral Secured at: 21 cm Tube secured with: Tape Dental Injury: Teeth and Oropharynx as per pre-operative assessment

## 2021-05-18 NOTE — Transfer of Care (Signed)
Immediate Anesthesia Transfer of Care Note  Patient: Lance Friedman  Procedure(s) Performed: XI ROBOTIC ASSISTED LAPAROSCOPIC COMPLETE CYSTECTECTOMY, PROSTATECTOMY ILEAL CONDUIT LYMPH NODE DISSECTION (Bilateral) CYSTOSCOPY WITH INJECTION OF INDOCYANINE GREEN DYE  Patient Location: PACU  Anesthesia Type:General  Level of Consciousness: awake, alert  and oriented  Airway & Oxygen Therapy: Patient Spontanous Breathing and Patient connected to face mask oxygen  Post-op Assessment: Report given to RN and Post -op Vital signs reviewed and stable  Post vital signs: Reviewed and stable  Last Vitals:  Vitals Value Taken Time  BP 134/87 05/18/21 1351  Temp    Pulse 86 05/18/21 1353  Resp 12 05/18/21 1353  SpO2 100 % 05/18/21 1353  Vitals shown include unvalidated device data.  Last Pain:  Vitals:   05/18/21 0703  TempSrc: Oral  PainSc:          Complications: No notable events documented.

## 2021-05-18 NOTE — Brief Op Note (Signed)
05/17/2021 - 05/18/2021  1:41 PM  PATIENT:  Lance Friedman  75 y.o. male  PRE-OPERATIVE DIAGNOSIS:  BLADDER CANCER  POST-OPERATIVE DIAGNOSIS:  BLADDER CANCER  PROCEDURE:  Procedure(s) with comments: XI ROBOTIC ASSISTED LAPAROSCOPIC COMPLETE CYSTECTECTOMY, PROSTATECTOMY ILEAL CONDUIT (N/A) - 6 HRS LYMPH NODE DISSECTION (Bilateral) CYSTOSCOPY WITH INJECTION OF INDOCYANINE GREEN DYE (N/A)  SURGEON:  Surgeon(s) and Role:    [name], MD - Primary  PHYSICIAN ASSISTANT:   ASSISTANTS: Clemetine Marker PA   ANESTHESIA:   local and general  EBL:  150 mL   BLOOD ADMINISTERED:none  DRAINS:  1 - JP to bulb; 2 - RLQ Urostomy to gravity with Rt (red) and Lt (blue) bander stents    LOCAL MEDICATIONS USED:  MARCAINE     SPECIMEN:  Source of Specimen:  1 - pelvic lymph nodes; 2 - ureteral margins; 3 - bladder + prostate 4- Pelvic lymph nodes  DISPOSITION OF SPECIMEN:  PATHOLOGY  COUNTS:  YES  TOURNIQUET:  * No tourniquets in log *  DICTATION: .Other Dictation: Dictation Number [id number]  PLAN OF CARE: Admit to inpatient   PATIENT DISPOSITION:  PACU - hemodynamically stable.   Delay start of Pharmacological VTE agent (>24hrs) due to surgical blood loss or risk of bleeding: yes

## 2021-05-18 NOTE — Anesthesia Preprocedure Evaluation (Signed)
Anesthesia Evaluation  Patient identified by MRN, date of birth, ID band Patient awake    Reviewed: Allergy & Precautions, NPO status , Patient's Chart, lab work & pertinent test results  Airway Mallampati: II  TM Distance: >3 FB Neck ROM: Full    Dental   Pulmonary former smoker,    breath sounds clear to auscultation       Cardiovascular negative cardio ROS   Rhythm:Regular Rate:Normal     Neuro/Psych negative neurological ROS     GI/Hepatic Neg liver ROS, GERD  ,  Endo/Other  Hypothyroidism   Renal/GU negative Renal ROS     Musculoskeletal  (+) Arthritis ,   Abdominal   Peds  Hematology  (+) anemia ,   Anesthesia Other Findings   Reproductive/Obstetrics                             Lab Results  Component Value Date   WBC 6.1 05/17/2021   HGB 10.8 (L) 05/17/2021   HCT 31.5 (L) 05/17/2021   MCV 93.5 05/17/2021   PLT 240 05/17/2021   Lab Results  Component Value Date   CREATININE 0.68 05/17/2021   BUN 10 05/17/2021   NA 136 05/17/2021   K 4.0 05/17/2021   CL 100 05/17/2021   CO2 28 05/17/2021    Anesthesia Physical Anesthesia Plan  ASA: 3  Anesthesia Plan: General   Post-op Pain Management:    Induction: Intravenous  PONV Risk Score and Plan: 3 and Dexamethasone, Ondansetron and Treatment may vary due to age or medical condition  Airway Management Planned: Oral ETT  Additional Equipment: None  Intra-op Plan:   Post-operative Plan: Extubation in OR  Informed Consent: I have reviewed the patients History and Physical, chart, labs and discussed the procedure including the risks, benefits and alternatives for the proposed anesthesia with the patient or authorized representative who has indicated his/her understanding and acceptance.     Dental advisory given  Plan Discussed with: CRNA  Anesthesia Plan Comments:         Anesthesia Quick Evaluation

## 2021-05-18 NOTE — Anesthesia Postprocedure Evaluation (Signed)
Anesthesia Post Note  Patient: Lance Friedman  Procedure(s) Performed: XI ROBOTIC ASSISTED LAPAROSCOPIC COMPLETE CYSTECTECTOMY, PROSTATECTOMY ILEAL CONDUIT LYMPH NODE DISSECTION (Bilateral) CYSTOSCOPY WITH INJECTION OF INDOCYANINE GREEN DYE     Patient location during evaluation: PACU Anesthesia Type: General Level of consciousness: awake and alert Pain management: pain level controlled Vital Signs Assessment: post-procedure vital signs reviewed and stable Respiratory status: spontaneous breathing, nonlabored ventilation, respiratory function stable and patient connected to nasal cannula oxygen Cardiovascular status: blood pressure returned to baseline and stable Postop Assessment: no apparent nausea or vomiting Anesthetic complications: no   No notable events documented.  Last Vitals:  Vitals:   05/18/21 1545 05/18/21 1600  BP: (!) 131/98 133/88  Pulse: 92 84  Resp: 12 13  Temp:    SpO2: 100% 100%    Last Pain:  Vitals:   05/18/21 1515  TempSrc:   PainSc: Tyler Deis

## 2021-05-18 NOTE — Progress Notes (Signed)
Day of Surgery   Subjective/Chief Complaint:  1 - Muscle Invasive Bladder Cancer / Bladder Diverticulum - T2G3 large bladder mass near Rt UO / large diverticulum area by Ct and cysto 12/2020 on eval gross hematuria. No locally advanced / distant disease. Single ureters bilaterally. On curative intent path with neoadjuvant gem-cis per Dr. Alen Blew (started 01/2021) and then radical cystectomy scheduled 05/18/21. Restaging imaging mid 04/2021 favorable w/o overt metastatic disease and some likely chemo downstaging. Cr <1, Hgb 9.8, WBC OK.   2 - R>L Malignant Hydronephrosis - Cr 1.3's with R>L malig hydro. Rt neph tube placed 12/2020, exchanged 03/2021.   3 - Urinary Retention - foley placed 01/04/21 for new retention, now much improved local symptoms.   PMH sig for hypothroid, wrist surgery, mild obesity. No ischemic CV disease / blood thinners. No chest/abd surgery. 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 His PCP is Marcellus Scott MD.   Today " Lance Friedman " is seen to proceed with cystectomy. Completed bowel prep to clare, stomal marking. Hgb 10s.   Objective: Vital signs in last 24 hours: Temp:  [97.9 F (36.6 C)-98.2 F (36.8 C)] 98.2 F (36.8 C) (09/14 0529) Pulse Rate:  [74-96] 81 (09/14 0529) Resp:  [18-24] 18 (09/14 0529) BP: (133-151)/(95-103) 133/103 (09/14 0529) SpO2:  [98 %-100 %] 98 % (09/14 0529) Weight:  [83.1 kg] 83.1 kg (09/13 1330)    Intake/Output from previous day: 09/13 0701 - 09/14 0700 In: 19.3 [I.V.:19.3] Out: 725 [Urine:725] Intake/Output this shift: Total I/O In: -  Out: 400 [Urine:400]  Physical Exam Vitals reviewed.  HENT:     Head: Normocephalic.     Nose: Nose normal.  Eyes:     Pupils: Pupils are equal, round, and reactive to light.  Cardiovascular:     Rate and Rhythm: Normal rate.  Abdominal:     General: Abdomen is flat.     Comments: Stomal marking site noted  Genitourinary:    Comments: Rt neph tueb with non-foul urine.  Foley with non-foul urine.  Musculoskeletal:        General: Normal range of motion.     Cervical back: Normal range of motion.  Skin:    General: Skin is warm.  Neurological:     General: No focal deficit present.     Mental Status: He is alert.  Psychiatric:        Mood and Affect: Mood normal.   Lab Results:  Recent Labs    05/17/21 1416  WBC 6.1  HGB 10.8*  HCT 31.5*  PLT 240   BMET Recent Labs    05/17/21 1416  NA 136  K 4.0  CL 100  CO2 28  GLUCOSE 93  BUN 10  CREATININE 0.68  CALCIUM 9.8   PT/INR No results for input(s): LABPROT, INR in the last 72 hours. ABG No results for input(s): PHART, HCO3 in the last 72 hours.  Invalid input(s): PCO2, PO2  Studies/Results: No results found.  Anti-infectives: Anti-infectives (From admission, onward)    Start     Dose/Rate Route Frequency Ordered Stop   05/18/21 0600  piperacillin-tazobactam (ZOSYN) IVPB 3.375 g  Status:  Discontinued        3.375 g 100 mL/hr over 30 Minutes Intravenous 30 min pre-op 05/17/21 0834 05/17/21 1332   05/18/21 0600  piperacillin-tazobactam (ZOSYN) IVPB 3.375 g        3.375 g 100 mL/hr over 30 Minutes Intravenous 30 min pre-op 05/17/21 1332  05/17/21 1600  metroNIDAZOLE (FLAGYL) tablet 500 mg        500 mg Oral Every 4 hours 05/17/21 1355 05/17/21 2350   05/17/21 1600  neomycin (MYCIFRADIN) tablet 500 mg        500 mg Oral Every 4 hours 05/17/21 1355 05/17/21 2350       Assessment/Plan:  Proceed as planned with cystectomy / conduit diversion. Risks, benefits, alternatives, expected peri-op course discussed extensively previously and reiterated today.    [name] 05/18/2021

## 2021-05-19 ENCOUNTER — Encounter (HOSPITAL_COMMUNITY): Payer: Self-pay | Admitting: Urology

## 2021-05-19 LAB — BASIC METABOLIC PANEL
Anion gap: 8 (ref 5–15)
BUN: 9 mg/dL (ref 8–23)
CO2: 24 mmol/L (ref 22–32)
Calcium: 9.2 mg/dL (ref 8.9–10.3)
Chloride: 98 mmol/L (ref 98–111)
Creatinine, Ser: 0.83 mg/dL (ref 0.61–1.24)
GFR, Estimated: >60 mL/min (ref >60)
Glucose, Bld: 126 mg/dL — ABNORMAL HIGH (ref 70–99)
Potassium: 4.2 mmol/L (ref 3.5–5.1)
Sodium: 130 mmol/L — ABNORMAL LOW (ref 135–145)

## 2021-05-19 LAB — HEMOGLOBIN AND HEMATOCRIT, BLOOD
HCT: 26.2 % — ABNORMAL LOW (ref 39.0–52.0)
Hemoglobin: 9 g/dL — ABNORMAL LOW (ref 13.0–17.0)

## 2021-05-19 NOTE — Op Note (Signed)
NAMEHILDA, Lance Friedman MEDICAL RECORD NO: [id number] ACCOUNT NO: [id number] DATE OF BIRTH: [birth year] FACILITY: [facility] LOCATION: [facility] PHYSICIAN: [name], MD  Operative Report   DATE OF PROCEDURE: 05/18/2021  PREOPERATIVE DIAGNOSIS:  Muscle invasive bladder cancer with right malignant hydronephrosis.  PROCEDURE PERFORMED: 1.  Cystoscopy with indocyanine green dye injection. 2.  Robotic-assisted laparoscopic radical cystectomy with prostatectomy, bilateral pelvic lymphadenectomy and ileal conduit urinary diversion.  ESTIMATED BLOOD LOSS:  150 mL.  COMPLICATIONS:  None.  ASSISTANT:  Debbrah Alar, PA  SPECIMENS:   1.  Right distal ureteral margin. 2.  Final right ureteral margin. 3.  Left distal ureteral margin. 4.  Final left ureteral margin. 5.  Bladder plus prostate en bloc. 6.  Right external iliac nodes. 7.  Right obturator lymph nodes. 8.  Right common iliac lymph nodes. 9.  Aortic bifurcation lymph nodes. 10.  Left common iliac lymph nodes. 11.  Left external iliac lymph nodes. 12.  Left internal iliac lymph nodes. 13.  Left obturator lymph nodes.  DRAINS: 1.  Jackson-Pratt drain to bulb suction. 2.  Right lower quadrant urostomy to gravity drainage with right (red), left (blue) bander stents.  FINDINGS:  1.  Multiple sentinel lymph nodes on the right side denoted on pathology requisition. 2.  Minimal volume intraluminal residual tumor on cystoscopy.  INDICATIONS:  The patient is a pleasant 75 year old man with recent history of muscle invasive bladder cancer high grade, who also has malignant obstruction, making him clinical stage III.  He underwent nephrostomy tube for renal decompression.  Options  discussed for management including curative versus noncurative intent paths and he has been on curative intent path with neoadjuvant chemotherapy, which he tolerated well, with restaging imaging without advanced disease.  Further options were discussed  including  continued curative intent path with proceeding with cystoprostatectomy, urinary diversion.  He wished to proceed.  He was admitted yesterday for bowel prep, stomal marking and labs which were all favorable.  PROCEDURE IN DETAIL:  The patient being verified, procedure being cystoscopy with ICG dye injection, robotic-assisted prostatectomy with conduit diversion, node dissection was confirmed.  Procedure timeout was performed.  Intravenous antibiotics  administered.  General endotracheal anesthesia was introduced.  The patient was placed into a low lithotomy position, sterile field was created, prepped and draped the patient's penis, perineum and proximal thigh using iodine and infra-xiphoid abdomen  using chlorhexidine gluconate, after he was clipper shaved and further fashioned on the operating table using 3-inch tape over foam padding across the supra-xiphoid chest.  His right nephrostomy tube was capped and the in situ Foley catheter had been  removed.  An LAVH type drape was applied.  Attention was then directed at cystourethroscopy, this was performed using a 24-French injection scope set with 0-degree lens.  Inspection of the anterior and posterior urethra were unremarkable.  Inspection of bladder revealed diffuse  trabeculation that was moderate, right bladder diverticulum.  There was minimal volume intraluminal tumor; however, given prior known area of tumor on the right side, 3 mL of indocyanine green dye was injected in the right hemitrigone area across 7  submucosal blebs for sentinel lymphangiography and a silicone catheter was then placed per urethra for straight drain, 10 mL water in the balloon.  Next, high flow, low pressure pneumoperitoneum was obtained using Veress technique in the supraumbilical  midline, having passed the aspiration drop test.  An 8 mm robotic camera port was then placed in the same location.  Laparoscopic examination of the peritoneal cavity  revealed no significant  adhesions, no visceral injury.  Additional ports had been  placed as follows:  Right paramedian 8 mm robotic port, right paramedian 15 mm assistant port at the previously marked stomal site, right far lateral 12 mm AirSeal assistant port, a left paramedian 8 mm robotic port, left far lateral 8 mm robotic port.   Robot was docked and passed the electronic checks.  Next, attention was directed at development of the left retroperitoneum. Incision was made lateral to the descending colon from the area of the iliac crossing superiorly for a distance of approximately  10 cm, inferiorly coursing lateral to the left medial umbilical ligament towards the anterior abdominal wall.  This created a large posterior peritoneal flap, which was used to carefully retract the colon and bowel medially.  The left ureter was  encountered as it coursed over the iliac vessels, marked with vessel loop, dissected proximally for a distance of approximately 8 cm and then distally to the ureterovesical junction where it was doubly clipped and ligated, proximal ureter with a dyed tag  suture.  Frozen section sent, negative for carcinoma. Left ureter was tucked out of the true pelvis.  Attention was directed to the left-sided pelvic lymph node dissection. Sentinel lymphangiography revealed excellent parenchymal uptake of the area of the bladder on  the right side as well as several sentinel lymphatic channels coursing towards the right-sided pelvic lymph nodes.  However, there was no crossover to the left side. As such, a standard template lymphadenectomy was performed on left side. First, the left  common iliac group from the area of the aortic bifurcation to the iliac bifurcation, lymphostasis achieved with cold clips, set aside labeled left common iliac lymph nodes.  Next, the left external iliac group was dissected free, with the boundaries  being iliac bifurcation and internal inguinal ring, lymphostasis achieved with cold clips, set  aside labeled left external iliac lymph nodes.  Next, left obturator group dissected free with the boundaries being left external iliac vein, pelvic sidewall,  obturator nerve.  Lymphostasis achieved with cold clips, set aside labeled left obturator lymph nodes and additional lymphatic tissue was dissected free off the area of the internal iliac vessels from the area of the iliac bifurcation to the area of the  superior vesical artery, this was set aside labeled left internal iliac lymph nodes.  Obturator nerve was inspected following these maneuvers, found to be uninjured.  The left bladder wall was then swept away from pelvic sidewall through the endopelvic  fascia all the way to the prostatic apex on the left side.  Attention was then directed at right-sided dissection. First, the area of the ileocecal junction was identified as noted by the appendix and the terminal ileum was dissected proximally and  distally, approximately 14 cm to an area that appeared to have suitable vascularity and mobility to be formed into the conduit.  This was marked using a silk tag suture in a single clip distal to denote proximal to distal orientation.  An incision was  made lateral to the cecum, in the posterior peritoneum superiorly for a distance of approximately 10 cm and then inferiorly coursing lateral to the right medial umbilical ligament towards the anterior abdominal wall.  This created a large retroperitoneal  flap on the right side, which was retracted medially to retract the bowel and the right ureter was encountered as it coursed over iliac vessels.  This was marked with vessel loop, dissected proximally for a distance of approximately  8 cm distally to the  ureterovesical junction where doubly clipped and ligated.  The right ureter had minimal dilation, it was quite advantageous and some desmoplastic reaction, however, not severe.  The proximal suture being undyed tag suture.  Frozen section negative for   carcinoma. Right ureter was tucked out of the true pelvis.  Attention was turned to the right sided pelvic lymphadenectomy.  This was performed as per the left.  However, there are multiple sentinel lymph nodes noted.  These were denoted on the pathology requisition.  The  right obturator nerve was inspected following these maneuvers and noted to be uninjured.  The right bladder and prostate were swept away from the pelvic sidewall towards the area of the prostatic apex.  Attention was directed at the transposition of the  left ureter.  A window was made just anterior to the aortic bifurcation behind the area of the colon and the colonic mesentery from the right side to the left side and the ureter transposed in a retroperitoneal fashion to the right side.  On the right  ureter, left ureter, terminal ileum, tag sutures were placed into a Hem-o-lok clip for later conduit reconstruction.  Additional lymphatic tissue was taken from the area of the aortic bifurcation.  Posterior dissection was performed by placing an  inverted U incision posterior to the bladder, connecting the 2 previous inferior aspects of the lateral peritoneal incisions.  This created a posterior peritoneal flap, which was retracted inferiorly and posteriorly and the plane behind the vas and  seminal vesicles were dissected towards the area of the apex of the prostate.  This exposed the vascular pedicles of the bladder and prostate which were controlled using white load stapler x2 each side, which resulted in excellent hemostatic control.   The space of Retzius was developed between the medial umbilical ligaments, dropping the bladder away from the anterior abdominal wall.  This exposed the dorsal venous complex which was controlled using green load stapler.  Final apical dissection was  performed in the anterior plane, placing the bladder and prostate on superior traction, transecting the membranous urethra coldly, doubly clipping the in  situ Foley catheter, using it as a bucket handle.  This completely freed up the bladder plus  prostate en bloc specimen, which was placed in an extra large EndoCatch bag and placed into the area of the left upper quadrant.  The pelvis was inspected.  Hemostasis was excellent.  Sponge and needle counts were correct.  Digital rectal exam was  performed using indicator glove and laparoscopic vision.  No evidence of rectal violation was noted.  Closed suction drain was brought through the previous left lateral most robotic site into the area of peritoneal cavity.  The specimen retrieval bag  string was brought through the left paramedian robotic port site and the right ureter, left ureter, terminal ileum tag sutures were grasped with a laparoscopic grasper via the right sided 15 mm port site such that this did not cross midline.  Robot was  then undocked.  Specimen was retrieved by extending the previous camera port site inferiorly for a distance of approximately 6 cm erring to the left side of the umbilicus, removing the bladder plus prostate specimen, setting aside for permanent  pathology.  An Alexis type wound protector was then placed in this location and the right ureter, left ureter, terminal ileum tag sutures were brought through this and re-marked with Babcock clamps.  The previously marked area of distal ileum appeared to  again be suitable geometry and vascularity for conduit malformation and a 14 cm segment was taken out of continuity using green load stapler proximal and distal.  The distal mesentery was developed using 1.5 loads of white load stapler and the proximal  developed using 1 load of white load stapler.  This was performed, taking exquisite care to avoid devascularization of the conduit or anastomotic segments which did not occur.  The conduit was then lined to retroperitoneal orientation.  Bowel-to-bowel  anastomosis was performed using green load stapler x1.5 in the antimesenteric  border, with the free end being oversewn using running silk with a separate imbricating layer of running silk.  The acute angle of the anastomosis was bolstered using  interrupted silk and the mesenteric defect was reapproximated using interrupted silk.  The bowel-to-bowel anastomosis was visibly viable and palpably patent, it was redelivered into the abdominal cavity.  Attention was then redirected at the conduit.   The proximal staple line of the conduit was excluded using running Vicryl, the distal staple line removed and ureteroenteric anastomosis was then performed, first of the right ureter by excising a 4 mm segment of proximal conduit serosal mucosa, placing  4 mucosal everting sutures in a quadrant fashion, spatulating the distal ureter, placing a heel stitch of 4-0 Vicryl, placing a red color bander stent 26 cm to anastomosis.  This created two separate suture lines which were reapproximated using running  4-0 Vicryl.  This resulted in excellent tension-free ureteroenteric anastomosis.  The right red colored bander stent was then anchored to the midpoint of the conduit using interrupted chromic through and through in an air knot fashion. The contralateral  ureter was then anastomosed in a similar fashion by placing a blue color bander stent 26 cm to anastomosis.  The previously marked 15 mm port site was then excised for approximately quarter diameter of the skin and fat down to the level of fascia, which  was dilated to accommodate 2 surgeon's fingers and 4-quadrant 0 Vicryl sutures were placed at the level of the fascia.  The conduit was brought through this and anchored to prevent parastomal hernia formation and 4 rosebudding sutures were preplaced.   Attention was then directed at the fascial closure.  Fascia was reapproximated using figure-of-eight PDS x6 followed by reapproximation of Scarpa's with running Vicryl.  All incision sites were infiltrated with dilute lipolyzed Marcaine and closed  at the  level of skin using subcuticular Monocryl and final conduit malformation was performed by tying the rosebudding sutures down, resulted in excellent rosebudding of the stoma and further bowel mucosa to skin approximation was performed using interrupted  Vicryl x2 each quadrant. Stoma appliance was placed.  Drain stitch was applied.  Procedure was terminated.  The patient tolerated the procedure well.  No immediate perioperative complications.  The patient taken to postanesthesia care in stable  condition.  Plan for progressive care admission.  Please note, first assistant Debbrah Alar was crucial for all portions of the surgery today.  She provided invaluable retraction, suctioning, vascular clipping, vascular stapling and general first assistance.   SHW D: 05/18/2021 1:56:55 pm T: 05/19/2021 1:13:00 am  JOB: [id number]

## 2021-05-19 NOTE — Evaluation (Signed)
Physical Therapy Evaluation Patient Details Name: Lance Friedman MRN: [id number] DOB: December 04, 1945 Today's Date: 05/19/2021  History of Present Illness  Pt is 75 yo male with muscle invasive bladder CA/bladder diverticulum and is s/p cystoscopy and laparoscopic radical cystectomy with prostatectomy, bil pelvic lymphadenectomy, and ileal conduit urinary diversion on 05/18/21. Pt with hx including arthritis, bladder CA, chronic neck pain, gout, HLD  Clinical Impression   Pt admitted with above diagnosis. At baseline, pt is independent without AD, he does have 8-9 steps to enter his apartment.  At evaluation, pt requiring min guard-min A for transfers and ambulated 160' with RW and min guard.  He did ambulate slowly.  Pt reports limited support at home b/c lives alone and granddaughter works.  States granddaughter could likely check on him before/after work and his AA friends could also check in and likely bring food, but otherwise would be alone.  Pt feels that he can manage at home but states he was told he would likely be here a week before going home, he is somewhat concerned about going home if leaving in the next 2-3 days.  Pt currently with functional limitations due to the deficits listed below (see PT Problem List). Pt will benefit from skilled PT to increase their independence and safety with mobility to allow discharge to the venue listed below.          Recommendations for follow up therapy are one component of a multi-disciplinary discharge planning process, led by the attending physician.  Recommendations may be updated based on patient status, additional functional criteria and insurance authorization.  Follow Up Recommendations Home health PT;Supervision - Intermittent    Equipment Recommendations  Rolling walker with 5" wheels    Recommendations for Other Services       Precautions / Restrictions Precautions Precautions: Fall Precaution Comments: JP drain      Mobility  Bed  Mobility Overal bed mobility: Needs Assistance Bed Mobility: Sit to Supine       Sit to supine: Min assist   General bed mobility comments: Assist for legs    Transfers Overall transfer level: Needs assistance Equipment used: Rolling walker (2 wheeled) Transfers: Sit to/from Stand Sit to Stand: Min guard         General transfer comment: slow to rise but steady  Ambulation/Gait Ambulation/Gait assistance: Min guard Gait Distance (Feet): 160 Feet Assistive device: Rolling walker (2 wheeled) Gait Pattern/deviations: Step-to pattern;Decreased stride length Gait velocity: decreased   General Gait Details: Cues for RW proximity; no LOB but did require RW, moved slowly, and altered gait pattern due to pain  Stairs            Wheelchair Mobility    Modified Rankin (Stroke Patients Only)       Balance Overall balance assessment: Needs assistance Sitting-balance support: No upper extremity supported Sitting balance-Leahy Scale: Good     Standing balance support: Bilateral upper extremity supported;No upper extremity supported Standing balance-Leahy Scale: Fair Standing balance comment: Could static stand without AD, ambulated with RW                             Pertinent Vitals/Pain Pain Assessment: 0-10 Pain Score: 7  Pain Location: surgical site Pain Descriptors / Indicators: Discomfort;Sore Pain Intervention(s): Limited activity within patient's tolerance;Monitored during session;Premedicated before session    [place] expects to be discharged to:: Private residence Living Arrangements: Alone Available Help at Discharge: Family;Available PRN/intermittently;Friend(s) (granddaughter  works) Type of Home: Apartment (2 story house that has been turned into apt) Home Access: Stairs to enter Entrance Stairs-Rails: Right;Left Entrance Stairs-Number of Steps: up a small hill then 8-9 steps Home Layout: One level Home Equipment:  Shower seat      Prior Function Level of Independence: Independent               Hand Dominance        Extremity/Trunk Assessment   Upper Extremity Assessment Upper Extremity Assessment: Overall WFL for tasks assessed    Lower Extremity Assessment Lower Extremity Assessment: Overall WFL for tasks assessed    Cervical / Trunk Assessment Cervical / Trunk Assessment: Other exceptions Cervical / Trunk Exceptions: Posture overall WFL but some abdomen pain/trunk pain due to surgery  Communication   Communication: No difficulties  Cognition Arousal/Alertness: Awake/alert Behavior During Therapy: WFL for tasks assessed/performed Overall Cognitive Status: Within Functional Limits for tasks assessed                                        General Comments General comments (skin integrity, edema, etc.): Discussed need for intermittent support at home  -pt reports feels like he won't be ready to go home tomorrow    Exercises     Assessment/Plan    PT Assessment Patient needs continued PT services  PT Problem List Decreased strength;Decreased mobility;Decreased activity tolerance;Decreased balance;Decreased knowledge of use of DME;Pain       PT Treatment Interventions DME instruction;Therapeutic activities;Gait training;Therapeutic exercise;Patient/family education;Functional mobility training;Stair training;Balance training    PT Goals (Current goals can be found in the Care Plan section)  Acute Rehab PT Goals Patient Stated Goal: return home PT Goal Formulation: With patient Time For Goal Achievement: 06/02/21 Potential to Achieve Goals: Good    Frequency Min 3X/week   Barriers to discharge Decreased caregiver support      Co-evaluation               AM-PAC PT "6 Clicks" Mobility  Outcome Measure Help needed turning from your back to your side while in a flat bed without using bedrails?: A Little Help needed moving from lying on your  back to sitting on the side of a flat bed without using bedrails?: A Little Help needed moving to and from a bed to a chair (including a wheelchair)?: A Little Help needed standing up from a chair using your arms (e.g., wheelchair or bedside chair)?: A Little Help needed to walk in hospital room?: A Little Help needed climbing 3-5 steps with a railing? : A Little 6 Click Score: 18    End of Session   Activity Tolerance: Patient tolerated treatment well Patient left: in bed;with call bell/phone within reach;with bed alarm set Nurse Communication: Mobility status PT Visit Diagnosis: Other abnormalities of gait and mobility (R26.89);Muscle weakness (generalized) (M62.81)    Time: [id number] PT Time Calculation (min) (ACUTE ONLY): 23 min   Charges:   PT Evaluation $PT Eval Low Complexity: 1 Low PT Treatments $Gait Training: 8-22 mins        Abran Richard, PT Acute Rehab Services Pager [phone] [facility] [place] 05/19/2021, 11:49 AM

## 2021-05-19 NOTE — Progress Notes (Signed)
Urology Progress Note   1 Day Post-Op from RC/IC.   Subjective: NAEON.  Pain well controlled Tolerated PO without N/V No gas or BM yet Has not ambulated yet Moderate leakage around JP site  Objective: Vital signs in last 24 hours: Temp:  [98.1 F (36.7 C)-98.9 F (37.2 C)] 98.9 F (37.2 C) (09/15 0430) Pulse Rate:  [69-97] 80 (09/15 0430) Resp:  [11-20] 17 (09/15 0430) BP: (114-134)/(74-98) 117/74 (09/15 0430) SpO2:  [98 %-100 %] 99 % (09/15 0430)  Intake/Output from previous day: 09/14 0701 - 09/15 0700 In: 2375 [I.V.:2225; IV Piggyback:150] Out: [id number] [Urine:1300; Drains:40; Blood:150] Intake/Output this shift: No intake/output data recorded.  Physical Exam:  General: Alert and oriented CV: Regular rate Lungs: No increased work of breathing Abdomen: Soft, appropriately tender. Incisions c/d/i. JP SS GU: Ostomy pink and patent w/ bilateral stents in place, draining clear yellow urine  Ext: NT, No erythema  Lab Results: Recent Labs    05/17/21 1416 05/18/21 1357 05/19/21 0434  HGB 10.8* 9.8* 9.0*  HCT 31.5* 28.5* 26.2*   Recent Labs    05/17/21 1416 05/19/21 0434  NA 136 130*  K 4.0 4.2  CL 100 98  CO2 28 24  GLUCOSE 93 126*  BUN 10 9  CREATININE 0.68 0.83  CALCIUM 9.8 9.2    Studies/Results: No results found.  Assessment/Plan:  75 y.o. male s/p RC/IC.  Overall doing well post-op.   - Advance to [facility] - Continue IVFs - Daily labs - WOCN teaching - PT eval and treat - OOB, IS - Continue alvimopan, AROBF  Dispo: Floor status   LOS: 2 days

## 2021-05-19 NOTE — Consult Note (Addendum)
[facility] Nurse ostomy consult note Pt had urostomy surgery performed yesterday.  Current pouch is intact with good seal, 2 strints in place, stoma red and viable when visualized through the pouch.  Discussed plan of care with patient.  He states there are no family members or friends that need to be present for an ostomy teaching session.  He requests that we perform the first post-op pouch change demonstration tomorrow, since he still feels a little groggy on the first post op day. Supplies left in the room for staff nurses' use.  I will return tomorrow as requested to perform a teaching session. Thank-you,  Julien Girt MSN, [place], [place], [place], [place]

## 2021-05-19 NOTE — Progress Notes (Signed)
Mobility Specialist - Progress Note    05/19/21 1651  Mobility  Activity Ambulated in hall  Level of Assistance Contact guard assist, steadying assist  [place] wheel walker  Distance Ambulated (ft) 210 ft  Mobility Ambulated with assistance in hallway  Mobility Response Tolerated well  Mobility performed by Mobility specialist  $Mobility charge 1 Mobility    Upon entering, pt was agreeable to ambulate and used RW to walk 210 ft in hallway. 1 standing rest break utilized to practice pursed breathing. Pt stated feeling "more stable" during walk. At end of session, pt returned to recliner and left with call bell at side and NT in room.   Julian Specialist Acute Rehabilitation Services Phone: [phone] 05/19/21, 4:53 PM

## 2021-05-20 LAB — HEMOGLOBIN AND HEMATOCRIT, BLOOD
HCT: 27.1 % — ABNORMAL LOW (ref 39.0–52.0)
Hemoglobin: 9.3 g/dL — ABNORMAL LOW (ref 13.0–17.0)

## 2021-05-20 LAB — BASIC METABOLIC PANEL
Anion gap: 5 (ref 5–15)
BUN: 7 mg/dL — ABNORMAL LOW (ref 8–23)
CO2: 27 mmol/L (ref 22–32)
Calcium: 8.7 mg/dL — ABNORMAL LOW (ref 8.9–10.3)
Chloride: 97 mmol/L — ABNORMAL LOW (ref 98–111)
Creatinine, Ser: 0.8 mg/dL (ref 0.61–1.24)
GFR, Estimated: >60 mL/min (ref >60)
Glucose, Bld: 112 mg/dL — ABNORMAL HIGH (ref 70–99)
Potassium: 3.8 mmol/L (ref 3.5–5.1)
Sodium: 129 mmol/L — ABNORMAL LOW (ref 135–145)

## 2021-05-20 MED ORDER — ACETAMINOPHEN 500 MG PO TABS
1000.0000 mg | ORAL_TABLET | Freq: Four times a day (QID) | ORAL | Status: DC
  Administered 2021-05-20 – 2021-05-27 (×28): 1000 mg via ORAL
  Filled 2021-05-20 (×28): qty 2

## 2021-05-20 MED ORDER — SODIUM CHLORIDE 0.9 % IV SOLN: INTRAVENOUS | Status: DC

## 2021-05-20 NOTE — Progress Notes (Signed)
Physical Therapy Treatment Patient Details Name: Lance Friedman MRN: [id number] DOB: August 16, 1946 Today's Date: 05/20/2021   History of Present Illness Pt is 75 yo male with muscle invasive bladder CA/bladder diverticulum and is s/p cystoscopy and laparoscopic radical cystectomy with prostatectomy, bil pelvic lymphadenectomy, and ileal conduit urinary diversion on 05/18/21. Pt with hx including arthritis, bladder CA, chronic neck pain, gout, HLD    PT Comments    Pt reports he feel a little better today, but remains limited by pain. Pt ambulated 165 ft in the hall with RW. Pt alters step length to minimize pain. Pt was unable to tolerate stair training today. Pt is slowly progressing with mobility goals. Recommend continued acute PT to maximize mobility and independence for return home with intermittent supervision.  Recommendations for follow up therapy are one component of a multi-disciplinary discharge planning process, led by the attending physician.  Recommendations may be updated based on patient status, additional functional criteria and insurance authorization.  Follow Up Recommendations  Home health PT;Supervision - Intermittent     Equipment Recommendations       Recommendations for Other Services       Precautions / Restrictions Precautions Precautions: Fall Precaution Comments: JP drain Restrictions Weight Bearing Restrictions: No     Mobility  Bed Mobility Overal bed mobility: Needs Assistance Bed Mobility: Sit to Supine     Supine to sit: Min assist;HOB elevated Sit to supine: Min assist;HOB elevated   General bed mobility comments: Assist for legs, assistance with trunk support to sit EOB, sit to stand with RW side step up to [facility] to position better in bed    Transfers Overall transfer level: Needs assistance Equipment used: Rolling walker (2 wheeled) Transfers: Sit to/from Stand Sit to Stand: Min guard         General transfer comment: cues for hand  placement, elevated bed  Ambulation/Gait Ambulation/Gait assistance: Min guard Gait Distance (Feet): 165 Feet Assistive device: Rolling walker (2 wheeled) Gait Pattern/deviations: Step-to pattern;Decreased stride length Gait velocity: decreased   General Gait Details: Cues for RW proximity; no LOB but did require RW, moved slowly, and altered gait pattern due to pain   Stairs Stairs:  (pt declined due to level of pain)           Wheelchair Mobility    Modified Rankin (Stroke Patients Only)       Balance Overall balance assessment: Needs assistance Sitting-balance support: No upper extremity supported Sitting balance-Leahy Scale: Good     Standing balance support: Bilateral upper extremity supported;No upper extremity supported Standing balance-Leahy Scale: Fair                              Cognition Arousal/Alertness: Awake/alert Behavior During Therapy: WFL for tasks assessed/performed Overall Cognitive Status: Within Functional Limits for tasks assessed                                        Exercises      General Comments        Pertinent Vitals/Pain Pain Assessment: 0-10 Pain Score: 7  Pain Location: surgical site Pain Descriptors / Indicators: Discomfort;Aching Pain Intervention(s): Limited activity within patient's tolerance;Repositioned;Monitored during session;Premedicated before session    Home Living  Prior Function            PT Goals (current goals can now be found in the care plan section) Progress towards PT goals: Progressing toward goals    Frequency    Min 3X/week      PT Plan Current plan remains appropriate    Co-evaluation              AM-PAC PT "6 Clicks" Mobility   Outcome Measure  Help needed turning from your back to your side while in a flat bed without using bedrails?: A Little Help needed moving from lying on your back to sitting on the side of a  flat bed without using bedrails?: A Little Help needed moving to and from a bed to a chair (including a wheelchair)?: A Little Help needed standing up from a chair using your arms (e.g., wheelchair or bedside chair)?: A Little Help needed to walk in hospital room?: A Little Help needed climbing 3-5 steps with a railing? : A Lot 6 Click Score: 17    End of Session Equipment Utilized During Treatment: Gait belt Activity Tolerance: Patient limited by fatigue;Patient limited by pain Patient left: in bed;with call bell/phone within reach;with bed alarm set Nurse Communication: Mobility status PT Visit Diagnosis: Other abnormalities of gait and mobility (R26.89);Muscle weakness (generalized) (M62.81)     Time: [id number] PT Time Calculation (min) (ACUTE ONLY): 29 min  Charges:  $Gait Training: 8-22 mins $Therapeutic Activity: 8-22 mins                     Lelon Mast 05/20/2021, 1:32 PM

## 2021-05-20 NOTE — Progress Notes (Addendum)
Urology Progress Note   2 Days Post-Op from RC/IC.   Subjective: NAEON.  Pain well controlled Tolerated clears. Some nausea overnight but none this AM. No emesis. No gas or BM yet Ambulated several times yesterday. Working with PT. Moderate leakage around JP site. Otherwise fairly low output.  WOCN following  Objective: Vital signs in last 24 hours: Temp:  [97.7 F (36.5 C)-98.3 F (36.8 C)] 98.3 F (36.8 C) (09/16 0500) Pulse Rate:  [69-88] 81 (09/16 0500) Resp:  [16-18] 16 (09/16 0500) BP: (105-150)/(75-93) 150/93 (09/16 0500) SpO2:  [68 %-98 %] 68 % (09/16 0500)  Intake/Output from previous day: 09/15 0701 - 09/16 0700 In: 2802.3 [P.O.:480; I.V.:2129.7; IV Piggyback:192.6] Out: 2370 [Urine:2250; Drains:120] Intake/Output this shift: No intake/output data recorded.  Physical Exam:  General: Alert and oriented CV: Regular rate Lungs: No increased work of breathing Abdomen: Soft, appropriately tender. Moderately distended. Incisions c/d/i. JP SS GU: Ostomy pink and patent w/ bilateral stents in place, draining clear yellow urine  Ext: NT, No erythema  Lab Results: Recent Labs    05/18/21 1357 05/19/21 0434 05/20/21 0431  HGB 9.8* 9.0* 9.3*  HCT 28.5* 26.2* 27.1*    Recent Labs    05/19/21 0434 05/20/21 0431  NA 130* 129*  K 4.2 3.8  CL 98 97*  CO2 24 27  GLUCOSE 126* 112*  BUN 9 7*  CREATININE 0.83 0.80  CALCIUM 9.2 8.7*     Studies/Results: No results found.  Assessment/Plan:  75 y.o. male s/p RC/IC.  Overall doing well post-op.   - Continue CLD until ROBF - Continue IVFs - Daily labs - WOCN teaching - Continue PT - OOB, IS - Continue alvimopan, AROBF  Dispo: Floor status   LOS: 3 days    I have seen and examined the patient and agree with Dr. Gerilyn Pilgrim assessment and plan.   S: 1 - Bladder Cancer - s/p robotic cystoprostatectomy with pelvic node dissection and conduit diversion on 05/18/21. Path pending. Admitted 9/13 for bowel  prep and stomal marking. Has Rt neph tube (currently capped).  2 - Peri-OP Ileus - s/p small bowel anastamosis as part of conduit diversion. Received entered pre/post-op. NPO intially with ice chips, advanced to clears POD1.  3 - Disposition / Rehab - pt independent at baseline. Ostomy RN team working with pt in house. PT eval 9/15 recs home health PT and rolling walker.   4- Right Malignant Ureteral Obstruction - rt neph tube dependant pre-op due to obstruciton from bladder cancer. Tube capped as part of surgery 9/16.  O: NAD, AOX3, pleasant, wearign glasses Non-labored breathing on RA RRR SNTND, RLQ Urostomy pink/patent with Rt (red) and Lt (blue) bander stents and copious non-foul urine, scant mucus. No c/c/e, SCD's in place  Hgb and Cr stable.  Path pending.  A/P: Doing well POD 2 s/p major extirpative surgery for bladder cancer. Continue clears untile flatus. Appreciate ostomy RN and PT teams. Goals for DC discussed and typical 5-7 day hospital stay.  Remain in house for now.

## 2021-05-20 NOTE — Consult Note (Addendum)
[place] Nurse ostomy consult note Stoma type/location: Urostomy stoma from surgery on 9/14. Stomal assessment/size: Stoma is red and viable, 1 1/2 inches, above skin level.  2 stints in place Peristomal assessment: few scattered partial thickness wounds are red and dry where previous blisters have ruptured near the outer tape boarders.  Output: mod amt yellow urine Ostomy pouching: 1pc. Education provided:  Demonstrated pouch change using barrier ring Kellie Simmering # [phone]) and one piece pouch Kellie Simmering # 3).  Pt watched using a hand-held mirror and asked questions.  He states; "I didn't know I would have to wear a bag forever and change it twice a week. This is a lot to deal with." Pt was able to open and close spout to empty and connect and remove bedside drainage bag.  Discussed pouching routines and ordering supplies.  Recommend home health assistance after discharge until patient feels competent to perform pouch changes.  He states he does not have any family members at home to assist.  [place] team will perform another pouch change and teaching session later this week.  5 extra sets of barrier rings and pouches left at the bedside, along with educational materials. Enrolled patient in [place] program: Yes, previously  Thank-you,  Julien Girt MSN, [place], [place], [place], [place]

## 2021-05-20 NOTE — Care Management Important Message (Signed)
Important Message  Patient Details IM Letter given to the Patient. Name: Lance Friedman MRN: [id number] Date of Birth: 1946-07-15   Medicare Important Message Given:  Yes     [name] 05/20/2021, 11:33 AM

## 2021-05-21 LAB — TYPE AND SCREEN
ABO/RH(D): O POS
Antibody Screen: NEGATIVE
Unit division: 0
Unit division: 0

## 2021-05-21 LAB — CREATININE, FLUID (PLEURAL, PERITONEAL, JP DRAINAGE): Creat, Fluid: 0.6 mg/dL

## 2021-05-21 LAB — BASIC METABOLIC PANEL
Anion gap: 8 (ref 5–15)
BUN: 6 mg/dL — ABNORMAL LOW (ref 8–23)
CO2: 27 mmol/L (ref 22–32)
Calcium: 9 mg/dL (ref 8.9–10.3)
Chloride: 97 mmol/L — ABNORMAL LOW (ref 98–111)
Creatinine, Ser: 0.63 mg/dL (ref 0.61–1.24)
GFR, Estimated: >60 mL/min (ref >60)
Glucose, Bld: 104 mg/dL — ABNORMAL HIGH (ref 70–99)
Potassium: 3.8 mmol/L (ref 3.5–5.1)
Sodium: 132 mmol/L — ABNORMAL LOW (ref 135–145)

## 2021-05-21 LAB — BPAM RBC
Blood Product Expiration Date: 202210182359
Blood Product Expiration Date: 202210182359
Unit Type and Rh: 5100
Unit Type and Rh: 5100

## 2021-05-21 LAB — HEMOGLOBIN AND HEMATOCRIT, BLOOD
HCT: 28.4 % — ABNORMAL LOW (ref 39.0–52.0)
Hemoglobin: 9.7 g/dL — ABNORMAL LOW (ref 13.0–17.0)

## 2021-05-21 LAB — GLUCOSE, CAPILLARY: Glucose-Capillary: 116 mg/dL — ABNORMAL HIGH (ref 70–99)

## 2021-05-21 MED ORDER — ENOXAPARIN SODIUM 40 MG/0.4ML IJ SOSY
40.0000 mg | PREFILLED_SYRINGE | INTRAMUSCULAR | Status: DC
  Administered 2021-05-21 – 2021-05-27 (×7): 40 mg via SUBCUTANEOUS
  Filled 2021-05-21 (×6): qty 0.4

## 2021-05-21 NOTE — Progress Notes (Addendum)
Urology Inpatient Progress Report  Bladder cancer ([place]) [C67.9]  Procedure(s): XI ROBOTIC ASSISTED LAPAROSCOPIC COMPLETE CYSTECTECTOMY, PROSTATECTOMY ILEAL CONDUIT LYMPH NODE DISSECTION CYSTOSCOPY WITH INJECTION OF INDOCYANINE GREEN DYE  3 Days Post-Op   Intv/Subj: No acute events overnight. Patient is without complaint.  Passing flatus.  He is hungry.  Active Problems:   Bladder cancer ([place])  Current Facility-Administered Medications  Medication Dose Route Frequency Provider Last Rate Last Admin   0.9 %  sodium chloride infusion   Intravenous Continuous [name], MD 50 mL/hr at 05/21/21 0858 New Bag at 05/21/21 0858   acetaminophen (TYLENOL) tablet 1,000 mg  1,000 mg Oral Q6H Reola Mosher, MD   1,000 mg at 05/21/21 0534   alvimopan (ENTEREG) capsule 12 mg  12 mg Oral BID Debbrah Alar, PA-C   12 mg at 05/21/21 0857   diphenhydrAMINE (BENADRYL) injection 12.5 mg  12.5 mg Intravenous Q6H PRN Debbrah Alar, PA-C       Or   diphenhydrAMINE (BENADRYL) 12.5 MG/5ML elixir 12.5 mg  12.5 mg Oral Q6H PRN Debbrah Alar, PA-C       famotidine (PEPCID) tablet 20 mg  20 mg Oral Daily PRN Debbrah Alar, PA-C   20 mg at 05/19/21 0603   HYDROmorphone (DILAUDID) injection 0.5-1 mg  0.5-1 mg Intravenous Q2H PRN Debbrah Alar, PA-C   1 mg at 05/19/21 2323   levothyroxine (SYNTHROID) tablet 112 mcg  112 mcg Oral QAC breakfast Debbrah Alar, PA-C   112 mcg at 05/21/21 0534   mupirocin ointment (BACTROBAN) 2 % 1 application  1 application Nasal BID Debbrah Alar, PA-C   1 application at [id number]   naphazoline-glycerin (CLEAR EYES REDNESS) ophth solution 1-2 drop  1-2 drop Right Eye QID PRN Reola Mosher, MD   1 drop at 05/19/21 0300   ondansetron (ZOFRAN) injection 4 mg  4 mg Intravenous Q4H PRN Debbrah Alar, PA-C       oxyCODONE (Oxy IR/ROXICODONE) immediate release tablet 5 mg  5 mg Oral Q4H PRN Debbrah Alar, PA-C   5 mg at 05/21/21 0857   pantoprazole (PROTONIX) EC tablet 40 mg  40  mg Oral Daily Debbrah Alar, PA-C   40 mg at 05/21/21 0909   pravastatin (PRAVACHOL) tablet 20 mg  20 mg Oral q1800 Debbrah Alar, PA-C   20 mg at 05/20/21 1714   senna-docusate (Senokot-S) tablet 2 tablet  2 tablet Oral QHS Debbrah Alar, PA-C   2 tablet at 05/20/21 2102     Objective: Vital: Vitals:   05/19/21 2100 05/20/21 0500 05/20/21 1115 05/20/21 2143  BP: 137/87 (!) 150/93 (!) 147/93 (!) 128/91  Pulse: 88 81 82 88  Resp: '18 16 16 20  '$ Temp: 98.3 F (36.8 C) 98.3 F (36.8 C) 99.1 F (37.3 C) (!) 97.5 F (36.4 C)  TempSrc: Oral  Oral Oral  SpO2: 97% (!) 68% 100% 96%  Weight:      Height:       I/Os: I/O last 3 completed shifts: In: 2215 [P.O.:1320; I.V.:895] Out: 4440 [Urine:3450; Drains:990]  Physical Exam:  General: Patient is in no apparent distress Lungs: Normal respiratory effort, chest expands symmetrically. GI: Incisions are c/d/i. The abdomen is soft and nontender without mass.  Ileal conduit pink patent and productive of clear yellow urine JP drain with serosanguinous drainage Ext: lower extremities symmetric  Lab Results: Recent Labs    05/19/21 0434 05/20/21 0431 05/21/21 0509  HGB 9.0* 9.3* 9.7*  HCT 26.2* 27.1* 28.4*   Recent Labs  05/19/21 0434 05/20/21 0431 05/21/21 0509  NA 130* 129* 132*  K 4.2 3.8 3.8  CL 98 97* 97*  CO2 '24 27 27  '$ GLUCOSE 126* 112* 104*  BUN 9 7* 6*  CREATININE 0.83 0.80 0.63  CALCIUM 9.2 8.7* 9.0   No results for input(s): LABPT, INR in the last 72 hours. No results for input(s): LABURIN in the last 72 hours. Results for orders placed or performed during the hospital encounter of 05/17/21  Surgical PCR screen     Status: Abnormal   Collection Time: 05/17/21  3:43 PM   Specimen: Nasal Mucosa; Nasal Swab  Result Value Ref Range Status   MRSA, PCR NEGATIVE NEGATIVE Final   Staphylococcus aureus POSITIVE (A) NEGATIVE Final    Comment: (NOTE) The Xpert SA Assay (FDA approved for NASAL specimens in patients  35 years of age and older), is one component of a comprehensive surveillance program. It is not intended to diagnose infection nor to guide or monitor treatment. Performed at [facility], [place] [address], [place], [place] [address]   SARS CORONAVIRUS 2 (TAT 6-24 HRS) Nasopharyngeal Nasopharyngeal Swab     Status: None   Collection Time: 05/17/21  5:10 PM   Specimen: Nasopharyngeal Swab  Result Value Ref Range Status   SARS Coronavirus 2 NEGATIVE NEGATIVE Final    Comment: (NOTE) SARS-CoV-2 target nucleic acids are NOT DETECTED.  The SARS-CoV-2 RNA is generally detectable in upper and lower respiratory specimens during the acute phase of infection. Negative results do not preclude SARS-CoV-2 infection, do not rule out co-infections with other pathogens, and should not be used as the sole basis for treatment or other patient management decisions. Negative results must be combined with clinical observations, patient history, and epidemiological information. The expected result is Negative.  Fact Sheet for Patients: SugarRoll.be  Fact Sheet for Healthcare Providers: https://www.woods-mathews.com/  This test is not yet approved or cleared by the [place] FDA and  has been authorized for detection and/or diagnosis of SARS-CoV-2 by FDA under an Emergency Use Authorization (EUA). This EUA will remain  in effect (meaning this test can be used) for the duration of the COVID-19 declaration under Se ction 564(b)(1) of the Act, 21 U.S.C. section 360bbb-3(b)(1), unless the authorization is terminated or revoked sooner.  Performed at [facility], [place] [address], [place], [place] [address]     Studies/Results: No results found.  Assessment: Muscle invasive bladder cancer  Procedure(s): XI ROBOTIC ASSISTED LAPAROSCOPIC COMPLETE CYSTECTECTOMY, PROSTATECTOMY ILEAL CONDUIT LYMPH NODE DISSECTION CYSTOSCOPY WITH  INJECTION OF INDOCYANINE GREEN DYE, 3 Days Post-Op  doing well.  Plan: Continue light IV fluids of normal saline AM labs Advance diet to general Ambulate, incentive spirometry Lovenox for DVT prophylaxis, SCDs Check JP creatinine   Link Snuffer, MD Urology 05/21/2021, 11:39 AM

## 2021-05-22 LAB — HEMOGLOBIN AND HEMATOCRIT, BLOOD
HCT: 29.2 % — ABNORMAL LOW (ref 39.0–52.0)
Hemoglobin: 9.8 g/dL — ABNORMAL LOW (ref 13.0–17.0)

## 2021-05-22 LAB — BASIC METABOLIC PANEL
Anion gap: 6 (ref 5–15)
BUN: 8 mg/dL (ref 8–23)
CO2: 28 mmol/L (ref 22–32)
Calcium: 8.6 mg/dL — ABNORMAL LOW (ref 8.9–10.3)
Chloride: 96 mmol/L — ABNORMAL LOW (ref 98–111)
Creatinine, Ser: 0.76 mg/dL (ref 0.61–1.24)
GFR, Estimated: >60 mL/min (ref >60)
Glucose, Bld: 101 mg/dL — ABNORMAL HIGH (ref 70–99)
Potassium: 3.7 mmol/L (ref 3.5–5.1)
Sodium: 130 mmol/L — ABNORMAL LOW (ref 135–145)

## 2021-05-22 MED ORDER — POTASSIUM CHLORIDE 20 MEQ PO PACK
20.0000 meq | PACK | Freq: Once | ORAL | Status: AC
  Administered 2021-05-22: 20 meq via ORAL
  Filled 2021-05-22: qty 1

## 2021-05-22 NOTE — Progress Notes (Signed)
Urology Inpatient Progress Report  Bladder cancer ([place]) [C67.9]  Procedure(s): XI ROBOTIC ASSISTED LAPAROSCOPIC COMPLETE CYSTECTECTOMY, PROSTATECTOMY ILEAL CONDUIT LYMPH NODE DISSECTION CYSTOSCOPY WITH INJECTION OF INDOCYANINE GREEN DYE  4 Days Post-Op   Intv/Subj: No acute events overnight. Patient is without complaint.  Lost IV access overnight.  Tolerating general diet but taking it slow.  JP creatinine normal yesterday.  Active Problems:   Bladder cancer ([place])  Current Facility-Administered Medications  Medication Dose Route Frequency Provider Last Rate Last Admin   0.9 %  sodium chloride infusion   Intravenous Continuous [name], MD 50 mL/hr at 05/22/21 0024 New Bag at 05/22/21 0024   acetaminophen (TYLENOL) tablet 1,000 mg  1,000 mg Oral Q6H Reola Mosher, MD   1,000 mg at 05/22/21 0630   alvimopan (ENTEREG) capsule 12 mg  12 mg Oral BID Debbrah Alar, PA-C   12 mg at 05/22/21 0916   diphenhydrAMINE (BENADRYL) injection 12.5 mg  12.5 mg Intravenous Q6H PRN Debbrah Alar, PA-C       Or   diphenhydrAMINE (BENADRYL) 12.5 MG/5ML elixir 12.5 mg  12.5 mg Oral Q6H PRN Dancy, Amanda, PA-C       enoxaparin (LOVENOX) injection 40 mg  40 mg Subcutaneous Q24H Marton Redwood III, MD   40 mg at 05/21/21 1710   famotidine (PEPCID) tablet 20 mg  20 mg Oral Daily PRN Debbrah Alar, PA-C   20 mg at 05/21/21 2121   HYDROmorphone (DILAUDID) injection 0.5-1 mg  0.5-1 mg Intravenous Q2H PRN Debbrah Alar, PA-C   1 mg at 05/19/21 2323   levothyroxine (SYNTHROID) tablet 112 mcg  112 mcg Oral QAC breakfast Debbrah Alar, PA-C   112 mcg at 05/22/21 0630   mupirocin ointment (BACTROBAN) 2 % 1 application  1 application Nasal BID Debbrah Alar, PA-C   1 application at Q000111Q 2122   naphazoline-glycerin (CLEAR EYES REDNESS) ophth solution 1-2 drop  1-2 drop Right Eye QID PRN Reola Mosher, MD   1 drop at 05/19/21 0300   ondansetron (ZOFRAN) injection 4 mg  4 mg Intravenous Q4H PRN Debbrah Alar, PA-C       oxyCODONE (Oxy IR/ROXICODONE) immediate release tablet 5 mg  5 mg Oral Q4H PRN Debbrah Alar, PA-C   5 mg at 05/22/21 0917   pantoprazole (PROTONIX) EC tablet 40 mg  40 mg Oral Daily Debbrah Alar, PA-C   40 mg at 05/22/21 0916   pravastatin (PRAVACHOL) tablet 20 mg  20 mg Oral q1800 Debbrah Alar, PA-C   20 mg at 05/21/21 1710   senna-docusate (Senokot-S) tablet 2 tablet  2 tablet Oral QHS Debbrah Alar, PA-C   2 tablet at 05/21/21 2121     Objective: Vital: Vitals:   05/20/21 2143 05/21/21 1249 05/21/21 2056 05/22/21 0500  BP: (!) 128/91 (!) 141/95 (!) 132/97 (!) 141/93  Pulse: 88 82 (!) 103 85  Resp: '20 18 18 18  '$ Temp: (!) 97.5 F (36.4 C)  98.9 F (37.2 C) 98.2 F (36.8 C)  TempSrc: Oral  Oral Oral  SpO2: 96% 100% 97% 96%  Weight:      Height:       I/Os: I/O last 3 completed shifts: In: 2621.1 [P.O.:720; I.V.:1901.1] Out: [id number] [Urine:3050; Drains:1000]  Physical Exam:  General: Patient is in no apparent distress Lungs: Normal respiratory effort, chest expands symmetrically. GI: Incisions are c/d/i.  The abdomen is soft and nontender without mass.  Ileal conduit pink patent and productive of clear yellow urine JP drain with serosanguinous drainage  Ext: lower extremities symmetric  Lab Results: Recent Labs    05/20/21 0431 05/21/21 0509 05/22/21 0504  HGB 9.3* 9.7* 9.8*  HCT 27.1* 28.4* 29.2*   Recent Labs    05/20/21 0431 05/21/21 0509 05/22/21 0504  NA 129* 132* 130*  K 3.8 3.8 3.7  CL 97* 97* 96*  CO2 '27 27 28  '$ GLUCOSE 112* 104* 101*  BUN 7* 6* 8  CREATININE 0.80 0.63 0.76  CALCIUM 8.7* 9.0 8.6*   No results for input(s): LABPT, INR in the last 72 hours. No results for input(s): LABURIN in the last 72 hours. Results for orders placed or performed during the hospital encounter of 05/17/21  Surgical PCR screen     Status: Abnormal   Collection Time: 05/17/21  3:43 PM   Specimen: Nasal Mucosa; Nasal Swab  Result Value Ref Range  Status   MRSA, PCR NEGATIVE NEGATIVE Final   Staphylococcus aureus POSITIVE (A) NEGATIVE Final    Comment: (NOTE) The Xpert SA Assay (FDA approved for NASAL specimens in patients 16 years of age and older), is one component of a comprehensive surveillance program. It is not intended to diagnose infection nor to guide or monitor treatment. Performed at [facility], [place] [address] [place], [place] [address]   SARS CORONAVIRUS 2 (TAT 6-24 HRS) Nasopharyngeal Nasopharyngeal Swab     Status: None   Collection Time: 05/17/21  5:10 PM   Specimen: Nasopharyngeal Swab  Result Value Ref Range Status   SARS Coronavirus 2 NEGATIVE NEGATIVE Final    Comment: (NOTE) SARS-CoV-2 target nucleic acids are NOT DETECTED.  The SARS-CoV-2 RNA is generally detectable in upper and lower respiratory specimens during the acute phase of infection. Negative results do not preclude SARS-CoV-2 infection, do not rule out co-infections with other pathogens, and should not be used as the sole basis for treatment or other patient management decisions. Negative results must be combined with clinical observations, patient history, and epidemiological information. The expected result is Negative.  Fact Sheet for Patients: SugarRoll.be  Fact Sheet for Healthcare Providers: https://www.woods-mathews.com/  This test is not yet approved or cleared by the [place] FDA and  has been authorized for detection and/or diagnosis of SARS-CoV-2 by FDA under an Emergency Use Authorization (EUA). This EUA will remain  in effect (meaning this test can be used) for the duration of the COVID-19 declaration under Se ction 564(b)(1) of the Act, 21 U.S.C. section 360bbb-3(b)(1), unless the authorization is terminated or revoked sooner.  Performed at [facility], [place] [address] [place], [place] [address]     Studies/Results: No results  found.  Assessment: Muscle invasive bladder cancer  Procedure(s): XI ROBOTIC ASSISTED LAPAROSCOPIC COMPLETE CYSTECTECTOMY, PROSTATECTOMY ILEAL CONDUIT LYMPH NODE DISSECTION CYSTOSCOPY WITH INJECTION OF INDOCYANINE GREEN DYE, 4 Days Post-Op  doing well.  Plan: RN to work on obtaining IV access.  Once obtained continue normal saline at 50 cc/h.  Continue general diet, DVT prophylaxis with Lovenox and SCDs, ambulate, incentive spirometry, morning labs.   Link Snuffer, MD Urology 05/22/2021, 9:20 AM

## 2021-05-22 NOTE — Progress Notes (Signed)
A consult was placed to the IV Therapist to restart the pt's iv site again; apparently pt has had multiple restarts;  pt has visible veins, without a tourniquet;  attempted x 3 and blood returns are obtained, but cannot thread the catheters;  pt has had chemo in the past, and has valves that do not allow the catheters to thread into the veins; he has a vein on the anterior aspect of the Left forearm that is severely sclerosed from a chemo infusion;  pt is taking po, and would like to leave the iv out if possible;  RN aware of no access at present.

## 2021-05-22 NOTE — Plan of Care (Signed)
  Problem: Education: Goal: Knowledge of General Education information will improve Description: Including pain rating scale, medication(s)/side effects and non-pharmacologic comfort measures Outcome: Progressing   Problem: Clinical Measurements: Goal: Will remain free from infection Outcome: Progressing Goal: Diagnostic test results will improve Outcome: Progressing Goal: Respiratory complications will improve Outcome: Progressing   Problem: Activity: Goal: Risk for activity intolerance will decrease Outcome: Progressing   Problem: Nutrition: Goal: Adequate nutrition will be maintained Outcome: Progressing   

## 2021-05-22 NOTE — Plan of Care (Signed)
  Problem: Clinical Measurements: Goal: Ability to maintain clinical measurements within normal limits will improve Outcome: Progressing Goal: Will remain free from infection Outcome: Progressing Goal: Respiratory complications will improve Outcome: Progressing Goal: Cardiovascular complication will be avoided Outcome: Progressing   Problem: Activity: Goal: Risk for activity intolerance will decrease Outcome: Progressing   

## 2021-05-22 NOTE — Progress Notes (Signed)
Mobility Specialist - Cancellation Note     05/22/21 1300  Mobility  Activity Refused mobility   Pt refused mobility at this time. Walked earlier with RN and would like to rest prior to his visitors arriving.   [place] Specialist Acute Rehabilitation Services Phone: [phone] 05/22/21, 1:51 PM

## 2021-05-23 ENCOUNTER — Encounter (HOSPITAL_COMMUNITY): Payer: Self-pay | Admitting: Urology

## 2021-05-23 ENCOUNTER — Inpatient Hospital Stay (HOSPITAL_COMMUNITY): Payer: Medicare Other

## 2021-05-23 DIAGNOSIS — D649 Anemia, unspecified: Secondary | ICD-10-CM | POA: Diagnosis present

## 2021-05-23 DIAGNOSIS — E785 Hyperlipidemia, unspecified: Secondary | ICD-10-CM | POA: Diagnosis present

## 2021-05-23 DIAGNOSIS — E871 Hypo-osmolality and hyponatremia: Secondary | ICD-10-CM

## 2021-05-23 DIAGNOSIS — R319 Hematuria, unspecified: Secondary | ICD-10-CM

## 2021-05-23 DIAGNOSIS — E039 Hypothyroidism, unspecified: Secondary | ICD-10-CM

## 2021-05-23 DIAGNOSIS — R7303 Prediabetes: Secondary | ICD-10-CM

## 2021-05-23 DIAGNOSIS — R55 Syncope and collapse: Secondary | ICD-10-CM | POA: Diagnosis not present

## 2021-05-23 DIAGNOSIS — K219 Gastro-esophageal reflux disease without esophagitis: Secondary | ICD-10-CM

## 2021-05-23 LAB — PROTIME-INR
INR: 0.9 (ref 0.8–1.2)
Prothrombin Time: 12.5 seconds (ref 11.4–15.2)

## 2021-05-23 LAB — COMPREHENSIVE METABOLIC PANEL
ALT: 12 U/L (ref 0–44)
AST: 17 U/L (ref 15–41)
Albumin: 3 g/dL — ABNORMAL LOW (ref 3.5–5.0)
Alkaline Phosphatase: 46 U/L (ref 38–126)
Anion gap: 11 (ref 5–15)
BUN: 9 mg/dL (ref 8–23)
CO2: 22 mmol/L (ref 22–32)
Calcium: 9.2 mg/dL (ref 8.9–10.3)
Chloride: 96 mmol/L — ABNORMAL LOW (ref 98–111)
Creatinine, Ser: 0.87 mg/dL (ref 0.61–1.24)
GFR, Estimated: >60 mL/min (ref >60)
Glucose, Bld: 136 mg/dL — ABNORMAL HIGH (ref 70–99)
Potassium: 4 mmol/L (ref 3.5–5.1)
Sodium: 129 mmol/L — ABNORMAL LOW (ref 135–145)
Total Bilirubin: 0.6 mg/dL (ref 0.3–1.2)
Total Protein: 6.5 g/dL (ref 6.5–8.1)

## 2021-05-23 LAB — MAGNESIUM: Magnesium: 1.5 mg/dL — ABNORMAL LOW (ref 1.7–2.4)

## 2021-05-23 LAB — CBC WITH DIFFERENTIAL/PLATELET
Abs Immature Granulocytes: 0.04 10*3/uL (ref 0.00–0.07)
Basophils Absolute: 0 10*3/uL (ref 0.0–0.1)
Basophils Relative: 0 %
Eosinophils Absolute: 0.2 10*3/uL (ref 0.0–0.5)
Eosinophils Relative: 1 %
HCT: 34.3 % — ABNORMAL LOW (ref 39.0–52.0)
Hemoglobin: 12 g/dL — ABNORMAL LOW (ref 13.0–17.0)
Immature Granulocytes: 0 %
Lymphocytes Relative: 17 %
Lymphs Abs: 2.4 10*3/uL (ref 0.7–4.0)
MCH: 32 pg (ref 26.0–34.0)
MCHC: 35 g/dL (ref 30.0–36.0)
MCV: 91.5 fL (ref 80.0–100.0)
Monocytes Absolute: 1.5 10*3/uL — ABNORMAL HIGH (ref 0.1–1.0)
Monocytes Relative: 10 %
Neutro Abs: 10 10*3/uL — ABNORMAL HIGH (ref 1.7–7.7)
Neutrophils Relative %: 72 %
Platelets: 308 10*3/uL (ref 150–400)
RBC: 3.75 MIL/uL — ABNORMAL LOW (ref 4.22–5.81)
RDW: 14.9 % (ref 11.5–15.5)
WBC: 14.1 10*3/uL — ABNORMAL HIGH (ref 4.0–10.5)
nRBC: 0 % (ref 0.0–0.2)

## 2021-05-23 LAB — TYPE AND SCREEN
ABO/RH(D): O POS
Antibody Screen: NEGATIVE

## 2021-05-23 LAB — HEMOGLOBIN A1C
Hgb A1c MFr Bld: 5 % (ref 4.8–5.6)
Mean Plasma Glucose: 96.8 mg/dL

## 2021-05-23 LAB — GLUCOSE, CAPILLARY
Glucose-Capillary: 131 mg/dL — ABNORMAL HIGH (ref 70–99)
Glucose-Capillary: 143 mg/dL — ABNORMAL HIGH (ref 70–99)
Glucose-Capillary: 116 mg/dL — ABNORMAL HIGH (ref 70–99)

## 2021-05-23 LAB — TROPONIN I (HIGH SENSITIVITY)
Troponin I (High Sensitivity): 6 ng/L (ref <18)
Troponin I (High Sensitivity): 5 ng/L (ref <18)

## 2021-05-23 LAB — ECHOCARDIOGRAM COMPLETE
Area-P 1/2: 5.75 cm2
Height: 72 in
S' Lateral: 2.5 cm
Weight: 2966.51 oz

## 2021-05-23 LAB — LACTIC ACID, PLASMA
Lactic Acid, Venous: 1.5 mmol/L (ref 0.5–1.9)
Lactic Acid, Venous: 1.2 mmol/L (ref 0.5–1.9)

## 2021-05-23 LAB — APTT: aPTT: 36 seconds (ref 24–36)

## 2021-05-23 LAB — MRSA NEXT GEN BY PCR, NASAL: MRSA by PCR Next Gen: NOT DETECTED

## 2021-05-23 MED ORDER — SODIUM CHLORIDE 0.9% FLUSH
3.0000 mL | Freq: Two times a day (BID) | INTRAVENOUS | Status: DC
  Administered 2021-05-23 – 2021-05-27 (×8): 3 mL via INTRAVENOUS

## 2021-05-23 MED ORDER — LACTATED RINGERS IV BOLUS
1000.0000 mL | Freq: Once | INTRAVENOUS | Status: AC
  Administered 2021-05-23: 1000 mL via INTRAVENOUS

## 2021-05-23 MED ORDER — PANTOPRAZOLE SODIUM 40 MG PO TBEC
40.0000 mg | DELAYED_RELEASE_TABLET | Freq: Every day | ORAL | Status: DC
  Administered 2021-05-24 – 2021-05-27 (×4): 40 mg via ORAL
  Filled 2021-05-23 (×4): qty 1

## 2021-05-23 MED ORDER — BISACODYL 10 MG RE SUPP
10.0000 mg | Freq: Once | RECTAL | Status: AC
  Administered 2021-05-23: 10 mg via RECTAL
  Filled 2021-05-23: qty 1

## 2021-05-23 MED ORDER — CHLORHEXIDINE GLUCONATE CLOTH 2 % EX PADS
6.0000 | MEDICATED_PAD | Freq: Every day | CUTANEOUS | Status: DC
  Administered 2021-05-23 – 2021-05-27 (×5): 6 via TOPICAL

## 2021-05-23 MED ORDER — OXYCODONE HCL 5 MG PO TABS
5.0000 mg | ORAL_TABLET | Freq: Four times a day (QID) | ORAL | Status: DC | PRN
Start: 2021-05-23 — End: 2021-05-24
  Administered 2021-05-23 – 2021-05-24 (×5): 5 mg via ORAL
  Filled 2021-05-23 (×6): qty 1

## 2021-05-23 MED ORDER — LACTATED RINGERS IV BOLUS: 1000.0000 mL | Freq: Once | INTRAVENOUS | Status: DC

## 2021-05-23 MED ORDER — PERFLUTREN LIPID MICROSPHERE
1.0000 mL | INTRAVENOUS | Status: AC | PRN
  Administered 2021-05-23: 3 mL via INTRAVENOUS
  Filled 2021-05-23: qty 10

## 2021-05-23 MED ORDER — ENOXAPARIN SODIUM 40 MG/0.4ML IJ SOSY: 40.0000 mg | PREFILLED_SYRINGE | INTRAMUSCULAR | Status: DC

## 2021-05-23 MED ORDER — MAGNESIUM SULFATE 2 GM/50ML IV SOLN
2.0000 g | Freq: Once | INTRAVENOUS | Status: AC
  Administered 2021-05-23: 2 g via INTRAVENOUS
  Filled 2021-05-23: qty 50

## 2021-05-23 NOTE — Progress Notes (Signed)
5 Days Post-Op   Subjective/Chief Complaint:  1 - Bladder Cancer - s/p robotic cystoprostatectomy with pelvic node dissection and conduit diversion on 05/18/21. Path pending. Admitted 9/13 for bowel prep and stomal marking. Has Rt neph tube (currently capped). JP Cr 0. (Same as serum) 9/17.   2 - Peri-OP Ileus - s/p small bowel anastamosis as part of conduit diversion. Received entered pre/post-op. NPO intially with ice chips, advanced to clears POD1, then return of flatus and advanced to regulat by 9/18.  3 - Disposition / Rehab - pt independent at baseline. Ostomy RN team working with pt in house. PT eval 9/15 recs home health PT and rolling walker.   4- Right Malignant Ureteral Obstruction - rt neph tube dependant pre-op due to obstruciton from bladder cancer. Tube capped as part of surgery 9/16.  5 - Vagal Episode - pt with episode of brief loss of concious 9/18 PM. Cr, glucose, TropI normal. Was a bit negative fluid balance and given fluid bolus, stepdown monitoring. NO CP/SOB. No focal weakness.   Today "Gene" is stable. Transferred to stepdown last PM after vagal episode. Was likey a bit dehydrated. No n/v. Still passig gas ut no BM yet.   Objective: Vital signs in last 24 hours: Temp:  [97.7 F (36.5 C)-98.7 F (37.1 C)] 98.7 F (37.1 C) (09/19 0722) Pulse Rate:  [87-103] 99 (09/19 0800) Resp:  [14-18] 17 (09/19 0800) BP: (83-140)/(67-98) 105/71 (09/19 0800) SpO2:  [94 %-98 %] 97 % (09/19 0800) Weight:  [84.1 kg] 84.1 kg (09/19 0319) Last BM Date: 05/18/21  Intake/Output from previous day: 09/18 0701 - 09/19 0700 In: 1783 [P.O.:780; I.V.:3; IV Piggyback:1000] Out: 2175 [Urine:1300; Drains:875] Intake/Output this shift: Total I/O In: -  Out: 250 [Urine:175; Drains:75]  EXAM: NAD, AOx3, sittingup on edge of bed with NSG HR 107, sinus, non-labored breathing on minimal NCO2 Mildabd distensino wo r/g. RLQ urostomy pink/patent with Rt (red) and Lt (blue) bander stents,  recnt surgical sites c/d/I JP with non-fouul serous fluid, Rt neph tube capped. SCD's in place. No c/c/e.   Lab Results:  Recent Labs    05/22/21 0504 05/23/21 0206  WBC  --  14.1*  HGB 9.8* 12.0*  HCT 29.2* 34.3*  PLT  --  308   BMET Recent Labs    05/22/21 0504 05/23/21 0206  NA 130* 129*  K 3.7 4.0  CL 96* 96*  CO2 28 22  GLUCOSE 101* 136*  BUN 8 9  CREATININE 0.76 0.87  CALCIUM 8.6* 9.2   PT/INR Recent Labs    05/23/21 0206  LABPROT 12.5  INR 0.9   ABG No results for input(s): PHART, HCO3 in the last 72 hours.  Invalid input(s): PCO2, PO2  Studies/Results: DG Abd 1 View  Result Date: 05/23/2021 CLINICAL DATA:  Syncope EXAM: ABDOMEN - 1 VIEW COMPARISON:  CT 04/28/2021 FINDINGS: Multiple gas-filled loops of small and large bowel are seen throughout the abdomen most in keeping with an adynamic ileus. There is mottled gas within the right lower quadrant of the abdomen most in keeping with subcutaneous gas within the right lower quadrant abdominal wall. Right percutaneous nephrostomy is unchanged. Interval placement of bilateral retrograde ureteral stents suggest interval creation of a ileal conduit and right lower quadrant urostomy. No gross free intraperitoneal gas. Surgical drain is seen within the low pelvis, partially excluded from the examination. IMPRESSION: Interval surgical changes of probable ileal conduit formation with bilateral retrograde ureteral stents in place. Unchanged right percutaneous nephrostomy. Adynamic ileus.  Electronically Signed   By: Fidela Salisbury M.D.   On: 05/23/2021 03:35   DG CHEST PORT 1 VIEW  Result Date: 05/23/2021 CLINICAL DATA:  Syncope EXAM: PORTABLE CHEST 1 VIEW COMPARISON:  08/13/2012 FINDINGS: The heart size and mediastinal contours are within normal limits. Both lungs are clear. The visualized skeletal structures are unremarkable. IMPRESSION: No active disease. Electronically Signed   By: Fidela Salisbury M.D.   On: 05/23/2021  03:39    Anti-infectives: Anti-infectives (From admission, onward)    Start     Dose/Rate Route Frequency Ordered Stop   05/18/21 0600  piperacillin-tazobactam (ZOSYN) IVPB 3.375 g  Status:  Discontinued        3.375 g 100 mL/hr over 30 Minutes Intravenous 30 min pre-op 05/17/21 0834 05/17/21 1332   05/18/21 0600  piperacillin-tazobactam (ZOSYN) IVPB 3.375 g        3.375 g 100 mL/hr over 30 Minutes Intravenous 30 min pre-op 05/17/21 1332 05/18/21 0920   05/17/21 1600  metroNIDAZOLE (FLAGYL) tablet 500 mg        500 mg Oral Every 4 hours 05/17/21 1355 05/17/21 2350   05/17/21 1600  neomycin (MYCIFRADIN) tablet 500 mg        500 mg Oral Every 4 hours 05/17/21 1355 05/17/21 2350       Assessment/Plan:  Doing acceptabl clinically POD5. Remain stepdown, likely back to med-surg tomorrow as long as no more vagal episodes. Dulcolax SPP today x 1, resume reg diet. Goals for DC discussed.   Appreciate ER MD and hospitalist MD eval of vagal episodes last PM.   [name] 05/23/2021

## 2021-05-23 NOTE — Progress Notes (Signed)
PROGRESS NOTE  Lance Friedman [id number] DOB: 07-Sep-1945 DOA: 05/17/2021 PCP: [name], MD   LOS: 6 days   Brief Narrative / Interim history: 75 year old male with bladder cancer, hypothyroidism, hyperlipidemia, who was admitted on 05/17/2021 on urology service is now status post robotic assisted laparoscopic complete cystectomy, prostatectomy and ileal conduit with lymph node resection.  Overnight 9/18-9/19 CODE BLUE was called on the patient after having a syncopal episode associated with hypotension and hospitalist team was consulted.  It does not appear that he ever lost pulse or stopped breathing.  This was few hours after nursing staff noticed a significant increase in his JP drainage.  Subjective / 24h Interval events: He is feeling okay while laying in bed.  Denies any chest pain, denies any palpitations.  No lightheadedness or dizziness.  He remembers being very lightheaded but does not think he passed out last night  Assessment & Plan: Principal Problem Presyncope, orthostatic hypotension-possibly dehydrated he is net negative couple liters and had increased JP drain in the last 24 hours. -Continue IV fluids, lactic acid normal, troponin negative. -Do not think this was a cardiac event but obtain 2D echo for completeness  Active Problems Hyponatremia-possibly dehydrated, he is hypochloremic as well.  Monitor.  Continue fluids.  Bladder cancer-status post cystectomy, prostatectomy and ileal conduit.  Management per primary, urology.  JP drain management per urology  Normocytic anemia-stable, no bleeding other than mild serosanguineous drainage in the JP drain.  Hypothyroidism-continue Synthroid  Hyperlipidemia-continue statin  Hypomagnesemia-replete    Scheduled Meds:  acetaminophen  1,000 mg Oral Q6H   alvimopan  12 mg Oral BID   bisacodyl  10 mg Rectal Once   Chlorhexidine Gluconate Cloth  6 each Topical Q0600   enoxaparin (LOVENOX) injection  40 mg  Subcutaneous Q24H   levothyroxine  112 mcg Oral QAC breakfast   [START ON 05/24/2021] pantoprazole  40 mg Oral Q0600   pravastatin  20 mg Oral q1800   senna-docusate  2 tablet Oral QHS   sodium chloride flush  3 mL Intravenous Q12H   Continuous Infusions:  sodium chloride 100 mL/hr at 05/23/21 1100   PRN Meds:.diphenhydrAMINE **OR** diphenhydrAMINE, famotidine, HYDROmorphone (DILAUDID) injection, naphazoline-glycerin, ondansetron, oxyCODONE  Diet Orders (From admission, onward)     Start     Ordered   05/23/21 0858  Diet regular Room service appropriate? Yes; Fluid consistency: Thin  Diet effective now       Question Answer Comment  Room service appropriate? Yes   Fluid consistency: Thin      05/23/21 0857            DVT prophylaxis: enoxaparin (LOVENOX) injection 40 mg Start: 05/21/21 1230 SCDs Start: 05/18/21 1622 Place and maintain sequential compression device Start: 05/17/21 1351     Code Status: Full Code  Family Communication: No family at bedside  Status is: Inpatient  Remains inpatient appropriate because:Inpatient level of care appropriate due to severity of illness  Dispo: The patient is from: Home              Anticipated d/c is to: Home              Patient currently is not medically stable to d/c.   Difficult to place patient No   Level of care: Stepdown  Procedures:  2D echo: pending  Microbiology  none  Antimicrobials: none    Objective: Vitals:   05/23/21 0843 05/23/21 0846 05/23/21 0849 05/23/21 0855  BP: 120/84 133/83 (!) 88/53 101/69  Pulse: 98 (!) 108 (!) 108 (!) 103  Resp: '16 19 15 17  '$ Temp:      TempSrc:      SpO2: 96% 96% 96%   Weight:      Height:        Intake/Output Summary (Last 24 hours) at 05/23/2021 1123 Last data filed at 05/23/2021 1100 Gross per 24 hour  Intake 2796.69 ml  Output 2180 ml  Net 616.69 ml   Filed Weights   05/17/21 1330 05/23/21 0319  Weight: 83.1 kg 84.1 kg     Examination:  Constitutional: NAD Eyes: no scleral icterus ENMT: Mucous membranes are moist.  Neck: normal, supple Respiratory: clear to auscultation bilaterally, no wheezing, no crackles. Normal respiratory effort. No accessory muscle use.  Cardiovascular: Regular rate and rhythm, no murmurs / rubs / gallops. No LE edema. Good peripheral pulses Abdomen: non distended, no tenderness. Bowel sounds positive.  Musculoskeletal: no clubbing / cyanosis.  Skin: no rashes Neurologic: CN 2-12 grossly intact. Strength 5/5 in all 4.   Data Reviewed: I have independently reviewed following labs and imaging studies  CBC: Recent Labs  Lab 05/17/21 1416 05/18/21 1357 05/19/21 0434 05/20/21 0431 05/21/21 0509 05/22/21 0504 05/23/21 0206  WBC 6.1  --   --   --   --   --  14.1*  NEUTROABS  --   --   --   --   --   --  10.0*  HGB 10.8*   < > 9.0* 9.3* 9.7* 9.8* 12.0*  HCT 31.5*   < > 26.2* 27.1* 28.4* 29.2* 34.3*  MCV 93.5  --   --   --   --   --  91.5  PLT 240  --   --   --   --   --  308   < > = values in this interval not displayed.   Basic Metabolic Panel: Recent Labs  Lab 05/19/21 0434 05/20/21 0431 05/21/21 0509 05/22/21 0504 05/23/21 0206 05/23/21 0506  NA 130* 129* 132* 130* 129*  --   K 4.2 3.8 3.8 3.7 4.0  --   CL 98 97* 97* 96* 96*  --   CO2 '24 27 27 28 22  '$ --   GLUCOSE 126* 112* 104* 101* 136*  --   BUN 9 7* 6* 8 9  --   CREATININE 0.83 0.80 0.63 0.76 0.87  --   CALCIUM 9.2 8.7* 9.0 8.6* 9.2  --   MG  --   --   --   --   --  1.5*   Liver Function Tests: Recent Labs  Lab 05/17/21 1416 05/23/21 0206  AST 21 17  ALT 20 12  ALKPHOS 54 46  BILITOT 0.5 0.6  PROT 7.8 6.5  ALBUMIN 4.3 3.0*   Coagulation Profile: Recent Labs  Lab 05/23/21 0206  INR 0.9   HbA1C: Recent Labs    05/23/21 0506  HGBA1C 5.0   CBG: Recent Labs  Lab 05/21/21 1131 05/23/21 0151 05/23/21 0611  GLUCAP 116* 131* 143*    Recent Results (from the past 240 hour(s))   Surgical PCR screen     Status: Abnormal   Collection Time: 05/17/21  3:43 PM   Specimen: Nasal Mucosa; Nasal Swab  Result Value Ref Range Status   MRSA, PCR NEGATIVE NEGATIVE Final   Staphylococcus aureus POSITIVE (A) NEGATIVE Final    Comment: (NOTE) The Xpert SA Assay (FDA approved for NASAL specimens in patients 75 years of age and  older), is one component of a comprehensive surveillance program. It is not intended to diagnose infection nor to guide or monitor treatment. Performed at [facility], [place] [address] [place], [place] [address]   SARS CORONAVIRUS 2 (TAT 6-24 HRS) Nasopharyngeal Nasopharyngeal Swab     Status: None   Collection Time: 05/17/21  5:10 PM   Specimen: Nasopharyngeal Swab  Result Value Ref Range Status   SARS Coronavirus 2 NEGATIVE NEGATIVE Final    Comment: (NOTE) SARS-CoV-2 target nucleic acids are NOT DETECTED.  The SARS-CoV-2 RNA is generally detectable in upper and lower respiratory specimens during the acute phase of infection. Negative results do not preclude SARS-CoV-2 infection, do not rule out co-infections with other pathogens, and should not be used as the sole basis for treatment or other patient management decisions. Negative results must be combined with clinical observations, patient history, and epidemiological information. The expected result is Negative.  Fact Sheet for Patients: SugarRoll.be  Fact Sheet for Healthcare Providers: https://www.woods-mathews.com/  This test is not yet approved or cleared by the [place] FDA and  has been authorized for detection and/or diagnosis of SARS-CoV-2 by FDA under an Emergency Use Authorization (EUA). This EUA will remain  in effect (meaning this test can be used) for the duration of the COVID-19 declaration under Se ction 564(b)(1) of the Act, 21 U.S.C. section 360bbb-3(b)(1), unless the authorization is terminated or revoked  sooner.  Performed at [facility], [place] [address] [place], [place] [address]   MRSA Next Gen by PCR, Nasal     Status: None   Collection Time: 05/23/21  3:29 AM   Specimen: Nasal Mucosa; Nasal Swab  Result Value Ref Range Status   MRSA by PCR Next Gen NOT DETECTED NOT DETECTED Final    Comment: (NOTE) The GeneXpert MRSA Assay (FDA approved for NASAL specimens only), is one component of a comprehensive MRSA colonization surveillance program. It is not intended to diagnose MRSA infection nor to guide or monitor treatment for MRSA infections. Test performance is not FDA approved in patients less than 31 years old. Performed at [facility], [place] [address], [place], [place] [address]      Radiology Studies: DG Abd 1 View  Result Date: 05/23/2021 CLINICAL DATA:  Syncope EXAM: ABDOMEN - 1 VIEW COMPARISON:  CT 04/28/2021 FINDINGS: Multiple gas-filled loops of small and large bowel are seen throughout the abdomen most in keeping with an adynamic ileus. There is mottled gas within the right lower quadrant of the abdomen most in keeping with subcutaneous gas within the right lower quadrant abdominal wall. Right percutaneous nephrostomy is unchanged. Interval placement of bilateral retrograde ureteral stents suggest interval creation of a ileal conduit and right lower quadrant urostomy. No gross free intraperitoneal gas. Surgical drain is seen within the low pelvis, partially excluded from the examination. IMPRESSION: Interval surgical changes of probable ileal conduit formation with bilateral retrograde ureteral stents in place. Unchanged right percutaneous nephrostomy. Adynamic ileus. Electronically Signed   By: Fidela Salisbury M.D.   On: 05/23/2021 03:35   DG CHEST PORT 1 VIEW  Result Date: 05/23/2021 CLINICAL DATA:  Syncope EXAM: PORTABLE CHEST 1 VIEW COMPARISON:  08/13/2012 FINDINGS: The heart size and mediastinal contours are within normal limits. Both lungs are  clear. The visualized skeletal structures are unremarkable. IMPRESSION: No active disease. Electronically Signed   By: Fidela Salisbury M.D.   On: 05/23/2021 03:39     Marzetta Board, MD, PhD [facility]  Between 7 am - 7 pm I am available, please contact me via Amion (for emergencies) or Securechat (non urgent messages)  Between 7 pm - 7 am I am not available, please contact night coverage MD/APP via Amion

## 2021-05-23 NOTE — Progress Notes (Signed)
[facility]  Answering Service was notified that the patient had a syncopal episode. The answering service will notify the MD/PCP on call and call back shortly.

## 2021-05-23 NOTE — Progress Notes (Signed)
[place] admitting physician addendum:  The patient's abdominal x-ray showed and adynamic ileus.  The patient was placed preemptively NPO.  We will hold oral oxycodone.  Tennis Must, MD.

## 2021-05-23 NOTE — Progress Notes (Addendum)
2014 Changed wet dressing around JP site and emptied JP, got 140 mL out (serosanguinous fluid) 2135 Checked JP drain, emptied 90 mL out, dressing dry and intact. Pain medication given at that time 2329 emptied JP drain 6m  0142- RN Checked on patient as it was time for pain meds if the patient needed them, Pt c/o feeling sweaty and hot despite not running a temp.  Pt stated he wanted to sit up in the chair.  RN drained his JP again 1475mout-RN was going to call [facility] once patient was in chair to share concerns about how much was coming out of the JP drain  RN got patient up to chain one assist.  Pt sat in chair, once in chair patients color drained and he became diaphoretic and initially mildy unresponsive.  RN stepped out of room to get another RN to call the rapid response RN, seconds later pt was unresponsive to verbal and sternal stimuli at that time RN pulled the code blue button.  CBG 131, BP 83/67 MAP 73 HR 87 Temp 97.9,  DaTennis Mustospitalist stayed at bedside ordered labs, chest and abdominal x rays and 1L LR bolus.  Rapid response RN attempted IV access with no luck so CRNA was called to establish IV access. [facility] notified of events, Spoke with JoIrine Seal Dr D. OrOlevia Bowenspoke with urology and the decision was made to transfer the patient to step down Bedside report was given to [place] attempted to call [facility] [phone] first listed emergency contact, to inform her of the patients transfer to step down Room 1229

## 2021-05-23 NOTE — Progress Notes (Signed)
Patient up to [facility] for bowel movement.  During transfer back to bed, patient became acutely diaphoretic.  Patient did not have syncope and was able to transfer from [facility] to bed.  Blood pressure taken when patient back in bed, 131/85 (98), Heart Rate 95.

## 2021-05-23 NOTE — TOC Initial Note (Signed)
Transition of Care [facility]) - Initial/Assessment Note    Patient Details  Name: Lance Friedman MRN: [id number] Date of Birth: 1946/08/28  Transition of Care [facility]) CM/SW Contact:    Leeroy Cha, RN Phone Number: 05/23/2021, 8:25 AM  Clinical Narrative:                 75 y.o. male osteoarthritis, bladder CA, chronic neck pain, gastritis, GERD, gout, headache, hematuria, hyperlipidemia, hypothyroidism, prediabetes, vitamin D deficiency who was recently admitted and underwent robotic assisted laparoscopic complete cystectomy prostatectomy ileal conduit with lymph node resection 4 days ago which we are seeing after responding to a code call due to a syncopal event.  He denied chest pain, dyspnea, palpitations, but has been lightheaded.  He is not tachycardic but is hypotensive with most recent blood pressures being in the 80s over 60s.  No nausea or vomiting, no diarrhea.  However, the nursing staff stated that they had noticed the the JP drain has increased his output significantly since earlier in the day shift.  He has not been febrile.  No headache, rhinorrhea, sore throat, wheezing or hemoptysis.  transferred down to sdu on pm of [id number] due to syncopal episode. TOC PLAN OF CARE: Mg level is 1.5 rec'ing 2gm mgso4 iv x1, Iv ns at 100cc/hr, na -129, wbc 14.1 Following to return to home with self care, following for progression.  Expected Discharge Plan: Home/Self Care Barriers to Discharge: Continued Medical Work up   Patient Goals and CMS Choice Patient states their goals for this hospitalization and ongoing recovery are:: to go back home CMS Medicare.gov Compare Post Acute Care list provided to:: Patient Choice offered to / list presented to : Patient  Expected Discharge Plan and Services Expected Discharge Plan: Home/Self Care   Discharge Planning Services: CM Consult   Living arrangements for the past 2 months: Apartment                                      Prior  Living Arrangements/Services Living arrangements for the past 2 months: Apartment Lives with:: Self Patient language and need for interpreter reviewed:: Yes Do you feel safe going back to the place where you live?: Yes            Criminal Activity/Legal Involvement Pertinent to Current Situation/Hospitalization: No - Comment as needed  Activities of Daily Living Home Assistive Devices/Equipment: Dentures (specify type), Eyeglasses, Other (Comment) (upper/lower dentures, foley catheter) ADL Screening (condition at time of admission) Patient's cognitive ability adequate to safely complete daily activities?: Yes Is the patient deaf or have difficulty hearing?: No Does the patient have difficulty seeing, even when wearing glasses/contacts?: No Does the patient have difficulty concentrating, remembering, or making decisions?: No Patient able to express need for assistance with ADLs?: Yes Does the patient have difficulty dressing or bathing?: No Independently performs ADLs?: Yes (appropriate for developmental age) Does the patient have difficulty walking or climbing stairs?: No Weakness of Legs: None Weakness of Arms/Hands: None  Permission Sought/Granted                  Emotional Assessment Appearance:: Appears stated age     Orientation: : Oriented to Self, Oriented to Place, Oriented to  Time, Oriented to Situation Alcohol / Substance Use: Not Applicable    Admission diagnosis:  Bladder cancer [facility]) [C67.9] Patient Active Problem List   Diagnosis Date Noted  Hypothyroidism    Hyperlipemia    Pre-diabetes    GERD (gastroesophageal reflux disease)    Hematuria    Hyponatremia    Normocytic anemia    Syncope and collapse    Goals of care, counseling/discussion 01/11/2021   Malignant neoplasm of overlapping sites of bladder ([place]) 01/11/2021   Bladder cancer ([place]) 12/21/2020   PCP:  [name], MD Pharmacy:   [id number] [address], [place] [place] [address] Phone: [phone] Fax: [place], [place] [address] Phone: [phone] Fax: [phone]     Social Determinants of Health (SDOH) Interventions    Readmission Risk Interventions No flowsheet data found.

## 2021-05-23 NOTE — Progress Notes (Signed)
  Echocardiogram 2D Echocardiogram has been performed.  Lance Friedman 05/23/2021, 12:06 PM

## 2021-05-23 NOTE — Progress Notes (Signed)
Orthostatic Vital Signs:   Lying: 120/84, pulse 98 Sitting: 133/83, pulse 108 Standing at 0 mins: 88/53, pulse 108 patient felt too weak and lightheaded, pt unable to remain standing for 3 minutes.  Patient sat in chair and vital signs taken:   BP 101/69, pulse 103

## 2021-05-23 NOTE — Progress Notes (Signed)
PT Cancellation Note  Patient Details Name: CHARIS CUOZZO MRN: [id number] DOB: May 28, 1946   Cancelled Treatment:    Reason Eval/Treat Not Completed: Medical issues which prohibited therapy, syncopal episode this AM. Will check back another time.   Claretha Cooper 05/23/2021, 8:05 AM [place] Pager [phone] Office [phone]

## 2021-05-23 NOTE — Significant Event (Signed)
Rapid Response Event Note   Reason for Call :  Code blue  Initial Focused Assessment:  When arrived, patient was alert and oriented with a pulse and breathing, sitting up in chair. Patient apparently experienced a syncopal episode. BP found to be to low and no IV site. Patient diaphoretic and pale. MD at bedside.   Interventions:  Assisted patient from chair to bed, vitals taken, chest dried from sweat, EKG done. Attempted IV placement x 3. CRNA called to bedside. Started IV fluids as soon as IV access obtained.   Plan of Care:  X-rays ordered by MD. Transfer to SD unit. Labs done.   Event Summary:   MD Notified: 0215 Call Time: 0215 Arrival Time: 0220 End Time: 0303 (When patient transferred to room 1229.)  Selinda Michaels, RN

## 2021-05-23 NOTE — Progress Notes (Signed)
Patient was in distress. Nurse called a Code Blue. When this writer(CN tonight) entered the room, the patient looked as if he had a near syncopal episode. Pt. was diaphoretic. The patient was also very pale.  The patient was in the chair and staff transferred him to the bed,the patient became a little more responsive at that point. The code team came to the patient's room (Rm 1419). The MD covering for [facility] assumed care for the interim. The vital signs were checked and a 12 lead EKG was done. The blood pressure was soft, patient was hypotensive.  Peripheral IV was attempted but unsuccessful. The MD asked for the CRNA to come start an IV. The Operating Room was notified. The CRNA came to the room to start a peripheral IV.

## 2021-05-23 NOTE — Consult Note (Signed)
Medical Consultation   [name]  [id number]  DOB: Sep 25, 1945  DOA: 05/17/2021  PCP: [name], MD   Requesting physician: [name], MD  Reason for consultation: Syncope and collapse.  History of Present Illness: [name] is an 75 y.o. male osteoarthritis, bladder CA, chronic neck pain, gastritis, GERD, gout, headache, hematuria, hyperlipidemia, hypothyroidism, prediabetes, vitamin D deficiency who was recently admitted and underwent robotic assisted laparoscopic complete cystectomy prostatectomy ileal conduit with lymph node resection 4 days ago which we are seeing after responding to a code call due to a syncopal event.  He denied chest pain, dyspnea, palpitations, but has been lightheaded.  He is not tachycardic but is hypotensive with most recent blood pressures being in the 80s over 60s.  No nausea or vomiting, no diarrhea.  However, the nursing staff stated that they had noticed the the JP drain has increased his output significantly since earlier in the day shift.  He has not been febrile.  No headache, rhinorrhea, sore throat, wheezing or hemoptysis.  Review of Systems:  ROS As per HPI otherwise 10 point review of systems negative.   Past Medical History: Past Medical History:  Diagnosis Date   Arthritis    oa   Bladder cancer ([place])    Chronic neck pain    Gastritis    GERD (gastroesophageal reflux disease)    Gout    last flare up fall 2021   Headache    Hematuria    started again 12-13-2020   Hyperlipemia    Hypothyroidism    Pre-diabetes    Vitamin D deficiency    Wears dentures    full set   Wears glasses    Past Surgical History: Past Surgical History:  Procedure Laterality Date   COLONOSCOPY WITH PROPOFOL N/A 03/21/2016   Procedure: COLONOSCOPY WITH PROPOFOL;  Surgeon: Garlan Fair, MD;  Location: [facility];  Service: Endoscopy;  Laterality: N/A;   CYSTOSCOPY W/ RETROGRADES Bilateral 12/21/2020   Procedure:  CYSTOSCOPY WITH RETROGRADE PYELOGRAM;  Surgeon: [name], MD;  Location: [facility];  Service: Urology;  Laterality: Bilateral;   CYSTOSCOPY WITH INJECTION N/A 05/18/2021   Procedure: CYSTOSCOPY WITH INJECTION OF INDOCYANINE GREEN DYE;  Surgeon: [name], MD;  Location: [facility];  Service: Urology;  Laterality: N/A;   ingrown toenail surgery  yrs ago   IR NEPHROSTOMY EXCHANGE RIGHT  03/04/2021   IR NEPHROSTOMY PLACEMENT RIGHT  12/28/2020   IR PATIENT EVAL TECH 0-60 MINS  03/08/2021   IR PATIENT EVAL TECH 0-60 MINS  05/05/2021   LYMPH NODE DISSECTION Bilateral 05/18/2021   Procedure: LYMPH NODE DISSECTION;  Surgeon: [name], MD;  Location: [facility];  Service: Urology;  Laterality: Bilateral;   MOUTH SURGERY  2015   tooth extraction   ROBOT ASSISTED LAPAROSCOPIC COMPLETE CYSTECT ILEAL CONDUIT N/A 05/18/2021   Procedure: XI ROBOTIC ASSISTED LAPAROSCOPIC COMPLETE CYSTECTECTOMY, PROSTATECTOMY ILEAL CONDUIT;  Surgeon: [name], MD;  Location: [facility];  Service: Urology;  Laterality: N/A;  6 HRS   TONSILLECTOMY  as child   TRANSURETHRAL RESECTION OF BLADDER TUMOR N/A 12/21/2020   Procedure: TRANSURETHRAL RESECTION OF BLADDER TUMOR (TURBT);  Surgeon: [name], MD;  Location: [facility];  Service: Urology;  Laterality: N/A;  45 MINS   Allergies: Allergies  Allergen Reactions   Aleve [Naproxen] Other (See Comments)     Cannot take Naproxen sodium  due to past stomach damage    Aspirin Other (See Comments)    History of stomach bleeds   Motrin [Ibuprofen] Other (See Comments)    History of stomach bleeds   Shellfish-Derived Products Swelling and Other (See Comments)    Caused joint swelling   Social History:  reports that he quit smoking about 37 years ago. His smoking use included cigarettes. He has never used smokeless tobacco. He reports current drug use. Drug: Marijuana. He reports that he does not drink alcohol.  Family  History: History reviewed. No pertinent family history.  Physical Exam: Vitals:   05/22/21 2058 05/23/21 0159 05/23/21 0220 05/23/21 0251  BP: (!) 120/91 (!) 83/67 (!) 84/68 102/75  Pulse: 98 87 89 96  Resp: 18 18    Temp: 98.3 F (36.8 C) 97.7 F (36.5 C)    TempSrc: Oral Oral    SpO2: 97% 97% 95% 94%  Weight:      Height:       Constitutional: Alert and awake, oriented x3, diaphoretic initially. Eyes: PERLA, EOMI, irises appear normal, anicteric sclera,  ENMT: external ears and nose appear normal, normal hearing or hard of hearing            Lips appears normal, oropharynx mucosa, tongue, posterior pharynx appear normal  Neck: neck appears normal, no masses, normal ROM, no thyromegaly, no JVD  CVS: S1-S2 clear, no murmur rubs or gallops, no LE edema, normal pedal pulses  Respiratory:  clear to auscultation bilaterally, no wheezing, rales or rhonchi. Respiratory effort normal. No accessory muscle use.  Abdomen: No distention.  Surgical incisions present.  Ileal conduit present, JP drain with serosanguineous liquid discharge.  Bowel sounds positive.  Soft nontender. Musculoskeletal: : Moderate generalized weakness.  No cyanosis, clubbing or edema noted bilaterally              Neuro: Cranial nerves II-XII intact, strength, sensation, reflexes Psych: judgement and insight appear normal, stable mood and affect, mental status Skin: no rashes or lesions or ulcers, no induration or nodules   Data reviewed:  I have personally reviewed following labs and imaging studies Labs:  CBC: Recent Labs  Lab 05/17/21 1416 05/18/21 1357 05/19/21 0434 05/20/21 0431 05/21/21 0509 05/22/21 0504 05/23/21 0206  WBC 6.1  --   --   --   --   --  14.1*  NEUTROABS  --   --   --   --   --   --  10.0*  HGB 10.8*   < > 9.0* 9.3* 9.7* 9.8* 12.0*  HCT 31.5*   < > 26.2* 27.1* 28.4* 29.2* 34.3*  MCV 93.5  --   --   --   --   --  91.5  PLT 240  --   --   --   --   --  308   < > = values in this interval  not displayed.   Basic Metabolic Panel: Recent Labs  Lab 05/17/21 1416 05/19/21 0434 05/20/21 0431 05/21/21 0509 05/22/21 0504  NA 136 130* 129* 132* 130*  K 4.0 4.2 3.8 3.8 3.7  CL 100 98 97* 97* 96*  CO2 '28 24 27 27 28  '$ GLUCOSE 93 126* 112* 104* 101*  BUN 10 9 7* 6* 8  CREATININE 0.68 0.83 0.80 0.63 0.76  CALCIUM 9.8 9.2 8.7* 9.0 8.6*   GFR Estimated Creatinine Clearance: 90.3 mL/min (by C-G formula based on SCr of 0.76 mg/dL). Liver Function Tests: Recent Labs  Lab 05/17/21  1416  AST 21  ALT 20  ALKPHOS 54  BILITOT 0.5  PROT 7.8  ALBUMIN 4.3   No results for input(s): LIPASE, AMYLASE in the last 168 hours. No results for input(s): AMMONIA in the last 168 hours. Coagulation profile No results for input(s): INR, PROTIME in the last 168 hours.  Cardiac Enzymes: No results for input(s): CKTOTAL, CKMB, CKMBINDEX, TROPONINI in the last 168 hours. BNP: Invalid input(s): POCBNP CBG: Recent Labs  Lab 05/21/21 1131 05/23/21 0151  GLUCAP 116* 131*   D-Dimer No results for input(s): DDIMER in the last 72 hours. Hgb A1c No results for input(s): HGBA1C in the last 72 hours. Lipid Profile No results for input(s): CHOL, HDL, LDLCALC, TRIG, CHOLHDL, LDLDIRECT in the last 72 hours. Thyroid function studies No results for input(s): TSH, T4TOTAL, T3FREE, THYROIDAB in the last 72 hours.  Invalid input(s): FREET3 Anemia work up No results for input(s): VITAMINB12, FOLATE, FERRITIN, TIBC, IRON, RETICCTPCT in the last 72 hours.  Sepsis Labs Invalid input(s): PROCALCITONIN,  WBC,  LACTICIDVEN Microbiology Recent Results (from the past 240 hour(s))  Surgical PCR screen     Status: Abnormal   Collection Time: 05/17/21  3:43 PM   Specimen: Nasal Mucosa; Nasal Swab  Result Value Ref Range Status   MRSA, PCR NEGATIVE NEGATIVE Final   Staphylococcus aureus POSITIVE (A) NEGATIVE Final    Comment: (NOTE) The Xpert SA Assay (FDA approved for NASAL specimens in patients  37 years of age and older), is one component of a comprehensive surveillance program. It is not intended to diagnose infection nor to guide or monitor treatment. Performed at [facility], [place] [address] [place], [place] [address]   SARS CORONAVIRUS 2 (TAT 6-24 HRS) Nasopharyngeal Nasopharyngeal Swab     Status: None   Collection Time: 05/17/21  5:10 PM   Specimen: Nasopharyngeal Swab  Result Value Ref Range Status   SARS Coronavirus 2 NEGATIVE NEGATIVE Final    Comment: (NOTE) SARS-CoV-2 target nucleic acids are NOT DETECTED.  The SARS-CoV-2 RNA is generally detectable in upper and lower respiratory specimens during the acute phase of infection. Negative results do not preclude SARS-CoV-2 infection, do not rule out co-infections with other pathogens, and should not be used as the sole basis for treatment or other patient management decisions. Negative results must be combined with clinical observations, patient history, and epidemiological information. The expected result is Negative.  Fact Sheet for Patients: SugarRoll.be  Fact Sheet for Healthcare Providers: https://www.woods-mathews.com/  This test is not yet approved or cleared by the [place] FDA and  has been authorized for detection and/or diagnosis of SARS-CoV-2 by FDA under an Emergency Use Authorization (EUA). This EUA will remain  in effect (meaning this test can be used) for the duration of the COVID-19 declaration under Se ction 564(b)(1) of the Act, 21 U.S.C. section 360bbb-3(b)(1), unless the authorization is terminated or revoked sooner.  Performed at [facility], [place] [address], [place], [place] [address]    Inpatient Medications:   Scheduled Meds:  acetaminophen  1,000 mg Oral Q6H   alvimopan  12 mg Oral BID   enoxaparin (LOVENOX) injection  40 mg Subcutaneous Q24H   levothyroxine  112 mcg Oral QAC breakfast   pantoprazole  40 mg  Oral Daily   pravastatin  20 mg Oral q1800   senna-docusate  2 tablet Oral QHS   sodium chloride flush  3 mL Intravenous Q12H   Continuous Infusions:  sodium chloride Stopped (05/22/21 1930)  lactated ringers     Radiological Exams on Admission: No results found.  Impression/Recommendations Principal Problem:   Syncope and collapse Secondary to volume depletion. Discussed with neurology on-call. Will transfer to the stepdown unit. Continue fluid resuscitation. Maintenance IV fluids after IV boluses. Monitor intake and output. Follow-up troponin level. Follow-up echocardiogram.  Active Problems:   Chronic hyponatremia Monitor sodium level.    Bladder cancer [facility]) Status post cystectomy. Postop care per urology.    Hematuria on JP drain   Normocytic anemia Monitor hematocrit and hemoglobin. Transfuse as needed.    Hypothyroidism Continue levothyroxine 112 mcg p.o. daily.    Hyperlipemia Continue pravastatin 20 mg p.o. daily.    Pre-diabetes Carbohydrate modified diet. CBG monitoring AC and HS Check hemoglobin A1c.    GERD (gastroesophageal reflux disease) Continue famotidine as needed.  Thank you for this consultation.  Our [facility] hospitalist team will follow the patient with you.  Time Spent: About 70 minutes of critical care time were spent during the consultation of this emergent event.  Reubin Milan M.D. [facility] 05/23/2021, 2:53 AM  This document was prepared using Dragon voice recognition software and may contain some unintended transcription errors.

## 2021-05-24 DIAGNOSIS — D649 Anemia, unspecified: Secondary | ICD-10-CM

## 2021-05-24 DIAGNOSIS — E039 Hypothyroidism, unspecified: Secondary | ICD-10-CM

## 2021-05-24 DIAGNOSIS — C679 Malignant neoplasm of bladder, unspecified: Principal | ICD-10-CM

## 2021-05-24 DIAGNOSIS — R7303 Prediabetes: Secondary | ICD-10-CM

## 2021-05-24 LAB — COMPREHENSIVE METABOLIC PANEL
ALT: 15 U/L (ref 0–44)
AST: 22 U/L (ref 15–41)
Albumin: 2.6 g/dL — ABNORMAL LOW (ref 3.5–5.0)
Alkaline Phosphatase: 40 U/L (ref 38–126)
Anion gap: 9 (ref 5–15)
BUN: 14 mg/dL (ref 8–23)
CO2: 24 mmol/L (ref 22–32)
Calcium: 8.6 mg/dL — ABNORMAL LOW (ref 8.9–10.3)
Chloride: 95 mmol/L — ABNORMAL LOW (ref 98–111)
Creatinine, Ser: 0.78 mg/dL (ref 0.61–1.24)
GFR, Estimated: >60 mL/min (ref >60)
Glucose, Bld: 116 mg/dL — ABNORMAL HIGH (ref 70–99)
Potassium: 4.3 mmol/L (ref 3.5–5.1)
Sodium: 128 mmol/L — ABNORMAL LOW (ref 135–145)
Total Bilirubin: 0.6 mg/dL (ref 0.3–1.2)
Total Protein: 5.8 g/dL — ABNORMAL LOW (ref 6.5–8.1)

## 2021-05-24 LAB — SURGICAL PATHOLOGY

## 2021-05-24 LAB — TSH: TSH: 6.671 u[IU]/mL — ABNORMAL HIGH (ref 0.350–4.500)

## 2021-05-24 LAB — GLUCOSE, CAPILLARY: Glucose-Capillary: 117 mg/dL — ABNORMAL HIGH (ref 70–99)

## 2021-05-24 LAB — CBC
HCT: 34.3 % — ABNORMAL LOW (ref 39.0–52.0)
Hemoglobin: 11.8 g/dL — ABNORMAL LOW (ref 13.0–17.0)
MCH: 31.8 pg (ref 26.0–34.0)
MCHC: 34.4 g/dL (ref 30.0–36.0)
MCV: 92.5 fL (ref 80.0–100.0)
Platelets: 222 10*3/uL (ref 150–400)
RBC: 3.71 MIL/uL — ABNORMAL LOW (ref 4.22–5.81)
RDW: 14.9 % (ref 11.5–15.5)
WBC: 9.3 10*3/uL (ref 4.0–10.5)
nRBC: 0 % (ref 0.0–0.2)

## 2021-05-24 LAB — MAGNESIUM: Magnesium: 1.7 mg/dL (ref 1.7–2.4)

## 2021-05-24 MED ORDER — MAGNESIUM SULFATE 2 GM/50ML IV SOLN
2.0000 g | Freq: Once | INTRAVENOUS | Status: AC
  Administered 2021-05-24: 2 g via INTRAVENOUS
  Filled 2021-05-24: qty 50

## 2021-05-24 MED ORDER — OXYCODONE HCL 5 MG PO TABS
5.0000 mg | ORAL_TABLET | ORAL | Status: DC | PRN
Start: 2021-05-24 — End: 2021-05-27
  Administered 2021-05-24 – 2021-05-27 (×11): 5 mg via ORAL
  Filled 2021-05-24 (×11): qty 1

## 2021-05-24 MED ORDER — DEXTROSE 5 % IN LACTATED RINGERS IV BOLUS
500.0000 mL | Freq: Once | INTRAVENOUS | Status: AC
  Administered 2021-05-24: 500 mL via INTRAVENOUS

## 2021-05-24 MED ORDER — ADULT MULTIVITAMIN W/MINERALS CH
1.0000 | ORAL_TABLET | Freq: Every day | ORAL | Status: DC
  Administered 2021-05-24 – 2021-05-27 (×4): 1 via ORAL
  Filled 2021-05-24 (×4): qty 1

## 2021-05-24 MED ORDER — METOCLOPRAMIDE HCL 5 MG/ML IJ SOLN
5.0000 mg | Freq: Three times a day (TID) | INTRAMUSCULAR | Status: DC
  Administered 2021-05-24 – 2021-05-27 (×10): 5 mg via INTRAVENOUS
  Filled 2021-05-24 (×10): qty 2

## 2021-05-24 MED ORDER — VITAMIN E 180 MG (400 UNIT) PO CAPS
800.0000 [IU] | ORAL_CAPSULE | Freq: Every day | ORAL | Status: DC
  Administered 2021-05-24 – 2021-05-27 (×4): 800 [IU] via ORAL
  Filled 2021-05-24 (×4): qty 2

## 2021-05-24 MED ORDER — VITAMIN E 670 MG (1000 UT) PO CAPS: 1000.0000 [IU] | ORAL_CAPSULE | Freq: Every day | ORAL | Status: DC

## 2021-05-24 NOTE — Progress Notes (Signed)
PROGRESS NOTE  Lance Friedman TKP:[id number] DOB: [birth year] DOA: 05/17/2021 PCP: [name], MD   LOS: 7 days   Brief Narrative / Interim history: 75 year old male with bladder cancer, hypothyroidism, hyperlipidemia, who was admitted on 05/17/2021 on urology service is now status post robotic assisted laparoscopic complete cystectomy, prostatectomy and ileal conduit with lymph node resection.  Overnight 9/18-9/19 CODE BLUE was called on the patient after having a syncopal episode associated with hypotension and hospitalist team was consulted.  It does not appear that he ever lost pulse or stopped breathing.  This was few hours after nursing staff noticed a significant increase in his JP drainage.  Subjective / 24h Interval events: No significant complaints this morning.  Last time he tried to get up was last night and became lightheaded but did not pass out.  Assessment & Plan: Principal Problem Presyncope, orthostatic hypotension-likely dehydrated, in and out shows that he is still 1.5 L negative balance, symptomatically lightheaded upon standing last night -Continue IV fluids, placed TED hoses, he is normal/hypertensive when laying flat but drops when he stands up -Do not think his syncope couple of nights ago was a cardiac event.  2D echo was obtained for completeness which showed EF 60-65%, normal LVEF, RV was normal as well  Active Problems Hyponatremia-possibly dehydrated, he is hypochloremic as well.  Stable overall, continue fluids  Bladder cancer-status post cystectomy, prostatectomy and ileal conduit.  Management per primary, urology.  JP drain management per urology  Normocytic anemia-stable, no bleeding other than mild serosanguineous drainage in the JP drain.  Hypothyroidism-continue Synthroid  Hyperlipidemia-continue statin  Hypomagnesemia-replete    Scheduled Meds:  acetaminophen  1,000 mg Oral Q6H   alvimopan  12 mg Oral BID   Chlorhexidine Gluconate Cloth  6  each Topical Q0600   enoxaparin (LOVENOX) injection  40 mg Subcutaneous Q24H   levothyroxine  112 mcg Oral QAC breakfast   metoCLOPramide (REGLAN) injection  5 mg Intravenous Q8H   multivitamin with minerals  1 tablet Oral Daily   pantoprazole  40 mg Oral Q0600   pravastatin  20 mg Oral q1800   senna-docusate  2 tablet Oral QHS   sodium chloride flush  3 mL Intravenous Q12H   Vitamin E  800 Units Oral Daily   Continuous Infusions:  sodium chloride 50 mL/hr at 05/24/21 0820   PRN Meds:.diphenhydrAMINE **OR** diphenhydrAMINE, famotidine, HYDROmorphone (DILAUDID) injection, naphazoline-glycerin, ondansetron, oxyCODONE  Diet Orders (From admission, onward)     Start     Ordered   05/23/21 0858  Diet regular Room service appropriate? Yes; Fluid consistency: Thin  Diet effective now       Question Answer Comment  Room service appropriate? Yes   Fluid consistency: Thin      05/23/21 0857            DVT prophylaxis: Place TED hose Start: 05/24/21 0826 enoxaparin (LOVENOX) injection 40 mg Start: 05/21/21 1230 SCDs Start: 05/18/21 1622 Place and maintain sequential compression device Start: 05/17/21 1351     Code Status: Full Code  Family Communication: No family at bedside  Status is: Inpatient  Remains inpatient appropriate because:Inpatient level of care appropriate due to severity of illness  Dispo: The patient is from: Home              Anticipated d/c is to: Home              Patient currently is not medically stable to d/c.   Difficult to place patient No  Level of care: Stepdown  Procedures:  2D echo: pending  Microbiology  none  Antimicrobials: none    Objective: Vitals:   05/24/21 0945 05/24/21 0950 05/24/21 0955 05/24/21 1000  BP: 125/76 126/88  (!) 140/104  Pulse: (!) 121 (!) 119 (!) 113 (!) 114  Resp: 13 15 19 17   Temp:      TempSrc:      SpO2: 98% 100% 96% 98%  Weight:      Height:        Intake/Output Summary (Last 24 hours) at  05/24/2021 1009 Last data filed at 05/24/2021 0820 Gross per 24 hour  Intake 2184.69 ml  Output 1515 ml  Net 669.69 ml    Filed Weights   05/17/21 1330 05/23/21 0319 05/24/21 0400  Weight: 83.1 kg 84.1 kg 87.1 kg    Examination:  Constitutional: Is in no distress, in bed Eyes: Anicteric ENMT: Moist mucous membranes Neck: normal, supple Respiratory: CTA bilaterally, no wheezing, normal respiratory effort Cardiovascular: Regular rate and rhythm, no murmurs, no edema Abdomen: Soft, NT, ND, bowel sounds positive Musculoskeletal: no clubbing / cyanosis.  Skin: No rashes seen Neurologic: Nonfocal  Data Reviewed: I have independently reviewed following labs and imaging studies  CBC: Recent Labs  Lab 05/17/21 1416 05/18/21 1357 05/20/21 0431 05/21/21 0509 05/22/21 0504 05/23/21 0206 05/24/21 0240  WBC 6.1  --   --   --   --  14.1* 9.3  NEUTROABS  --   --   --   --   --  10.0*  --   HGB 10.8*   < > 9.3* 9.7* 9.8* 12.0* 11.8*  HCT 31.5*   < > 27.1* 28.4* 29.2* 34.3* 34.3*  MCV 93.5  --   --   --   --  91.5 92.5  PLT 240  --   --   --   --  308 222   < > = values in this interval not displayed.    Basic Metabolic Panel: Recent Labs  Lab 05/20/21 0431 05/21/21 0509 05/22/21 0504 05/23/21 0206 05/23/21 0506 05/24/21 0240  NA 129* 132* 130* 129*  --  128*  K 3.8 3.8 3.7 4.0  --  4.3  CL 97* 97* 96* 96*  --  95*  CO2 27 27 28 22   --  24  GLUCOSE 112* 104* 101* 136*  --  116*  BUN 7* 6* 8 9  --  14  CREATININE 0.80 0.63 0.76 0.87  --  0.78  CALCIUM 8.7* 9.0 8.6* 9.2  --  8.6*  MG  --   --   --   --  1.5* 1.7    Liver Function Tests: Recent Labs  Lab 05/17/21 1416 05/23/21 0206 05/24/21 0240  AST 21 17 22   ALT 20 12 15   ALKPHOS 54 46 40  BILITOT 0.5 0.6 0.6  PROT 7.8 6.5 5.8*  ALBUMIN 4.3 3.0* 2.6*    Coagulation Profile: Recent Labs  Lab 05/23/21 0206  INR 0.9    HbA1C: Recent Labs    05/23/21 0506  HGBA1C 5.0    CBG: Recent Labs  Lab  05/21/21 1131 05/23/21 0151 05/23/21 0611 05/23/21 1249 05/24/21 0545  GLUCAP 116* 131* 143* 116* 117*     Recent Results (from the past 240 hour(s))  Surgical PCR screen     Status: Abnormal   Collection Time: 05/17/21  3:43 PM   Specimen: Nasal Mucosa; Nasal Swab  Result Value Ref Range Status   MRSA, PCR  NEGATIVE NEGATIVE Final   Staphylococcus aureus POSITIVE (A) NEGATIVE Final    Comment: (NOTE) The Xpert SA Assay (FDA approved for NASAL specimens in patients 26 years of age and older), is one component of a comprehensive surveillance program. It is not intended to diagnose infection nor to guide or monitor treatment. Performed at [facility] [place] [address] [place], [place] [address]   SARS CORONAVIRUS 2 (TAT 6-24 HRS) Nasopharyngeal Nasopharyngeal Swab     Status: None   Collection Time: 05/17/21  5:10 PM   Specimen: Nasopharyngeal Swab  Result Value Ref Range Status   SARS Coronavirus 2 NEGATIVE NEGATIVE Final    Comment: (NOTE) SARS-CoV-2 target nucleic acids are NOT DETECTED.  The SARS-CoV-2 RNA is generally detectable in upper and lower respiratory specimens during the acute phase of infection. Negative results do not preclude SARS-CoV-2 infection, do not rule out co-infections with other pathogens, and should not be used as the sole basis for treatment or other patient management decisions. Negative results must be combined with clinical observations, patient history, and epidemiological information. The expected result is Negative.  Fact Sheet for Patients: SugarRoll.be  Fact Sheet for Healthcare Providers: https://www.woods-mathews.com/  This test is not yet approved or cleared by the [place] FDA and  has been authorized for detection and/or diagnosis of SARS-CoV-2 by FDA under an Emergency Use Authorization (EUA). This EUA will remain  in effect (meaning this test can be used) for the  duration of the COVID-19 declaration under Se ction 564(b)(1) of the Act, 21 U.S.C. section 360bbb-3(b)(1), unless the authorization is terminated or revoked sooner.  Performed at [facility], [place] [address] [place], [place] [address]   MRSA Next Gen by PCR, Nasal     Status: None   Collection Time: 05/23/21  3:29 AM   Specimen: Nasal Mucosa; Nasal Swab  Result Value Ref Range Status   MRSA by PCR Next Gen NOT DETECTED NOT DETECTED Final    Comment: (NOTE) The GeneXpert MRSA Assay (FDA approved for NASAL specimens only), is one component of a comprehensive MRSA colonization surveillance program. It is not intended to diagnose MRSA infection nor to guide or monitor treatment for MRSA infections. Test performance is not FDA approved in patients less than 92 years old. Performed at [facility], [place] [address], [place], [place] [address]       Radiology Studies: ECHOCARDIOGRAM COMPLETE  Result Date: 05/23/2021    ECHOCARDIOGRAM REPORT   Patient Name:   Lance Friedman Date of Exam: 05/23/2021 Medical Rec #:  [id number]       Height:       72.0 in Accession #:    [id number]      Weight:       185.4 lb Date of Birth:  15-Feb-1946       BSA:          2.063 m Patient Age:    57 years        BP:           136/84 mmHg Patient Gender: M               HR:           93 bpm. Exam Location:  Inpatient Procedure: 2D Echo, Color Doppler, Cardiac Doppler and Intracardiac            Opacification Agent Indications:    R55 Syncope  History:        Patient  has no prior history of Echocardiogram examinations.                 Risk Factors:Dyslipidemia.  Sonographer:    Bernadene Person RDCS Referring Phys: [id number] [place]  Sonographer Comments: Technically difficult study due to poor echo windows. IMPRESSIONS  1. Poor acoustic windows limit study.  2. Left ventricular ejection fraction, by estimation, is 60 to 65%. The left ventricle has normal function. Left ventricular  diastolic function could not be evaluated.  3. Right ventricular systolic function is normal. The right ventricular size is normal.  4. The mitral valve was not well visualized. Trivial mitral valve regurgitation.  5. The aortic valve was not well visualized. Aortic valve regurgitation is not visualized. No aortic stenosis is present.  6. Aortic dilatation noted. There is mild dilatation of the aortic root, measuring 41 mm. There is mild dilatation of the ascending aorta, measuring 42 mm. FINDINGS  Left Ventricle: Extremely poor acoustic windows limit study Overall LVEF is normal with no definite wall motion abnormalities. Left ventricular ejection fraction, by estimation, is 60 to 65%. The left ventricle has normal function. Definity contrast agent was given IV to delineate the left ventricular endocardial borders. The left ventricular internal cavity size was normal in size. There is no left ventricular hypertrophy. Left ventricular diastolic function could not be evaluated. Right Ventricle: The right ventricular size is normal. Right ventricular systolic function is normal. Left Atrium: Left atrial size was normal in size. Right Atrium: Right atrial size was normal in size. Mitral Valve: The mitral valve was not well visualized. There is mild thickening of the mitral valve leaflet(s). Trivial mitral valve regurgitation. Tricuspid Valve: The tricuspid valve is not well visualized. Aortic Valve: The aortic valve was not well visualized. Aortic valve regurgitation is not visualized. No aortic stenosis is present. Pulmonic Valve: The pulmonic valve was not well visualized. Pulmonic valve regurgitation is not visualized. Aorta: Aortic dilatation noted. There is mild dilatation of the aortic root, measuring 41 mm. There is mild dilatation of the ascending aorta, measuring 42 mm. IAS/Shunts: No atrial level shunt detected by color flow Doppler.  LEFT VENTRICLE PLAX 2D LVIDd:         3.90 cm  Diastology LVIDs:          2.50 cm  LV e' medial:   4.35 cm/s LV PW:         1.10 cm  LV E/e' medial: 9.3 LV IVS:        1.10 cm LVOT diam:     2.30 cm LVOT Area:     4.15 cm  LEFT ATRIUM         Index LA diam:    2.60 cm 1.26 cm/m   AORTA Ao Root diam: 4.10 cm Ao Asc diam:  4.20 cm MITRAL VALVE MV Area (PHT): 5.75 cm    SHUNTS MV Decel Time: 132 msec    Systemic Diam: 2.30 cm MV E velocity: 40.40 cm/s MV A velocity: 48.30 cm/s MV E/A ratio:  0.84 Dorris Carnes MD Electronically signed by Dorris Carnes MD Signature Date/Time: 05/23/2021/12:46:22 PM    Final      Marzetta Board, MD, PhD [facility]  Between 7 am - 7 pm I am available, please contact me via Amion (for emergencies) or Securechat (non urgent messages)  Between 7 pm - 7 am I am not available, please contact night coverage MD/APP via Amion

## 2021-05-24 NOTE — Plan of Care (Signed)

## 2021-05-24 NOTE — Progress Notes (Signed)
Physical Therapy Treatment Patient Details Name: Lance Friedman MRN: [id number] DOB: 1946/02/05 Today's Date: 05/24/2021   History of Present Illness Pt is 75 yo male with muscle invasive bladder CA/bladder diverticulum and is s/p cystoscopy and laparoscopic radical cystectomy with prostatectomy, bil pelvic lymphadenectomy, and ileal conduit urinary diversion on 05/18/21. Pt with hx including arthritis, bladder CA, chronic neck pain, gout, HLD    PT Comments    Pt seen in ICU.  OOB in recliner via nursing.  Granddaughter and her BF visiting.  Pt feeling "better".  Assisted with amb in hallway a great distance.  General Gait Details: + 2 assist for safety and equipment such that recliner was following.  Pt tolerated distance well.   Recommendations for follow up therapy are one component of a multi-disciplinary discharge planning process, led by the attending physician.  Recommendations may be updated based on patient status, additional functional criteria and insurance authorization.  Follow Up Recommendations  Home health PT;Supervision - Intermittent     Equipment Recommendations  Rolling walker with 5" wheels    Recommendations for Other Services       Precautions / Restrictions Precautions Precautions: Fall Precaution Comments: urostomy, JP Drain     Mobility  Bed Mobility               General bed mobility comments: OOB in recliner    Transfers Overall transfer level: Needs assistance Equipment used: Rolling walker (2 wheeled) Transfers: Sit to/from Stand Sit to Stand: Min guard;+2 safety/equipment         General transfer comment: 50% VC's to push of from recliner vs pull up on walker  Ambulation/Gait Ambulation/Gait assistance: Min guard;+2 physical assistance Gait Distance (Feet): 250 Feet Assistive device: Rolling walker (2 wheeled) Gait Pattern/deviations: Step-to pattern;Decreased stride length Gait velocity: decreased   General Gait Details: + 2  assist for safety and equipment such that recliner was following.  Pt tolerated distance well.   Stairs             Wheelchair Mobility    Modified Rankin (Stroke Patients Only)       Balance                                            Cognition Arousal/Alertness: Awake/alert Behavior During Therapy: WFL for tasks assessed/performed Overall Cognitive Status: Within Functional Limits for tasks assessed                                 General Comments: AxO x 3 very pleasant and motivated      Exercises      General Comments        Pertinent Vitals/Pain Pain Assessment: 0-10 Pain Score: 7  Pain Location: surgical site, ABD Pain Descriptors / Indicators: Discomfort;Aching;Operative site guarding Pain Intervention(s): Monitored during session;Repositioned;Premedicated before session    Home Living                      Prior Function            PT Goals (current goals can now be found in the care plan section) Progress towards PT goals: Progressing toward goals    Frequency    Min 3X/week      PT Plan Current plan remains appropriate    Co-evaluation  AM-PAC PT "6 Clicks" Mobility   Outcome Measure  Help needed turning from your back to your side while in a flat bed without using bedrails?: A Little Help needed moving from lying on your back to sitting on the side of a flat bed without using bedrails?: A Little Help needed moving to and from a bed to a chair (including a wheelchair)?: A Little Help needed standing up from a chair using your arms (e.g., wheelchair or bedside chair)?: A Little Help needed to walk in hospital room?: A Little Help needed climbing 3-5 steps with a railing? : A Lot 6 Click Score: 17    End of Session Equipment Utilized During Treatment: Gait belt Activity Tolerance: Patient tolerated treatment well Patient left: in chair;with call bell/phone within reach;with  family/visitor present Nurse Communication: Mobility status PT Visit Diagnosis: Other abnormalities of gait and mobility (R26.89);Muscle weakness (generalized) (M62.81)     Time: 9774-1423 PT Time Calculation (min) (ACUTE ONLY): 12 min  Charges:  $Gait Training: 8-22 mins                     Rica Koyanagi  PTA Acute  Rehabilitation Services Pager      [phone] Office      [phone]

## 2021-05-24 NOTE — Progress Notes (Signed)
While assessing prthostatic BP, patient's telemetry alarmed V/fib, on assessment patient denies any diaphoresis, or chest pain.   12 lead EKG showed borderline ECG.   VSS, will continue to monitor.

## 2021-05-24 NOTE — Progress Notes (Signed)
6 Days Post-Op   Subjective/Chief Complaint:   1 - Bladder Cancer - s/p robotic cystoprostatectomy with pelvic node dissection and conduit diversion on 05/18/21. Path pending. Admitted 9/13 for bowel prep and stomal marking. Has Rt neph tube (currently capped). JP Cr 0. (Same as serum) 9/17.   2 - Peri-OP Ileus - s/p small bowel anastamosis as part of conduit diversion. Received entered pre/post-op. NPO intially with ice chips, advanced to clears POD1, then return of flatus and advanced to regular by 9/18. Reglan added 9/20.   3 - Disposition / Rehab - pt independent at baseline. Ostomy RN team working with pt in house. PT eval 9/15 recs home health PT and rolling walker.   4- Right Malignant Ureteral Obstruction - rt neph tube dependant pre-op due to obstruciton from bladder cancer. Tube capped as part of surgery 9/16.  5 - Vagal Episode - pt with episode of brief loss of concious 9/18 PM. Cr, glucose, TropI normal. Was a bit negative fluid balance and given fluid bolus, stepdown monitoring. NO CP/SOB. No focal weakness.   Today "Lance Friedman" is stable. Minimal response to suppository, feels distended c/w partial ileus. NO fevers, No more vagal episodes.   Objective: Vital signs in last 24 hours: Temp:  [97.5 F (36.4 C)-98.3 F (36.8 C)] 97.7 F (36.5 C) (09/20 0400) Pulse Rate:  [94-110] 100 (09/20 0600) Resp:  [12-19] 17 (09/20 0600) BP: (88-160)/(53-106) 160/106 (09/20 0600) SpO2:  [94 %-99 %] 98 % (09/20 0600) Weight:  [87.1 kg] 87.1 kg (09/20 0400) Last BM Date: 05/23/21  Intake/Output from previous day: 09/19 0701 - 09/20 0700 In: 2875 [P.O.:480; I.V.:2345; IV Piggyback:50] Out: 3532 [Urine:905; Drains:800] Intake/Output this shift: No intake/output data recorded.   EXAM: NAD, AOx3.  HR 101, sinus, non-labored breathing on minimal NCO2 Mildabd distensino no r/g. RLQ urostomy pink/patent with Rt (red) and Lt (blue) bander stents, recnt surgical sites c/d/I JP with non-fouul  serous fluid, Rt neph tube capped. SCD's in place. No c/c/e.   Lab Results:  Recent Labs    05/23/21 0206 05/24/21 0240  WBC 14.1* 9.3  HGB 12.0* 11.8*  HCT 34.3* 34.3*  PLT 308 222   BMET Recent Labs    05/23/21 0206 05/24/21 0240  NA 129* 128*  K 4.0 4.3  CL 96* 95*  CO2 22 24  GLUCOSE 136* 116*  BUN 9 14  CREATININE 0.87 0.78  CALCIUM 9.2 8.6*   PT/INR Recent Labs    05/23/21 0206  LABPROT 12.5  INR 0.9   ABG No results for input(s): PHART, HCO3 in the last 72 hours.  Invalid input(s): PCO2, PO2  Studies/Results: DG Abd 1 View  Result Date: 05/23/2021 CLINICAL DATA:  Syncope EXAM: ABDOMEN - 1 VIEW COMPARISON:  CT 04/28/2021 FINDINGS: Multiple gas-filled loops of small and large bowel are seen throughout the abdomen most in keeping with an adynamic ileus. There is mottled gas within the right lower quadrant of the abdomen most in keeping with subcutaneous gas within the right lower quadrant abdominal wall. Right percutaneous nephrostomy is unchanged. Interval placement of bilateral retrograde ureteral stents suggest interval creation of a ileal conduit and right lower quadrant urostomy. No gross free intraperitoneal gas. Surgical drain is seen within the low pelvis, partially excluded from the examination. IMPRESSION: Interval surgical changes of probable ileal conduit formation with bilateral retrograde ureteral stents in place. Unchanged right percutaneous nephrostomy. Adynamic ileus. Electronically Signed   By: Fidela Salisbury M.D.   On: 05/23/2021 03:35  DG CHEST PORT 1 VIEW  Result Date: 05/23/2021 CLINICAL DATA:  Syncope EXAM: PORTABLE CHEST 1 VIEW COMPARISON:  08/13/2012 FINDINGS: The heart size and mediastinal contours are within normal limits. Both lungs are clear. The visualized skeletal structures are unremarkable. IMPRESSION: No active disease. Electronically Signed   By: Fidela Salisbury M.D.   On: 05/23/2021 03:39   ECHOCARDIOGRAM COMPLETE  Result Date:  05/23/2021    ECHOCARDIOGRAM REPORT   Patient Name:   Lance Friedman Date of Exam: 05/23/2021 Medical Rec #:  [id number]       Height:       72.0 in Accession #:    [id number]      Weight:       185.4 lb Date of Birth:  11-Feb-1946       BSA:          2.063 m Patient Age:    5 years        BP:           136/84 mmHg Patient Gender: M               HR:           93 bpm. Exam Location:  Inpatient Procedure: 2D Echo, Color Doppler, Cardiac Doppler and Intracardiac            Opacification Agent Indications:    R55 Syncope  History:        Patient has no prior history of Echocardiogram examinations.                 Risk Factors:Dyslipidemia.  Sonographer:    Bernadene Person RDCS Referring Phys: [id number] [place]  Sonographer Comments: Technically difficult study due to poor echo windows. IMPRESSIONS  1. Poor acoustic windows limit study.  2. Left ventricular ejection fraction, by estimation, is 60 to 65%. The left ventricle has normal function. Left ventricular diastolic function could not be evaluated.  3. Right ventricular systolic function is normal. The right ventricular size is normal.  4. The mitral valve was not well visualized. Trivial mitral valve regurgitation.  5. The aortic valve was not well visualized. Aortic valve regurgitation is not visualized. No aortic stenosis is present.  6. Aortic dilatation noted. There is mild dilatation of the aortic root, measuring 41 mm. There is mild dilatation of the ascending aorta, measuring 42 mm. FINDINGS  Left Ventricle: Extremely poor acoustic windows limit study Overall LVEF is normal with no definite wall motion abnormalities. Left ventricular ejection fraction, by estimation, is 60 to 65%. The left ventricle has normal function. Definity contrast agent was given IV to delineate the left ventricular endocardial borders. The left ventricular internal cavity size was normal in size. There is no left ventricular hypertrophy. Left ventricular diastolic function  could not be evaluated. Right Ventricle: The right ventricular size is normal. Right ventricular systolic function is normal. Left Atrium: Left atrial size was normal in size. Right Atrium: Right atrial size was normal in size. Mitral Valve: The mitral valve was not well visualized. There is mild thickening of the mitral valve leaflet(s). Trivial mitral valve regurgitation. Tricuspid Valve: The tricuspid valve is not well visualized. Aortic Valve: The aortic valve was not well visualized. Aortic valve regurgitation is not visualized. No aortic stenosis is present. Pulmonic Valve: The pulmonic valve was not well visualized. Pulmonic valve regurgitation is not visualized. Aorta: Aortic dilatation noted. There is mild dilatation of the aortic root, measuring 41 mm. There is mild dilatation of the  ascending aorta, measuring 42 mm. IAS/Shunts: No atrial level shunt detected by color flow Doppler.  LEFT VENTRICLE PLAX 2D LVIDd:         3.90 cm  Diastology LVIDs:         2.50 cm  LV e' medial:   4.35 cm/s LV PW:         1.10 cm  LV E/e' medial: 9.3 LV IVS:        1.10 cm LVOT diam:     2.30 cm LVOT Area:     4.15 cm  LEFT ATRIUM         Index LA diam:    2.60 cm 1.26 cm/m   AORTA Ao Root diam: 4.10 cm Ao Asc diam:  4.20 cm MITRAL VALVE MV Area (PHT): 5.75 cm    SHUNTS MV Decel Time: 132 msec    Systemic Diam: 2.30 cm MV E velocity: 40.40 cm/s MV A velocity: 48.30 cm/s MV E/A ratio:  0.84 Dorris Carnes MD Electronically signed by Dorris Carnes MD Signature Date/Time: 05/23/2021/12:46:22 PM    Final     Anti-infectives: Anti-infectives (From admission, onward)    Start     Dose/Rate Route Frequency Ordered Stop   05/18/21 0600  piperacillin-tazobactam (ZOSYN) IVPB 3.375 g  Status:  Discontinued        3.375 g 100 mL/hr over 30 Minutes Intravenous 30 min pre-op 05/17/21 0834 05/17/21 1332   05/18/21 0600  piperacillin-tazobactam (ZOSYN) IVPB 3.375 g        3.375 g 100 mL/hr over 30 Minutes Intravenous 30 min pre-op  05/17/21 1332 05/18/21 0920   05/17/21 1600  metroNIDAZOLE (FLAGYL) tablet 500 mg        500 mg Oral Every 4 hours 05/17/21 1355 05/17/21 2350   05/17/21 1600  neomycin (MYCIFRADIN) tablet 500 mg        500 mg Oral Every 4 hours 05/17/21 1355 05/17/21 2350       Assessment/Plan:  Doing acceptabl clinically POD6. Ilues biggest issue which is typical. Importance of walking discussed. Add Reglan Q8.      [name] 05/24/2021

## 2021-05-24 NOTE — Progress Notes (Signed)
Pharmacy Brief Note - Alvimopan (Entereg)  The standing order set for alvimopan (Entereg) now includes an automatic order to discontinue the drug after the patient has had a bowel movement. The change was approved by the [place] and the Medical Executive Committee.   This patient has had bowel movements documented by nursing. Therefore, alvimopan has been discontinued. If there are questions, please contact the pharmacy at [phone].   Thank you-   Minda Ditto PharmD [facility] [phone] 05/24/2021, 11:00 AM

## 2021-05-24 NOTE — Progress Notes (Signed)
Report called in to [facility], room 1404.

## 2021-05-25 DIAGNOSIS — E871 Hypo-osmolality and hyponatremia: Secondary | ICD-10-CM

## 2021-05-25 DIAGNOSIS — E7800 Pure hypercholesterolemia, unspecified: Secondary | ICD-10-CM | POA: Diagnosis not present

## 2021-05-25 DIAGNOSIS — E032 Hypothyroidism due to medicaments and other exogenous substances: Secondary | ICD-10-CM

## 2021-05-25 DIAGNOSIS — C670 Malignant neoplasm of trigone of bladder: Secondary | ICD-10-CM

## 2021-05-25 DIAGNOSIS — K2100 Gastro-esophageal reflux disease with esophagitis, without bleeding: Secondary | ICD-10-CM | POA: Diagnosis not present

## 2021-05-25 DIAGNOSIS — R55 Syncope and collapse: Secondary | ICD-10-CM | POA: Diagnosis not present

## 2021-05-25 LAB — BASIC METABOLIC PANEL
Anion gap: 9 (ref 5–15)
BUN: 16 mg/dL (ref 8–23)
CO2: 24 mmol/L (ref 22–32)
Calcium: 8 mg/dL — ABNORMAL LOW (ref 8.9–10.3)
Chloride: 89 mmol/L — ABNORMAL LOW (ref 98–111)
Creatinine, Ser: 0.74 mg/dL (ref 0.61–1.24)
GFR, Estimated: >60 mL/min (ref >60)
Glucose, Bld: 100 mg/dL — ABNORMAL HIGH (ref 70–99)
Potassium: 3.8 mmol/L (ref 3.5–5.1)
Sodium: 122 mmol/L — ABNORMAL LOW (ref 135–145)

## 2021-05-25 LAB — CBC
HCT: 30.3 % — ABNORMAL LOW (ref 39.0–52.0)
Hemoglobin: 10.5 g/dL — ABNORMAL LOW (ref 13.0–17.0)
MCH: 31.7 pg (ref 26.0–34.0)
MCHC: 34.7 g/dL (ref 30.0–36.0)
MCV: 91.5 fL (ref 80.0–100.0)
Platelets: 282 10*3/uL (ref 150–400)
RBC: 3.31 MIL/uL — ABNORMAL LOW (ref 4.22–5.81)
RDW: 14.8 % (ref 11.5–15.5)
WBC: 10.5 10*3/uL (ref 4.0–10.5)
nRBC: 0 % (ref 0.0–0.2)

## 2021-05-25 LAB — SODIUM
Sodium: 123 mmol/L — ABNORMAL LOW (ref 135–145)
Sodium: 121 mmol/L — ABNORMAL LOW (ref 135–145)

## 2021-05-25 LAB — OSMOLALITY: Osmolality: 272 mOsm/kg — ABNORMAL LOW (ref 275–295)

## 2021-05-25 LAB — OSMOLALITY, URINE: Osmolality, Ur: 122 mOsm/kg — ABNORMAL LOW (ref 300–900)

## 2021-05-25 LAB — SODIUM, URINE, RANDOM: Sodium, Ur: <10 mmol/L

## 2021-05-25 LAB — FOLATE: Folate: 17.4 ng/mL (ref >5.9)

## 2021-05-25 LAB — CORTISOL: Cortisol, Plasma: 17.9 ug/dL

## 2021-05-25 LAB — VITAMIN B12: Vitamin B-12: 244 pg/mL (ref 180–914)

## 2021-05-25 LAB — MAGNESIUM: Magnesium: 1.7 mg/dL (ref 1.7–2.4)

## 2021-05-25 LAB — GLUCOSE, CAPILLARY: Glucose-Capillary: 104 mg/dL — ABNORMAL HIGH (ref 70–99)

## 2021-05-25 MED ORDER — POLYETHYLENE GLYCOL 3350 17 G PO PACK
17.0000 g | PACK | Freq: Once | ORAL | Status: AC
  Administered 2021-05-25: 17 g via ORAL
  Filled 2021-05-25: qty 1

## 2021-05-25 MED ORDER — BISACODYL 10 MG RE SUPP
10.0000 mg | Freq: Once | RECTAL | Status: AC
  Administered 2021-05-25: 10 mg via RECTAL
  Filled 2021-05-25: qty 1

## 2021-05-25 MED ORDER — SODIUM CHLORIDE 0.9 % IV BOLUS: 1000.0000 mL | Freq: Once | INTRAVENOUS | Status: DC

## 2021-05-25 MED ORDER — SODIUM CHLORIDE 0.9 % IV BOLUS
1000.0000 mL | Freq: Once | INTRAVENOUS | Status: AC
  Administered 2021-05-25: 1000 mL via INTRAVENOUS

## 2021-05-25 NOTE — Progress Notes (Signed)
[facility]                                                                              Patient Demographics  Lance Friedman, is a 75 y.o. male, DOB - Dec 14, 1945, QTM:[id number]  Admit date - 05/17/2021   Admitting Physician [name], MD  Outpatient Primary MD for the patient is [name], MD  Outpatient specialists:   LOS - 8  days   Medical records reviewed and are as summarized below:    No chief complaint on file.      Brief summary   Patient is a 75 year old male with history of bladder CA, hypothyroidism, hyperlipidemia admitted on 05/17/2021 on urology service, now status post roboticassisted laparoscopic complete cystectomy, prostatectomy and ileal conduit with lymph node resection.  Overnight 9/18-9/19 CODE BLUE was called on the patient after having a syncopal episode associated with hypotension and hospitalist team was consulted.  It does not appear that he ever lost pulse or stopped breathing.  This was few hours after nursing staff noticed a significant increase in his JP drainage   Assessment & Plan    Principal Problem:   Syncope and collapse -Likely due to orthostatic hypotension from dehydration, still negative balance of 1.9 L -Continue IV fluids, TED hoses, repeat orthostatic vitals, check cortisol level, B12/folate.  TSH 6.6 -Needs PT OT -2D echo showed normal EF 60 to 60%, RV normal  Active Problems: Acute hyponatremia -Likely secondary to #1, dehydration, worsening to 122 today -Obtain serum osmolality, urine osmolality, UNA -Will give fluid bolus and recheck sodium     Bladder cancer (HCC) - status post cystectomy, prostatectomy and ileal conduit.   - Management per primary, urology.   - JP drain management per urology     Hypothyroidism - TSH 6.6, continue Synthroid   Normocytic anemia -H&H currently stable, no overt bleeding  Hyperlipidemia  -Continue statin  Generalized debility -Continue PT OT  evaluation  Constipation -Abdomen somewhat distended, states had a suppository with little BM 3 days ago and since then no BM. -Continue Senokot, added MiraLAX x1, Dulcolax suppository x1   Code Status: Full CODE STATUS DVT Prophylaxis:  Place TED hose Start: 05/24/21 0826 enoxaparin (LOVENOX) injection 40 mg Start: 05/21/21 1230 SCDs Start: 05/18/21 1622 Place and maintain sequential compression device Start: 05/17/21 1351   Level of Care: Level of care: Telemetry Family Communication: Discussed all imaging results, lab results, explained to the patient    Disposition Plan:     Status is: Inpatient  Remains inpatient appropriate because:Inpatient level of care appropriate due to severity of illness  Dispo: The patient is from: Home              Anticipated d/c is to: Home              Patient currently is not medically stable to d/c.  Sodium very low, work-up in progress   Difficult to place patient No      Time Spent in minutes 35 minutes   Antimicrobials:   Anti-infectives (From admission, onward)    Start     Dose/Rate Route Frequency Ordered Stop  05/18/21 0600  piperacillin-tazobactam (ZOSYN) IVPB 3.375 g  Status:  Discontinued        3.375 g 100 mL/hr over 30 Minutes Intravenous 30 min pre-op 05/17/21 0834 05/17/21 1332   05/18/21 0600  piperacillin-tazobactam (ZOSYN) IVPB 3.375 g        3.375 g 100 mL/hr over 30 Minutes Intravenous 30 min pre-op 05/17/21 1332 05/18/21 0920   05/17/21 1600  metroNIDAZOLE (FLAGYL) tablet 500 mg        500 mg Oral Every 4 hours 05/17/21 1355 05/17/21 2350   05/17/21 1600  neomycin (MYCIFRADIN) tablet 500 mg        500 mg Oral Every 4 hours 05/17/21 1355 05/17/21 2350          Medications  Scheduled Meds:  acetaminophen  1,000 mg Oral Q6H   Chlorhexidine Gluconate Cloth  6 each Topical Q0600   enoxaparin (LOVENOX) injection  40 mg Subcutaneous Q24H   levothyroxine  112 mcg Oral QAC breakfast   metoCLOPramide  (REGLAN) injection  5 mg Intravenous Q8H   multivitamin with minerals  1 tablet Oral Daily   pantoprazole  40 mg Oral Q0600   pravastatin  20 mg Oral q1800   senna-docusate  2 tablet Oral QHS   sodium chloride flush  3 mL Intravenous Q12H   Vitamin E  800 Units Oral Daily   Continuous Infusions:  sodium chloride 50 mL/hr at 05/25/21 1140   PRN Meds:.diphenhydrAMINE **OR** diphenhydrAMINE, famotidine, HYDROmorphone (DILAUDID) injection, naphazoline-glycerin, ondansetron, oxyCODONE      Subjective:   Lance Friedman was seen and examined today.  No acute complaints, no nausea or vomiting.  No diarrhea, no BM in last 2 days. Patient denies dizziness, chest pain, shortness of breath, abdominal pain. No acute events overnight.  Feels overall weak.  Objective:   Vitals:   05/25/21 0015 05/25/21 0401 05/25/21 0500 05/25/21 1133  BP: (!) 133/91 131/85  131/89  Pulse: 97 94  92  Resp: 18 16  16   Temp: (!) 97.4 F (36.3 C) 97.6 F (36.4 C)  98.3 F (36.8 C)  TempSrc: Oral     SpO2: 99% 99%  97%  Weight:   91.8 kg   Height:        Intake/Output Summary (Last 24 hours) at 05/25/2021 1420 Last data filed at 05/25/2021 1244 Gross per 24 hour  Intake 240 ml  Output 1095 ml  Net -855 ml     Wt Readings from Last 3 Encounters:  05/25/21 91.8 kg  04/29/21 84.1 kg  03/31/21 85.3 kg     Exam General: Alert and oriented x 3, NAD Cardiovascular: S1 S2 auscultated, no murmurs, RRR Respiratory: Clear to auscultation bilaterally, no wheezing Gastrointestinal: Soft, nontender, + somewhat distended, hypoactive BS Ext: no pedal edema bilaterally Neuro: no new FND's Psych: Normal affect and demeanor, alert and oriented x3    Data Reviewed:  I have personally reviewed following labs and imaging studies  Micro Results Recent Results (from the past 240 hour(s))  Surgical PCR screen     Status: Abnormal   Collection Time: 05/17/21  3:43 PM   Specimen: Nasal Mucosa; Nasal Swab   Result Value Ref Range Status   MRSA, PCR NEGATIVE NEGATIVE Final   Staphylococcus aureus POSITIVE (A) NEGATIVE Final    Comment: (NOTE) The Xpert SA Assay (FDA approved for NASAL specimens in patients 33 years of age and older), is one component of a comprehensive surveillance program. It is not intended to diagnose infection nor  to guide or monitor treatment. Performed at [facility], [place] [address] [place], [place] [address]   SARS CORONAVIRUS 2 (TAT 6-24 HRS) Nasopharyngeal Nasopharyngeal Swab     Status: None   Collection Time: 05/17/21  5:10 PM   Specimen: Nasopharyngeal Swab  Result Value Ref Range Status   SARS Coronavirus 2 NEGATIVE NEGATIVE Final    Comment: (NOTE) SARS-CoV-2 target nucleic acids are NOT DETECTED.  The SARS-CoV-2 RNA is generally detectable in upper and lower respiratory specimens during the acute phase of infection. Negative results do not preclude SARS-CoV-2 infection, do not rule out co-infections with other pathogens, and should not be used as the sole basis for treatment or other patient management decisions. Negative results must be combined with clinical observations, patient history, and epidemiological information. The expected result is Negative.  Fact Sheet for Patients: SugarRoll.be  Fact Sheet for Healthcare Providers: https://www.woods-mathews.com/  This test is not yet approved or cleared by the [place] FDA and  has been authorized for detection and/or diagnosis of SARS-CoV-2 by FDA under an Emergency Use Authorization (EUA). This EUA will remain  in effect (meaning this test can be used) for the duration of the COVID-19 declaration under Se ction 564(b)(1) of the Act, 21 U.S.C. section 360bbb-3(b)(1), unless the authorization is terminated or revoked sooner.  Performed at [facility], [place] [address], [place], [place] [address]   MRSA Next Gen by PCR,  Nasal     Status: None   Collection Time: 05/23/21  3:29 AM   Specimen: Nasal Mucosa; Nasal Swab  Result Value Ref Range Status   MRSA by PCR Next Gen NOT DETECTED NOT DETECTED Final    Comment: (NOTE) The GeneXpert MRSA Assay (FDA approved for NASAL specimens only), is one component of a comprehensive MRSA colonization surveillance program. It is not intended to diagnose MRSA infection nor to guide or monitor treatment for MRSA infections. Test performance is not FDA approved in patients less than 70 years old. Performed at [facility], [place] [address], [place], [place] [address]     Radiology Reports DG Abd 1 View  Result Date: 05/23/2021 CLINICAL DATA:  Syncope EXAM: ABDOMEN - 1 VIEW COMPARISON:  CT 04/28/2021 FINDINGS: Multiple gas-filled loops of small and large bowel are seen throughout the abdomen most in keeping with an adynamic ileus. There is mottled gas within the right lower quadrant of the abdomen most in keeping with subcutaneous gas within the right lower quadrant abdominal wall. Right percutaneous nephrostomy is unchanged. Interval placement of bilateral retrograde ureteral stents suggest interval creation of a ileal conduit and right lower quadrant urostomy. No gross free intraperitoneal gas. Surgical drain is seen within the low pelvis, partially excluded from the examination. IMPRESSION: Interval surgical changes of probable ileal conduit formation with bilateral retrograde ureteral stents in place. Unchanged right percutaneous nephrostomy. Adynamic ileus. Electronically Signed   By: Fidela Salisbury M.D.   On: 05/23/2021 03:35   CT CHEST ABDOMEN PELVIS W CONTRAST  Result Date: 04/29/2021 CLINICAL DATA:  Bladder cancer.  Restaging. EXAM: CT CHEST, ABDOMEN, AND PELVIS WITH CONTRAST TECHNIQUE: Multidetector CT imaging of the chest, abdomen and pelvis was performed following the standard protocol during bolus administration of intravenous contrast. CONTRAST:   15mL OMNIPAQUE IOHEXOL 350 MG/ML SOLN COMPARISON:  11/25/2020 FINDINGS: CT CHEST FINDINGS Cardiovascular: The heart size is normal. No substantial pericardial effusion. Coronary artery calcification is evident. Mitral annular calcification noted. Mild atherosclerotic calcification is noted in the wall  of the thoracic aorta. Mediastinum/Nodes: No mediastinal lymphadenopathy. There is no hilar lymphadenopathy. The esophagus has normal imaging features. There is no axillary lymphadenopathy. Lungs/Pleura: Architectural distortion/scarring noted anterior right upper lobe. 12 mm nodule identified deep posterior right costophrenic sulcus on image 132/4. This is in an area of compressive atelectasis and may be atelectatic. No suspicious pulmonary nodule or mass. No focal airspace consolidation. No pleural effusion. Musculoskeletal: No worrisome lytic or sclerotic osseous abnormality. Degenerative changes noted both shoulders. CT ABDOMEN PELVIS FINDINGS Hepatobiliary: No suspicious focal abnormality within the liver parenchyma. Layering tiny calcified gallstones evident. No intrahepatic or extrahepatic biliary dilation. Pancreas: No focal mass lesion. No dilatation of the main duct. No intraparenchymal cyst. No peripancreatic edema. Spleen: No splenomegaly. No focal mass lesion. Adrenals/Urinary Tract: No adrenal nodule or mass. Right percutaneous nephrostomy tube noted with tip formed in the region of the central renal pelvis. No hydronephrosis. Left kidney unremarkable. No evidence for hydroureter. Bladder is decompressed with apparent diffuse bladder wall thickening. The right-sided enhancing bladder mass seen on the previous study is not well demonstrated today although a right-sided bladder diverticulum persists, smaller than before. Stomach/Bowel: Stomach is unremarkable. No gastric wall thickening. No evidence of outlet obstruction. Duodenum is normally positioned as is the ligament of Treitz. No small bowel wall  thickening. No small bowel dilatation. The terminal ileum is normal. The appendix is normal. No gross colonic mass. No colonic wall thickening. Vascular/Lymphatic: There is mild atherosclerotic calcification of the abdominal aorta without aneurysm. There is no gastrohepatic or hepatoduodenal ligament lymphadenopathy. No retroperitoneal or mesenteric lymphadenopathy. No pelvic sidewall lymphadenopathy. Both common iliac arteries are increased diameter measuring up to 2.0 cm on the right, stable. Reproductive: Prostate gland is enlarged. Other: No intraperitoneal free fluid. Musculoskeletal: No worrisome lytic or sclerotic osseous abnormality. IMPRESSION: 1. The right-sided enhancing bladder mass seen on the previous study is not well demonstrated today although a right-sided bladder diverticulum persists, smaller than before. 2. No evidence for definite metastatic disease in the chest, abdomen, or pelvis. 3. 12 mm nodule identified deep posterior right costophrenic sulcus. This is in an area of compressive atelectasis and may well be atelectatic. Follow-up CT recommended in 3 months to re-evaluate. 4. Cholelithiasis. 5. Prostatomegaly. 6. Aortic Atherosclerosis (ICD10-I70.0). Electronically Signed   By: Misty Stanley M.D.   On: 04/29/2021 15:18   DG CHEST PORT 1 VIEW  Result Date: 05/23/2021 CLINICAL DATA:  Syncope EXAM: PORTABLE CHEST 1 VIEW COMPARISON:  08/13/2012 FINDINGS: The heart size and mediastinal contours are within normal limits. Both lungs are clear. The visualized skeletal structures are unremarkable. IMPRESSION: No active disease. Electronically Signed   By: Fidela Salisbury M.D.   On: 05/23/2021 03:39   IR PATIENT EVAL TECH 0-60 MINS  Result Date: 05/05/2021 Betsey Holiday     05/10/2021 10:43 AM Pt came in at this time to have dressing replaced for his drain.   ECHOCARDIOGRAM COMPLETE  Result Date: 05/23/2021    ECHOCARDIOGRAM REPORT   Patient Name:   JAKIN PAVAO Date of Exam: 05/23/2021  Medical Rec #:  [id number]       Height:       72.0 in Accession #:    [id number]      Weight:       185.4 lb Date of Birth:  1945-11-04       BSA:          2.063 m Patient Age:    33 years  BP:           136/84 mmHg Patient Gender: M               HR:           93 bpm. Exam Location:  Inpatient Procedure: 2D Echo, Color Doppler, Cardiac Doppler and Intracardiac            Opacification Agent Indications:    R55 Syncope  History:        Patient has no prior history of Echocardiogram examinations.                 Risk Factors:Dyslipidemia.  Sonographer:    Bernadene Person RDCS Referring Phys: [id number] [place]  Sonographer Comments: Technically difficult study due to poor echo windows. IMPRESSIONS  1. Poor acoustic windows limit study.  2. Left ventricular ejection fraction, by estimation, is 60 to 65%. The left ventricle has normal function. Left ventricular diastolic function could not be evaluated.  3. Right ventricular systolic function is normal. The right ventricular size is normal.  4. The mitral valve was not well visualized. Trivial mitral valve regurgitation.  5. The aortic valve was not well visualized. Aortic valve regurgitation is not visualized. No aortic stenosis is present.  6. Aortic dilatation noted. There is mild dilatation of the aortic root, measuring 41 mm. There is mild dilatation of the ascending aorta, measuring 42 mm. FINDINGS  Left Ventricle: Extremely poor acoustic windows limit study Overall LVEF is normal with no definite wall motion abnormalities. Left ventricular ejection fraction, by estimation, is 60 to 65%. The left ventricle has normal function. Definity contrast agent was given IV to delineate the left ventricular endocardial borders. The left ventricular internal cavity size was normal in size. There is no left ventricular hypertrophy. Left ventricular diastolic function could not be evaluated. Right Ventricle: The right ventricular size is normal. Right ventricular  systolic function is normal. Left Atrium: Left atrial size was normal in size. Right Atrium: Right atrial size was normal in size. Mitral Valve: The mitral valve was not well visualized. There is mild thickening of the mitral valve leaflet(s). Trivial mitral valve regurgitation. Tricuspid Valve: The tricuspid valve is not well visualized. Aortic Valve: The aortic valve was not well visualized. Aortic valve regurgitation is not visualized. No aortic stenosis is present. Pulmonic Valve: The pulmonic valve was not well visualized. Pulmonic valve regurgitation is not visualized. Aorta: Aortic dilatation noted. There is mild dilatation of the aortic root, measuring 41 mm. There is mild dilatation of the ascending aorta, measuring 42 mm. IAS/Shunts: No atrial level shunt detected by color flow Doppler.  LEFT VENTRICLE PLAX 2D LVIDd:         3.90 cm  Diastology LVIDs:         2.50 cm  LV e' medial:   4.35 cm/s LV PW:         1.10 cm  LV E/e' medial: 9.3 LV IVS:        1.10 cm LVOT diam:     2.30 cm LVOT Area:     4.15 cm  LEFT ATRIUM         Index LA diam:    2.60 cm 1.26 cm/m   AORTA Ao Root diam: 4.10 cm Ao Asc diam:  4.20 cm MITRAL VALVE MV Area (PHT): 5.75 cm    SHUNTS MV Decel Time: 132 msec    Systemic Diam: 2.30 cm MV E velocity: 40.40 cm/s MV A velocity: 48.30 cm/s  MV E/A ratio:  0.84 Dorris Carnes MD Electronically signed by Dorris Carnes MD Signature Date/Time: 05/23/2021/12:46:22 PM    Final     Lab Data:  CBC: Recent Labs  Lab 05/21/21 0509 05/22/21 0504 05/23/21 0206 05/24/21 0240 05/25/21 0706  WBC  --   --  14.1* 9.3 10.5  NEUTROABS  --   --  10.0*  --   --   HGB 9.7* 9.8* 12.0* 11.8* 10.5*  HCT 28.4* 29.2* 34.3* 34.3* 30.3*  MCV  --   --  91.5 92.5 91.5  PLT  --   --  308 222 725   Basic Metabolic Panel: Recent Labs  Lab 05/21/21 0509 05/22/21 0504 05/23/21 0206 05/23/21 0506 05/24/21 0240 05/25/21 0706  NA 132* 130* 129*  --  128* 122*  K 3.8 3.7 4.0  --  4.3 3.8  CL 97* 96* 96*   --  95* 89*  CO2 27 28 22   --  24 24  GLUCOSE 104* 101* 136*  --  116* 100*  BUN 6* 8 9  --  14 16  CREATININE 0.63 0.76 0.87  --  0.78 0.74  CALCIUM 9.0 8.6* 9.2  --  8.6* 8.0*  MG  --   --   --  1.5* 1.7 1.7   GFR: Estimated Creatinine Clearance: 90.3 mL/min (by C-G formula based on SCr of 0.74 mg/dL). Liver Function Tests: Recent Labs  Lab 05/23/21 0206 05/24/21 0240  AST 17 22  ALT 12 15  ALKPHOS 46 40  BILITOT 0.6 0.6  PROT 6.5 5.8*  ALBUMIN 3.0* 2.6*   No results for input(s): LIPASE, AMYLASE in the last 168 hours. No results for input(s): AMMONIA in the last 168 hours. Coagulation Profile: Recent Labs  Lab 05/23/21 0206  INR 0.9   Cardiac Enzymes: No results for input(s): CKTOTAL, CKMB, CKMBINDEX, TROPONINI in the last 168 hours. BNP (last 3 results) No results for input(s): PROBNP in the last 8760 hours. HbA1C: Recent Labs    05/23/21 0506  HGBA1C 5.0   CBG: Recent Labs  Lab 05/23/21 0151 05/23/21 0611 05/23/21 1249 05/24/21 0545 05/25/21 0522  GLUCAP 131* 143* 116* 117* 104*   Lipid Profile: No results for input(s): CHOL, HDL, LDLCALC, TRIG, CHOLHDL, LDLDIRECT in the last 72 hours. Thyroid Function Tests: Recent Labs    05/24/21 0240  TSH 6.671*   Anemia Panel: No results for input(s): VITAMINB12, FOLATE, FERRITIN, TIBC, IRON, RETICCTPCT in the last 72 hours. Urine analysis:    Component Value Date/Time   COLORURINE YELLOW 02/20/2009 2130   APPEARANCEUR CLEAR 02/20/2009 2130   LABSPEC 1.019 02/20/2009 2130   PHURINE 6.5 02/20/2009 2130   GLUCOSEU NEGATIVE 02/20/2009 2130   HGBUR NEGATIVE 02/20/2009 2130   BILIRUBINUR NEGATIVE 02/20/2009 2130   KETONESUR NEGATIVE 02/20/2009 2130   PROTEINUR NEGATIVE 02/20/2009 2130   UROBILINOGEN 0.2 02/20/2009 2130   NITRITE NEGATIVE 02/20/2009 2130   LEUKOCYTESUR  02/20/2009 2130    NEGATIVE MICROSCOPIC NOT DONE ON URINES WITH NEGATIVE PROTEIN, BLOOD, LEUKOCYTES, NITRITE, OR GLUCOSE <1000 mg/dL.      Estill Cotta M.D. [facility] 05/25/2021, 2:20 PM  Available via Epic secure chat 7am-7pm After 7 pm, please refer to night coverage provider listed on amion.

## 2021-05-25 NOTE — TOC Progression Note (Signed)
Transition of Care [facility]) - Progression Note    Patient Details  Name: Lance Friedman MRN: [id number] Date of Birth: 1946-08-18  [facility] CM/SW Contact  Deavion Dobbs, Juliann Pulse, RN Phone Number: 05/25/2021, 2:59 PM  Clinical Narrative:  Active w/[facility] HHRN/HHPT-rep Cheryl following-awaiting HHC orders & d/c.     Expected Discharge Plan: [place] Barriers to Discharge: Continued Medical Work up  Expected Discharge Plan and Services Expected Discharge Plan: [place]   Discharge Planning Services: CM Consult Post Acute Care Choice: [place] arrangements for the past 2 months: Apartment                           HH Arranged: RN, PT HH Agency: [place] Date [place]: 05/25/21 Time [place]: [phone] Representative spoke with at [place]: [place] ([place]) Interventions    Readmission Risk Interventions No flowsheet data found.

## 2021-05-25 NOTE — Progress Notes (Signed)
7 Days Post-Op   Subjective/Chief Complaint:   1 - Bladder + Prostate Cancer - s/p robotic cystoprostatectomy with pelvic node dissection and conduit diversion on 05/18/21  for pT2N0Mx urothelial carinoma and pT2N0Mx Grade 1 prostate cancer with negative margins.   Admitted 9/13 for bowel prep and stomal marking. Has Rt neph tube (currently capped). JP Cr 0. (Same as serum) 9/17.   2 - Peri-OP Ileus - s/p small bowel anastamosis as part of conduit diversion. Received entered pre/post-op. NPO intially with ice chips, advanced to clears POD1, then return of flatus and advanced to regular by 9/18. Reglan added 9/20.   3 - Disposition / Rehab - pt independent at baseline. Ostomy RN team working with pt in house. PT eval 9/15 recs home health PT and rolling walker.   4- Right Malignant Ureteral Obstruction - rt neph tube dependant pre-op due to obstruciton from bladder cancer. Tube capped as part of surgery 9/16.  5 - Vagal Episode - pt with episode of brief loss of concious 9/18 PM. Cr, glucose, TropI normal. Was a bit negative fluid balance and given fluid bolus, stepdown monitoring. NO CP/SOB. No focal weakness. Transferred back to [facility] 9/20.   Today "Lance Friedman" is continuing to progress. Less abd distension, continued flatus. Tolerating reg diet, but not yet at goal. Not ambulated  as much past few days given vagal episode, but doing better today, more confident.    Objective: Vital signs in last 24 hours: Temp:  [97.4 F (36.3 C)-98.3 F (36.8 C)] 98.3 F (36.8 C) (09/21 1133) Pulse Rate:  [92-102] 92 (09/21 1133) Resp:  [16-18] 16 (09/21 1133) BP: (131-136)/(85-95) 131/89 (09/21 1133) SpO2:  [97 %-99 %] 97 % (09/21 1133) Weight:  [91.8 kg] 91.8 kg (09/21 0500) Last BM Date: 05/23/21  Intake/Output from previous day: 09/20 0701 - 09/21 0700 In: 936.5 [I.V.:361.1; IV Piggyback:575.5] Out: 601 [Urine:200; Drains:735] Intake/Output this shift: Total I/O In: 360 [P.O.:360] Out: 1365  [Urine:1075; Drains:290]  EXAM: NAD, AOx3.  RRR, sinus, non-labored breathing on minimal NCO2 Mildabd distensino no r/g. RLQ urostomy pink/patent with Rt (red) and Lt (blue) bander stents, recnt surgical sites c/d/I JP with non-fouul serous fluid, Rt neph tube capped. SCD's in place. No c/c/e.   Lab Results:  Recent Labs    05/24/21 0240 05/25/21 0706  WBC 9.3 10.5  HGB 11.8* 10.5*  HCT 34.3* 30.3*  PLT 222 282   BMET Recent Labs    05/24/21 0240 05/25/21 0706 05/25/21 1459  NA 128* 122* 123*  K 4.3 3.8  --   CL 95* 89*  --   CO2 24 24  --   GLUCOSE 116* 100*  --   BUN 14 16  --   CREATININE 0.78 0.74  --   CALCIUM 8.6* 8.0*  --    PT/INR Recent Labs    05/23/21 0206  LABPROT 12.5  INR 0.9   ABG No results for input(s): PHART, HCO3 in the last 72 hours.  Invalid input(s): PCO2, PO2  Studies/Results: No results found.  Anti-infectives: Anti-infectives (From admission, onward)    Start     Dose/Rate Route Frequency Ordered Stop   05/18/21 0600  piperacillin-tazobactam (ZOSYN) IVPB 3.375 g  Status:  Discontinued        3.375 g 100 mL/hr over 30 Minutes Intravenous 30 min pre-op 05/17/21 0834 05/17/21 1332   05/18/21 0600  piperacillin-tazobactam (ZOSYN) IVPB 3.375 g        3.375 g 100 mL/hr over 30 Minutes  Intravenous 30 min pre-op 05/17/21 1332 05/18/21 0920   05/17/21 1600  metroNIDAZOLE (FLAGYL) tablet 500 mg        500 mg Oral Every 4 hours 05/17/21 1355 05/17/21 2350   05/17/21 1600  neomycin (MYCIFRADIN) tablet 500 mg        500 mg Oral Every 4 hours 05/17/21 1355 05/17/21 2350       Assessment/Plan:  7 Days Post-Op   Subjective/Chief Complaint:   1 - Bladder Cancer - s/p robotic cystoprostatectomy with pelvic node dissection and conduit diversion on 05/18/21. Path pending. Admitted 9/13 for bowel prep and stomal marking. Has Rt neph tube (currently capped). JP Cr 0. (Same as serum) 9/17.   2 - Peri-OP Ileus - s/p small bowel anastamosis  as part of conduit diversion. Received entered pre/post-op. NPO intially with ice chips, advanced to clears POD1, then return of flatus and advanced to regular by 9/18. Reglan added 9/20.   3 - Disposition / Rehab - pt independent at baseline. Ostomy RN team working with pt in house. PT eval 9/15 recs home health PT and rolling walker.   4- Right Malignant Ureteral Obstruction - rt neph tube dependant pre-op due to obstruciton from bladder cancer. Tube capped as part of surgery 9/16.  5 - Vagal Episode - pt with episode of brief loss of concious 9/18 PM. Cr, glucose, TropI normal. Was a bit negative fluid balance and given fluid bolus, stepdown monitoring. NO CP/SOB. No focal weakness.   Today "Lance Friedman" is stable. Minimal response to suppository, feels distended c/w partial ileus. NO fevers, No more vagal episodes.   Objective: Vital signs in last 24 hours: Temp:  [97.4 F (36.3 C)-98.3 F (36.8 C)] 98.3 F (36.8 C) (09/21 1133) Pulse Rate:  [92-102] 92 (09/21 1133) Resp:  [16-18] 16 (09/21 1133) BP: (131-136)/(85-95) 131/89 (09/21 1133) SpO2:  [97 %-99 %] 97 % (09/21 1133) Weight:  [91.8 kg] 91.8 kg (09/21 0500) Last BM Date: 05/23/21  Intake/Output from previous day: 09/20 0701 - 09/21 0700 In: 936.5 [I.V.:361.1; IV Piggyback:575.5] Out: 211 [Urine:200; Drains:735] Intake/Output this shift: Total I/O In: 360 [P.O.:360] Out: 1365 [Urine:1075; Drains:290]   EXAM: NAD, AOx3.  HR 101, sinus, non-labored breathing on minimal NCO2 Mildabd distensino no r/g. RLQ urostomy pink/patent with Rt (red) and Lt (blue) bander stents, recnt surgical sites c/d/I JP with non-fouul serous fluid, Rt neph tube capped. SCD's in place. No c/c/e.   Lab Results:  Recent Labs    05/24/21 0240 05/25/21 0706  WBC 9.3 10.5  HGB 11.8* 10.5*  HCT 34.3* 30.3*  PLT 222 282   BMET Recent Labs    05/24/21 0240 05/25/21 0706 05/25/21 1459  NA 128* 122* 123*  K 4.3 3.8  --   CL 95* 89*  --   CO2  24 24  --   GLUCOSE 116* 100*  --   BUN 14 16  --   CREATININE 0.78 0.74  --   CALCIUM 8.6* 8.0*  --    PT/INR Recent Labs    05/23/21 0206  LABPROT 12.5  INR 0.9   ABG No results for input(s): PHART, HCO3 in the last 72 hours.  Invalid input(s): PCO2, PO2  Studies/Results: No results found.  Anti-infectives: Anti-infectives (From admission, onward)    Start     Dose/Rate Route Frequency Ordered Stop   05/18/21 0600  piperacillin-tazobactam (ZOSYN) IVPB 3.375 g  Status:  Discontinued        3.375 g 100 mL/hr over  30 Minutes Intravenous 30 min pre-op 05/17/21 0834 05/17/21 1332   05/18/21 0600  piperacillin-tazobactam (ZOSYN) IVPB 3.375 g        3.375 g 100 mL/hr over 30 Minutes Intravenous 30 min pre-op 05/17/21 1332 05/18/21 0920   05/17/21 1600  metroNIDAZOLE (FLAGYL) tablet 500 mg        500 mg Oral Every 4 hours 05/17/21 1355 05/17/21 2350   05/17/21 1600  neomycin (MYCIFRADIN) tablet 500 mg        500 mg Oral Every 4 hours 05/17/21 1355 05/17/21 2350       Assessment/Plan:  Doing acceptabl clinically POD7. Ilues slowly resolving. Importance of walking reinforced. Appreciate PT, Ostomy RN, and hospitalist (Dr Tana Coast) help.      [name] 05/25/2021    [name] 05/25/2021

## 2021-05-26 DIAGNOSIS — N020 Recurrent and persistent hematuria with minor glomerular abnormality: Secondary | ICD-10-CM | POA: Diagnosis not present

## 2021-05-26 DIAGNOSIS — C670 Malignant neoplasm of trigone of bladder: Secondary | ICD-10-CM | POA: Diagnosis not present

## 2021-05-26 DIAGNOSIS — K2100 Gastro-esophageal reflux disease with esophagitis, without bleeding: Secondary | ICD-10-CM | POA: Diagnosis not present

## 2021-05-26 DIAGNOSIS — R55 Syncope and collapse: Secondary | ICD-10-CM | POA: Diagnosis not present

## 2021-05-26 LAB — BASIC METABOLIC PANEL
Anion gap: 4 — ABNORMAL LOW (ref 5–15)
BUN: 12 mg/dL (ref 8–23)
CO2: 25 mmol/L (ref 22–32)
Calcium: 8.2 mg/dL — ABNORMAL LOW (ref 8.9–10.3)
Chloride: 101 mmol/L (ref 98–111)
Creatinine, Ser: 0.79 mg/dL (ref 0.61–1.24)
GFR, Estimated: >60 mL/min (ref >60)
Glucose, Bld: 95 mg/dL (ref 70–99)
Potassium: 3.7 mmol/L (ref 3.5–5.1)
Sodium: 130 mmol/L — ABNORMAL LOW (ref 135–145)

## 2021-05-26 LAB — SODIUM: Sodium: 127 mmol/L — ABNORMAL LOW (ref 135–145)

## 2021-05-26 LAB — GLUCOSE, CAPILLARY: Glucose-Capillary: 90 mg/dL (ref 70–99)

## 2021-05-26 NOTE — Progress Notes (Addendum)
[facility] consult progress note                                                                              Patient Demographics  Lance Friedman, is a 75 y.o. male, DOB - [birth year], IFO:[id number]  Admit date - 05/17/2021   Admitting Physician [name], MD  Outpatient Primary MD for the patient is [name], MD  Outpatient specialists:   LOS - 9  days   Medical records reviewed and are as summarized below:    No chief complaint on file.      Brief summary   Patient is a 75 year old male with history of bladder CA, hypothyroidism, hyperlipidemia admitted on 05/17/2021 on urology service, now status post roboticassisted laparoscopic complete cystectomy, prostatectomy and ileal conduit with lymph node resection.  Overnight 9/18-9/19 CODE BLUE was called on the patient after having a syncopal episode associated with hypotension and hospitalist team was consulted.  It does not appear that he ever lost pulse or stopped breathing.  This was few hours after nursing staff noticed a significant increase in his JP drainage   Assessment & Plan    Principal Problem:   Syncope and collapse -Likely due to orthostatic hypotension from dehydration, still negative balance of 1.9 L -Patient was placed on IV fluids, TED hose, repeat orthostatic vitals negative  -Cortisol level normal, B12, folate normal -2D echo showed normal EF 60 to 60%, RV normal -Continue PT OT  Active Problems: Acute on chronic hyponatremia -Likely secondary to #1, dehydration -Serum osmolality 272, urine osmolality 122, urine sodium <10 -Patient was placed on IV fluid hydration, sodium has improved to 130, asymptomatic, appears to have chronic hyponatremia on the review of labs     Bladder cancer [facility]) - status post cystectomy, prostatectomy and ileal conduit.   - Management per primary, urology.   - JP drain management per urology     Hypothyroidism -TSH 6.6, continue  Synthroid   Normocytic anemia -H&H stable, 10.5, no bleeding  Hyperlipidemia  -Continue statin  Generalized debility -Continue PT OT evaluation  Constipation/ileus -Improved, feels better today   Code Status: Full CODE STATUS DVT Prophylaxis:  Place TED hose Start: 05/24/21 0826 enoxaparin (LOVENOX) injection 40 mg Start: 05/21/21 1230 SCDs Start: 05/18/21 1622 Place and maintain sequential compression device Start: 05/17/21 1351   Level of Care: Level of care: Telemetry Family Communication: Discussed all imaging results, lab results, explained to the patient    Disposition Plan:     Status is: Inpatient  Remains inpatient appropriate because:Inpatient level of care appropriate due to severity of illness  Dispo: Per urology Patient is stable from medical standpoint for discharge.  Sodium has improved, states does not feel dizzy or lightheaded.  Orthostatic vitals negative. I will sign off.  Please call me for any questions.  Time Spent in minutes 25 minutes   Antimicrobials:   Anti-infectives (From admission, onward)    Start     Dose/Rate Route Frequency Ordered Stop   05/18/21 0600  piperacillin-tazobactam (ZOSYN) IVPB 3.375 g  Status:  Discontinued        3.375 g 100 mL/hr over 30 Minutes  Intravenous 30 min pre-op 05/17/21 0834 05/17/21 1332   05/18/21 0600  piperacillin-tazobactam (ZOSYN) IVPB 3.375 g        3.375 g 100 mL/hr over 30 Minutes Intravenous 30 min pre-op 05/17/21 1332 05/18/21 0920   05/17/21 1600  metroNIDAZOLE (FLAGYL) tablet 500 mg        500 mg Oral Every 4 hours 05/17/21 1355 05/17/21 2350   05/17/21 1600  neomycin (MYCIFRADIN) tablet 500 mg        500 mg Oral Every 4 hours 05/17/21 1355 05/17/21 2350          Medications  Scheduled Meds:  acetaminophen  1,000 mg Oral Q6H   Chlorhexidine Gluconate Cloth  6 each Topical Q0600   enoxaparin (LOVENOX) injection  40 mg Subcutaneous Q24H   levothyroxine  112 mcg Oral QAC breakfast    metoCLOPramide (REGLAN) injection  5 mg Intravenous Q8H   multivitamin with minerals  1 tablet Oral Daily   pantoprazole  40 mg Oral Q0600   pravastatin  20 mg Oral q1800   senna-docusate  2 tablet Oral QHS   sodium chloride flush  3 mL Intravenous Q12H   Vitamin E  800 Units Oral Daily   Continuous Infusions:  sodium chloride 100 mL/hr at 05/25/21 1458   PRN Meds:.diphenhydrAMINE **OR** diphenhydrAMINE, famotidine, HYDROmorphone (DILAUDID) injection, naphazoline-glycerin, ondansetron, oxyCODONE      Subjective:   Lance Friedman was seen and examined today.  Feels better, does not feel dizzy or lightheaded anymore.  States he is trying to work with PT so he can go home and not have to go to SNF.  Objective:   Vitals:   05/25/21 2049 05/26/21 0547 05/26/21 0750 05/26/21 0956  BP: 123/81 135/88    Pulse: 96 95  89  Resp: 18   13  Temp: 99.1 F (37.3 C) 98 F (36.7 C)    TempSrc:  Oral    SpO2: 95% 97%  98%  Weight:   93.4 kg   Height:        Intake/Output Summary (Last 24 hours) at 05/26/2021 1220 Last data filed at 05/26/2021 1000 Gross per 24 hour  Intake 3288.88 ml  Output 5655 ml  Net -2366.12 ml     Wt Readings from Last 3 Encounters:  05/26/21 93.4 kg  04/29/21 84.1 kg  03/31/21 85.3 kg   Physical Exam General: Alert and oriented x 3, NAD, pleasant  Cardiovascular: S1 S2 clear, RRR. No pedal edema b/l Respiratory: CTAB, no wheezing, rales or rhonchi Gastrointestinal: RLQ urostomy, JP drain +, not distended, + BS Ext: no pedal edema bilaterally Neuro: no new deficits Psych: Normal affect and demeanor, alert and oriented x3    Data Reviewed:  I have personally reviewed following labs and imaging studies  Micro Results Recent Results (from the past 240 hour(s))  Surgical PCR screen     Status: Abnormal   Collection Time: 05/17/21  3:43 PM   Specimen: Nasal Mucosa; Nasal Swab  Result Value Ref Range Status   MRSA, PCR NEGATIVE NEGATIVE Final    Staphylococcus aureus POSITIVE (A) NEGATIVE Final    Comment: (NOTE) The Xpert SA Assay (FDA approved for NASAL specimens in patients 35 years of age and older), is one component of a comprehensive surveillance program. It is not intended to diagnose infection nor to guide or monitor treatment. Performed at [facility], [place] [address], [place], [place] [address]   SARS CORONAVIRUS 2 (TAT 6-24 HRS) Nasopharyngeal Nasopharyngeal Swab  Status: None   Collection Time: 05/17/21  5:10 PM   Specimen: Nasopharyngeal Swab  Result Value Ref Range Status   SARS Coronavirus 2 NEGATIVE NEGATIVE Final    Comment: (NOTE) SARS-CoV-2 target nucleic acids are NOT DETECTED.  The SARS-CoV-2 RNA is generally detectable in upper and lower respiratory specimens during the acute phase of infection. Negative results do not preclude SARS-CoV-2 infection, do not rule out co-infections with other pathogens, and should not be used as the sole basis for treatment or other patient management decisions. Negative results must be combined with clinical observations, patient history, and epidemiological information. The expected result is Negative.  Fact Sheet for Patients: SugarRoll.be  Fact Sheet for Healthcare Providers: https://www.woods-mathews.com/  This test is not yet approved or cleared by the [place] FDA and  has been authorized for detection and/or diagnosis of SARS-CoV-2 by FDA under an Emergency Use Authorization (EUA). This EUA will remain  in effect (meaning this test can be used) for the duration of the COVID-19 declaration under Se ction 564(b)(1) of the Act, 21 U.S.C. section 360bbb-3(b)(1), unless the authorization is terminated or revoked sooner.  Performed at [facility], [place] [address] [place], [place] [address]   MRSA Next Gen by PCR, Nasal     Status: None   Collection Time: 05/23/21  3:29 AM   Specimen:  Nasal Mucosa; Nasal Swab  Result Value Ref Range Status   MRSA by PCR Next Gen NOT DETECTED NOT DETECTED Final    Comment: (NOTE) The GeneXpert MRSA Assay (FDA approved for NASAL specimens only), is one component of a comprehensive MRSA colonization surveillance program. It is not intended to diagnose MRSA infection nor to guide or monitor treatment for MRSA infections. Test performance is not FDA approved in patients less than 75 years old. Performed at [facility], [place] [address] [place], [place] [address]     Radiology Reports DG Abd 1 View  Result Date: 05/23/2021 CLINICAL DATA:  Syncope EXAM: ABDOMEN - 1 VIEW COMPARISON:  CT 04/28/2021 FINDINGS: Multiple gas-filled loops of small and large bowel are seen throughout the abdomen most in keeping with an adynamic ileus. There is mottled gas within the right lower quadrant of the abdomen most in keeping with subcutaneous gas within the right lower quadrant abdominal wall. Right percutaneous nephrostomy is unchanged. Interval placement of bilateral retrograde ureteral stents suggest interval creation of a ileal conduit and right lower quadrant urostomy. No gross free intraperitoneal gas. Surgical drain is seen within the low pelvis, partially excluded from the examination. IMPRESSION: Interval surgical changes of probable ileal conduit formation with bilateral retrograde ureteral stents in place. Unchanged right percutaneous nephrostomy. Adynamic ileus. Electronically Signed   By: Fidela Salisbury M.D.   On: 05/23/2021 03:35   CT CHEST ABDOMEN PELVIS W CONTRAST  Result Date: 04/29/2021 CLINICAL DATA:  Bladder cancer.  Restaging. EXAM: CT CHEST, ABDOMEN, AND PELVIS WITH CONTRAST TECHNIQUE: Multidetector CT imaging of the chest, abdomen and pelvis was performed following the standard protocol during bolus administration of intravenous contrast. CONTRAST:  68mL OMNIPAQUE IOHEXOL 350 MG/ML SOLN COMPARISON:  11/25/2020 FINDINGS: CT  CHEST FINDINGS Cardiovascular: The heart size is normal. No substantial pericardial effusion. Coronary artery calcification is evident. Mitral annular calcification noted. Mild atherosclerotic calcification is noted in the wall of the thoracic aorta. Mediastinum/Nodes: No mediastinal lymphadenopathy. There is no hilar lymphadenopathy. The esophagus has normal imaging features. There is no axillary lymphadenopathy. Lungs/Pleura: Architectural distortion/scarring noted anterior right upper lobe. 12  mm nodule identified deep posterior right costophrenic sulcus on image 132/4. This is in an area of compressive atelectasis and may be atelectatic. No suspicious pulmonary nodule or mass. No focal airspace consolidation. No pleural effusion. Musculoskeletal: No worrisome lytic or sclerotic osseous abnormality. Degenerative changes noted both shoulders. CT ABDOMEN PELVIS FINDINGS Hepatobiliary: No suspicious focal abnormality within the liver parenchyma. Layering tiny calcified gallstones evident. No intrahepatic or extrahepatic biliary dilation. Pancreas: No focal mass lesion. No dilatation of the main duct. No intraparenchymal cyst. No peripancreatic edema. Spleen: No splenomegaly. No focal mass lesion. Adrenals/Urinary Tract: No adrenal nodule or mass. Right percutaneous nephrostomy tube noted with tip formed in the region of the central renal pelvis. No hydronephrosis. Left kidney unremarkable. No evidence for hydroureter. Bladder is decompressed with apparent diffuse bladder wall thickening. The right-sided enhancing bladder mass seen on the previous study is not well demonstrated today although a right-sided bladder diverticulum persists, smaller than before. Stomach/Bowel: Stomach is unremarkable. No gastric wall thickening. No evidence of outlet obstruction. Duodenum is normally positioned as is the ligament of Treitz. No small bowel wall thickening. No small bowel dilatation. The terminal ileum is normal. The  appendix is normal. No gross colonic mass. No colonic wall thickening. Vascular/Lymphatic: There is mild atherosclerotic calcification of the abdominal aorta without aneurysm. There is no gastrohepatic or hepatoduodenal ligament lymphadenopathy. No retroperitoneal or mesenteric lymphadenopathy. No pelvic sidewall lymphadenopathy. Both common iliac arteries are increased diameter measuring up to 2.0 cm on the right, stable. Reproductive: Prostate gland is enlarged. Other: No intraperitoneal free fluid. Musculoskeletal: No worrisome lytic or sclerotic osseous abnormality. IMPRESSION: 1. The right-sided enhancing bladder mass seen on the previous study is not well demonstrated today although a right-sided bladder diverticulum persists, smaller than before. 2. No evidence for definite metastatic disease in the chest, abdomen, or pelvis. 3. 12 mm nodule identified deep posterior right costophrenic sulcus. This is in an area of compressive atelectasis and may well be atelectatic. Follow-up CT recommended in 3 months to re-evaluate. 4. Cholelithiasis. 5. Prostatomegaly. 6. Aortic Atherosclerosis (ICD10-I70.0). Electronically Signed   By: Misty Stanley M.D.   On: 04/29/2021 15:18   DG CHEST PORT 1 VIEW  Result Date: 05/23/2021 CLINICAL DATA:  Syncope EXAM: PORTABLE CHEST 1 VIEW COMPARISON:  08/13/2012 FINDINGS: The heart size and mediastinal contours are within normal limits. Both lungs are clear. The visualized skeletal structures are unremarkable. IMPRESSION: No active disease. Electronically Signed   By: Fidela Salisbury M.D.   On: 05/23/2021 03:39   IR PATIENT EVAL TECH 0-60 MINS  Result Date: 05/05/2021 Betsey Holiday     05/10/2021 10:43 AM Pt came in at this time to have dressing replaced for his drain.   ECHOCARDIOGRAM COMPLETE  Result Date: 05/23/2021    ECHOCARDIOGRAM REPORT   Patient Name:   ELAINE MIDDLETON Date of Exam: 05/23/2021 Medical Rec #:  [id number]       Height:       72.0 in Accession #:     [id number]      Weight:       185.4 lb Date of Birth:  [birth year]       BSA:          2.063 m Patient Age:    68 years        BP:           136/84 mmHg Patient Gender: M               HR:  93 bpm. Exam Location:  Inpatient Procedure: 2D Echo, Color Doppler, Cardiac Doppler and Intracardiac            Opacification Agent Indications:    R55 Syncope  History:        Patient has no prior history of Echocardiogram examinations.                 Risk Factors:Dyslipidemia.  Sonographer:    Bernadene Person RDCS Referring Phys: [id number] [place]  Sonographer Comments: Technically difficult study due to poor echo windows. IMPRESSIONS  1. Poor acoustic windows limit study.  2. Left ventricular ejection fraction, by estimation, is 60 to 65%. The left ventricle has normal function. Left ventricular diastolic function could not be evaluated.  3. Right ventricular systolic function is normal. The right ventricular size is normal.  4. The mitral valve was not well visualized. Trivial mitral valve regurgitation.  5. The aortic valve was not well visualized. Aortic valve regurgitation is not visualized. No aortic stenosis is present.  6. Aortic dilatation noted. There is mild dilatation of the aortic root, measuring 41 mm. There is mild dilatation of the ascending aorta, measuring 42 mm. FINDINGS  Left Ventricle: Extremely poor acoustic windows limit study Overall LVEF is normal with no definite wall motion abnormalities. Left ventricular ejection fraction, by estimation, is 60 to 65%. The left ventricle has normal function. Definity contrast agent was given IV to delineate the left ventricular endocardial borders. The left ventricular internal cavity size was normal in size. There is no left ventricular hypertrophy. Left ventricular diastolic function could not be evaluated. Right Ventricle: The right ventricular size is normal. Right ventricular systolic function is normal. Left Atrium: Left atrial size was normal  in size. Right Atrium: Right atrial size was normal in size. Mitral Valve: The mitral valve was not well visualized. There is mild thickening of the mitral valve leaflet(s). Trivial mitral valve regurgitation. Tricuspid Valve: The tricuspid valve is not well visualized. Aortic Valve: The aortic valve was not well visualized. Aortic valve regurgitation is not visualized. No aortic stenosis is present. Pulmonic Valve: The pulmonic valve was not well visualized. Pulmonic valve regurgitation is not visualized. Aorta: Aortic dilatation noted. There is mild dilatation of the aortic root, measuring 41 mm. There is mild dilatation of the ascending aorta, measuring 42 mm. IAS/Shunts: No atrial level shunt detected by color flow Doppler.  LEFT VENTRICLE PLAX 2D LVIDd:         3.90 cm  Diastology LVIDs:         2.50 cm  LV e' medial:   4.35 cm/s LV PW:         1.10 cm  LV E/e' medial: 9.3 LV IVS:        1.10 cm LVOT diam:     2.30 cm LVOT Area:     4.15 cm  LEFT ATRIUM         Index LA diam:    2.60 cm 1.26 cm/m   AORTA Ao Root diam: 4.10 cm Ao Asc diam:  4.20 cm MITRAL VALVE MV Area (PHT): 5.75 cm    SHUNTS MV Decel Time: 132 msec    Systemic Diam: 2.30 cm MV E velocity: 40.40 cm/s MV A velocity: 48.30 cm/s MV E/A ratio:  0.84 Dorris Carnes MD Electronically signed by Dorris Carnes MD Signature Date/Time: 05/23/2021/12:46:22 PM    Final     Lab Data:  CBC: Recent Labs  Lab 05/21/21 0509 05/22/21 0504 05/23/21 0206 05/24/21  0240 05/25/21 0706  WBC  --   --  14.1* 9.3 10.5  NEUTROABS  --   --  10.0*  --   --   HGB 9.7* 9.8* 12.0* 11.8* 10.5*  HCT 28.4* 29.2* 34.3* 34.3* 30.3*  MCV  --   --  91.5 92.5 91.5  PLT  --   --  308 222 027   Basic Metabolic Panel: Recent Labs  Lab 05/22/21 0504 05/23/21 0206 05/23/21 0506 05/24/21 0240 05/25/21 0706 05/25/21 1459 05/25/21 2236 05/26/21 0639  NA 130* 129*  --  128* 122* 123* 121* 130*  K 3.7 4.0  --  4.3 3.8  --   --  3.7  CL 96* 96*  --  95* 89*  --   --   101  CO2 28 22  --  24 24  --   --  25  GLUCOSE 101* 136*  --  116* 100*  --   --  95  BUN 8 9  --  14 16  --   --  12  CREATININE 0.76 0.87  --  0.78 0.74  --   --  0.79  CALCIUM 8.6* 9.2  --  8.6* 8.0*  --   --  8.2*  MG  --   --  1.5* 1.7 1.7  --   --   --    GFR: Estimated Creatinine Clearance: 97.6 mL/min (by C-G formula based on SCr of 0.79 mg/dL). Liver Function Tests: Recent Labs  Lab 05/23/21 0206 05/24/21 0240  AST 17 22  ALT 12 15  ALKPHOS 46 40  BILITOT 0.6 0.6  PROT 6.5 5.8*  ALBUMIN 3.0* 2.6*   No results for input(s): LIPASE, AMYLASE in the last 168 hours. No results for input(s): AMMONIA in the last 168 hours. Coagulation Profile: Recent Labs  Lab 05/23/21 0206  INR 0.9   Cardiac Enzymes: No results for input(s): CKTOTAL, CKMB, CKMBINDEX, TROPONINI in the last 168 hours. BNP (last 3 results) No results for input(s): PROBNP in the last 8760 hours. HbA1C: No results for input(s): HGBA1C in the last 72 hours.  CBG: Recent Labs  Lab 05/23/21 0611 05/23/21 1249 05/24/21 0545 05/25/21 0522 05/26/21 0748  GLUCAP 143* 116* 117* 104* 90   Lipid Profile: No results for input(s): CHOL, HDL, LDLCALC, TRIG, CHOLHDL, LDLDIRECT in the last 72 hours. Thyroid Function Tests: Recent Labs    05/24/21 0240  TSH 6.671*   Anemia Panel: Recent Labs    05/25/21 0706  VITAMINB12 244  FOLATE 17.4   Urine analysis:    Component Value Date/Time   COLORURINE YELLOW 02/20/2009 2130   APPEARANCEUR CLEAR 02/20/2009 2130   LABSPEC 1.019 02/20/2009 2130   PHURINE 6.5 02/20/2009 2130   GLUCOSEU NEGATIVE 02/20/2009 2130   HGBUR NEGATIVE 02/20/2009 2130   BILIRUBINUR NEGATIVE 02/20/2009 2130   KETONESUR NEGATIVE 02/20/2009 2130   PROTEINUR NEGATIVE 02/20/2009 2130   UROBILINOGEN 0.2 02/20/2009 2130   NITRITE NEGATIVE 02/20/2009 2130   LEUKOCYTESUR  02/20/2009 2130    NEGATIVE MICROSCOPIC NOT DONE ON URINES WITH NEGATIVE PROTEIN, BLOOD, LEUKOCYTES, NITRITE, OR  GLUCOSE <1000 mg/dL.     Estill Cotta M.D. [facility] 05/26/2021, 12:20 PM  Available via Epic secure chat 7am-7pm After 7 pm, please refer to night coverage provider listed on amion.

## 2021-05-26 NOTE — Progress Notes (Signed)
8 Days Post-Op   Subjective/Chief Complaint:   1 - Bladder + Prostate Cancer - s/p robotic cystoprostatectomy with pelvic node dissection and conduit diversion on 05/18/21  for pT2N0Mx urothelial carinoma and pT2N0Mx Grade 1 prostate cancer with negative margins.   Admitted 9/13 for bowel prep and stomal marking. Has Rt neph tube (currently capped). JP Cr 0. (Same as serum) 9/17.   2 - Peri-OP Ileus - s/p small bowel anastamosis as part of conduit diversion. Received entered pre/post-op. NPO intially with ice chips, advanced to clears POD1, then return of flatus and advanced to regular by 9/18. Reglan added 9/20. Full resumed bowel function 9/21.   3 - Disposition / Rehab - pt independent at baseline. Ostomy RN team working with pt in house. PT eval 9/15 recs home health PT and rolling walker.   4- Right Malignant Ureteral Obstruction - rt neph tube dependant pre-op due to obstruciton from bladder cancer. Tube capped as part of surgery 9/16.  5 - Vagal Episode - pt with episode of brief loss of concious 9/18 PM. Cr, glucose, TropI normal. Was a bit negative fluid balance and given fluid bolus, stepdown monitoring. NO CP/SOB. No focal weakness. Transferred back to [facility] 9/20.   Today "Lance Friedman" is doing well. BM x several last night with resolution of mild abd distension.  Objective: Vital signs in last 24 hours: Temp:  [98 F (36.7 C)-99.1 F (37.3 C)] 98 F (36.7 C) (09/22 0547) Pulse Rate:  [92-96] 95 (09/22 0547) Resp:  [16-18] 18 (09/21 2049) BP: (123-135)/(81-89) 135/88 (09/22 0547) SpO2:  [95 %-97 %] 97 % (09/22 0547) Weight:  [93.4 kg] 93.4 kg (09/22 0750) Last BM Date: 05/26/21  Intake/Output from previous day: 09/21 0701 - 09/22 0700 In: 360 [P.O.:360] Out: 5350 [Urine:4725; Drains:625] Intake/Output this shift: No intake/output data recorded.    EXAM: NAD, AOx3.  RRR, sinus, non-labored breathing on minimal RA SNTND, no r/g. RLQ urostomy pink/patent with Rt (red)  and Lt (blue) bander stents, recnt surgical sites c/d/I JP with non-fouul serous fluid, Rt neph tube capped. SCD's in place. No c/c/e.   Lab Results:  Recent Labs    05/24/21 0240 05/25/21 0706  WBC 9.3 10.5  HGB 11.8* 10.5*  HCT 34.3* 30.3*  PLT 222 282   BMET Recent Labs    05/25/21 0706 05/25/21 1459 05/25/21 2236 05/26/21 0639  NA 122*   < > 121* 130*  K 3.8  --   --  3.7  CL 89*  --   --  101  CO2 24  --   --  25  GLUCOSE 100*  --   --  95  BUN 16  --   --  12  CREATININE 0.74  --   --  0.79  CALCIUM 8.0*  --   --  8.2*   < > = values in this interval not displayed.   PT/INR No results for input(s): LABPROT, INR in the last 72 hours. ABG No results for input(s): PHART, HCO3 in the last 72 hours.  Invalid input(s): PCO2, PO2  Studies/Results: No results found.  Anti-infectives: Anti-infectives (From admission, onward)    Start     Dose/Rate Route Frequency Ordered Stop   05/18/21 0600  piperacillin-tazobactam (ZOSYN) IVPB 3.375 g  Status:  Discontinued        3.375 g 100 mL/hr over 30 Minutes Intravenous 30 min pre-op 05/17/21 0834 05/17/21 1332   05/18/21 0600  piperacillin-tazobactam (ZOSYN) IVPB 3.375 g  3.375 g 100 mL/hr over 30 Minutes Intravenous 30 min pre-op 05/17/21 1332 05/18/21 0920   05/17/21 1600  metroNIDAZOLE (FLAGYL) tablet 500 mg        500 mg Oral Every 4 hours 05/17/21 1355 05/17/21 2350   05/17/21 1600  neomycin (MYCIFRADIN) tablet 500 mg        500 mg Oral Every 4 hours 05/17/21 1355 05/17/21 2350       Assessment/Plan:  Doing well POD 8. Plan for DC home tomorrow.  We discussed favorable path results with no further treatment being indicated at this point. Home health RN and PT orders placed, hopefully can be arranged today.    Lance Friedman 05/26/2021

## 2021-05-26 NOTE — Care Management Important Message (Signed)
Important Message  Patient Details IM Letter given to the Patient. Name: Lance Friedman MRN: [id number] Date of Birth: 04/15/1946   Medicare Important Message Given:  Yes     [name] 05/26/2021, 1:35 PM

## 2021-05-26 NOTE — Consult Note (Addendum)
[facility] Nurse ostomy consult note Stoma type/location: Urostomy stoma from surgery on 9/14. Stomal assessment/size: Stoma is red and viable, 1 1/2 inches, above skin level.  2 stints in place Peristomal assessment: few scattered partial thickness wounds are red and dry and healing where previous blisters previously ruptured near the outer tape boarders.  Output: mod amt yellow urine Ostomy pouching: 1pc. Education provided:  Pt was able to assist with pouch change using barrier ring Kellie Simmering # [phone]) and one piece pouch Kellie Simmering # 3).  Pt used a Games developer and asked appropriate questions.  Pt was able to open and close spout to empty and connect and remove bedside drainage bag. Reviewed pouching routines and ordering supplies.  Recommend home health assistance after discharge until patient feels competent to perform pouch changes.  He states he does not have any family members at home to assist.   5 extra sets of barrier rings and pouches left at the bedside, along with educational materials. Enrolled patient in [place] program: Yes, previously  Thank-you,  Julien Girt MSN, RN, [employer]

## 2021-05-26 NOTE — Progress Notes (Signed)
Physical Therapy Treatment Patient Details Name: Lance Friedman MRN: [id number] DOB: 1946/04/08 Today's Date: 05/26/2021   History of Present Illness Pt is 75 yo male with muscle invasive bladder CA/bladder diverticulum and is s/p cystoscopy and laparoscopic radical cystectomy with prostatectomy, bil pelvic lymphadenectomy, and ileal conduit urinary diversion on 05/18/21. Pt with hx including arthritis, bladder CA, chronic neck pain, gout, HLD    PT Comments    POD # 8 Pt progressing well with his mobility. General Gait Details: tolerated an increased distance of 500 feet demonstrating an even alternating gait and light lean on walker.  Pain maintained at 5/10 "tolerable" stated pt.  "Feels good to walk".  Pt is highly movitated.  Recommendations for follow up therapy are one component of a multi-disciplinary discharge planning process, led by the attending physician.  Recommendations may be updated based on patient status, additional functional criteria and insurance authorization.  Follow Up Recommendations  Home health PT;Supervision - Intermittent     Equipment Recommendations  Rolling walker with 5" wheels    Recommendations for Other Services       Precautions / Restrictions Precautions Precautions: Fall Precaution Comments: urostomy, JP Drain     Mobility  Bed Mobility Overal bed mobility: Needs Assistance Bed Mobility: Supine to Sit     Supine to sit: Min assist;HOB elevated     General bed mobility comments: increased time, HOB elavated and use of rails    Transfers Overall transfer level: Needs assistance Equipment used: Rolling walker (2 wheeled) Transfers: Sit to/from [facility] Sit to Stand: Supervision Stand pivot transfers: Supervision       General transfer comment: increased time but self able.  Good safety cognition.  Ambulation/Gait Ambulation/Gait assistance: Supervision Gait Distance (Feet): 500 Feet Assistive device:  Rolling walker (2 wheeled) Gait Pattern/deviations: Step-to pattern;Decreased stride length Gait velocity: decreased   General Gait Details: tolerated an increased distance demonstarting an even alternating gait and light lean on walker.  Pain maintained at 5/10 "tolerable" stated pt.  "Feels good to walk".  Pt is highly movitated.   Stairs             Wheelchair Mobility    Modified Rankin (Stroke Patients Only)       Balance                                            Cognition Arousal/Alertness: Awake/alert Behavior During Therapy: WFL for tasks assessed/performed Overall Cognitive Status: Within Functional Limits for tasks assessed                                 General Comments: AxO x 3 very pleasant and motivated      Exercises      General Comments        Pertinent Vitals/Pain Pain Assessment: 0-10 Pain Score: 4  Pain Location: surgical site, ABD Pain Descriptors / Indicators: Discomfort;Aching;Operative site guarding Pain Intervention(s): Monitored during session;Premedicated before session;Repositioned    Home Living                      Prior Function            PT Goals (current goals can now be found in the care plan section) Progress towards PT goals: Progressing toward goals  Frequency    Min 3X/week      PT Plan Current plan remains appropriate    Co-evaluation              AM-PAC PT "6 Clicks" Mobility   Outcome Measure  Help needed turning from your back to your side while in a flat bed without using bedrails?: A Little Help needed moving from lying on your back to sitting on the side of a flat bed without using bedrails?: A Little Help needed moving to and from a bed to a chair (including a wheelchair)?: A Little Help needed standing up from a chair using your arms (e.g., wheelchair or bedside chair)?: A Little Help needed to walk in hospital room?: A Little Help needed  climbing 3-5 steps with a railing? : A Little 6 Click Score: 18    End of Session Equipment Utilized During Treatment: Gait belt Activity Tolerance: Patient tolerated treatment well Patient left: in chair;with call bell/phone within reach;with family/visitor present Nurse Communication: Mobility status PT Visit Diagnosis: Other abnormalities of gait and mobility (R26.89);Muscle weakness (generalized) (M62.81)     Time: 2094-7096 PT Time Calculation (min) (ACUTE ONLY): 26 min  Charges:  $Gait Training: 23-37 mins                     Rica Koyanagi  PTA Acute  Rehabilitation Services Pager      [phone] Office      [phone]

## 2021-05-27 LAB — GLUCOSE, CAPILLARY: Glucose-Capillary: 95 mg/dL (ref 70–99)

## 2021-05-27 MED ORDER — OXYCODONE-ACETAMINOPHEN 5-325 MG PO TABS: 1.0000 | ORAL_TABLET | Freq: Four times a day (QID) | ORAL | 0 refills | Status: AC | PRN

## 2021-05-27 NOTE — Plan of Care (Signed)
  Problem: Health Behavior/Discharge Planning: Goal: Ability to manage health-related needs will improve Outcome: Progressing   Problem: Clinical Measurements: Goal: Will remain free from infection Outcome: Progressing   Problem: Pain Managment: Goal: General experience of comfort will improve Outcome: Progressing   

## 2021-05-27 NOTE — TOC Transition Note (Signed)
Transition of Care [facility]) - CM/SW Discharge Note   Patient Details  Name: Lance Friedman MRN: [id number] Date of Birth: 08-30-1946  [facility] of [facility]) CM/SW Contact:  Dessa Phi, RN Phone Number: 05/27/2021, 9:21 AM   Clinical Narrative:  d/c home w/HHC/dme rw-to be delivered to rm prior d/c. See notes below.No further CM needs.     Final next level of care: [place] Barriers to Discharge: No Barriers Identified   Patient Goals and CMS Choice Patient states their goals for this hospitalization and ongoing recovery are:: to go back home CMS Medicare.gov Compare Post Acute Care list provided to:: Patient Choice offered to / list presented to : Patient  Discharge Placement                       Discharge Plan and Services   Discharge Planning Services: CM Consult Post Acute Care Choice: Home Health          DME Arranged: Walker rolling DME Agency: [employer] Date DME Agency Contacted: 05/27/21 Time DME Agency Contacted: 1102 Representative spoke with at DME Agency: [place]: RN, PT [facility] Agency: [place] Date [place]: 05/25/21 Time [place]: 1458 Representative spoke with at [place]: [facility] Determinants of Health ([place]) Interventions     Readmission Risk Interventions No flowsheet data found.

## 2021-05-27 NOTE — Progress Notes (Signed)
Patient discharged home, discharge instructions given and explained to patient, he verbalized understanding, patient denies any distress, accompanied home by friend. Surgical incision clean/dry/intact. Ileal conduit intact and draining well.

## 2021-05-27 NOTE — Discharge Summary (Signed)
Physician Discharge Summary  Patient ID: Lance Friedman MRN: [id number] DOB/AGE: 1945/11/25 75 y.o.  Admit date: 05/17/2021 Discharge date: 05/27/2021  Admission Diagnoses: Bladder Cancer  Discharge Diagnoses:  Principal Problem:   Syncope and collapse Active Problems:   Bladder cancer (HCC)   Hypothyroidism   Hyperlipemia   Pre-diabetes   GERD (gastroesophageal reflux disease)   Hematuria   Hyponatremia   Normocytic anemia   Discharged Condition: good  Hospital Course:   1 - Bladder + Prostate Cancer - s/p robotic cystoprostatectomy with pelvic node dissection and conduit diversion on 05/18/21  for pT2N0Mx urothelial carinoma and pT2N0Mx Grade 1 prostate cancer with negative margins.   Admitted 9/13 for bowel prep and stomal marking. Has Rt neph tube (currently capped). JP Cr 0. (Same as serum) 9/17 and removed 9/22.    2 - Peri-OP Ileus - s/p small bowel anastamosis as part of conduit diversion. Received entered pre/post-op. NPO intially with ice chips, advanced to clears POD1, then return of flatus and advanced to regular by 9/18. Reglan added 9/20. Full resumed bowel function 9/21.    3 - Disposition / Rehab - pt independent at baseline. Ostomy RN team working with pt in house. PT eval 9/15 recs home health PT and rolling walker.    4- Right Malignant Ureteral Obstruction - rt neph tube dependant pre-op due to obstruciton from bladder cancer. Tube capped as part of surgery 9/16.   5 - Vagal Episode - pt with episode of brief loss of concious 9/18 PM. Cr, glucose, TropI normal. Was a bit negative fluid balance and given fluid bolus, stepdown monitoring. NO CP/SOB. No focal weakness. Transferred back to [facility] 9/20.    By the AM of 05/27/21, the day of discharge, he is ambulatory, tolerating PO nutrition, resumed bowel function, pain controlled on PO meds and felt to be adequate for discharge.   Consults:  Physical Therapy, Ostomy RN, Hospitalist  Significant Diagnostic  Studies: labs: as per above  Treatments: surgery: as per above  Discharge Exam: Blood pressure 134/84, pulse 85, temperature 97.6 F (36.4 C), temperature source Oral, resp. rate 16, height 6' (1.829 m), weight 90.3 kg, SpO2 97 %.   EXAM: NAD, AOx3.  RRR, sinus, non-labored breathing on minimal RA SNTND, no r/g. RLQ urostomy pink/patent with Rt (red) and Lt (blue) bander stents, recnt surgical sites c/d/I Rt neph tube capped. SCD's in place. No c/c/e.   Disposition: HOME      Follow-up Information     [name], MD Follow up on 06/07/2021.   Specialty: Urology Why: at 9:30 Contact information: [place] [address] [phone]         [facility] Follow up.   Why: [facility] physical therapy Contact information: [address] [place] [address] [phone]                 Signed: [name] 05/27/2021, 7:23 AM

## 2021-05-29 DIAGNOSIS — Z466 Encounter for fitting and adjustment of urinary device: Secondary | ICD-10-CM | POA: Diagnosis not present

## 2021-05-29 DIAGNOSIS — G620 Drug-induced polyneuropathy: Secondary | ICD-10-CM | POA: Diagnosis not present

## 2021-05-29 DIAGNOSIS — E871 Hypo-osmolality and hyponatremia: Secondary | ICD-10-CM | POA: Diagnosis not present

## 2021-05-29 DIAGNOSIS — M1990 Unspecified osteoarthritis, unspecified site: Secondary | ICD-10-CM | POA: Diagnosis not present

## 2021-05-29 DIAGNOSIS — K219 Gastro-esophageal reflux disease without esophagitis: Secondary | ICD-10-CM | POA: Diagnosis not present

## 2021-05-29 DIAGNOSIS — Z483 Aftercare following surgery for neoplasm: Secondary | ICD-10-CM | POA: Diagnosis not present

## 2021-05-29 DIAGNOSIS — C679 Malignant neoplasm of bladder, unspecified: Secondary | ICD-10-CM | POA: Diagnosis not present

## 2021-05-29 DIAGNOSIS — Z87891 Personal history of nicotine dependence: Secondary | ICD-10-CM | POA: Diagnosis not present

## 2021-05-29 DIAGNOSIS — M542 Cervicalgia: Secondary | ICD-10-CM | POA: Diagnosis not present

## 2021-05-29 DIAGNOSIS — I809 Phlebitis and thrombophlebitis of unspecified site: Secondary | ICD-10-CM | POA: Diagnosis not present

## 2021-05-29 DIAGNOSIS — T451X5D Adverse effect of antineoplastic and immunosuppressive drugs, subsequent encounter: Secondary | ICD-10-CM | POA: Diagnosis not present

## 2021-05-29 DIAGNOSIS — Z906 Acquired absence of other parts of urinary tract: Secondary | ICD-10-CM | POA: Diagnosis not present

## 2021-05-29 DIAGNOSIS — E039 Hypothyroidism, unspecified: Secondary | ICD-10-CM | POA: Diagnosis not present

## 2021-05-29 DIAGNOSIS — Z436 Encounter for attention to other artificial openings of urinary tract: Secondary | ICD-10-CM | POA: Diagnosis not present

## 2021-06-01 DIAGNOSIS — Z436 Encounter for attention to other artificial openings of urinary tract: Secondary | ICD-10-CM | POA: Diagnosis not present

## 2021-06-01 DIAGNOSIS — M1990 Unspecified osteoarthritis, unspecified site: Secondary | ICD-10-CM | POA: Diagnosis not present

## 2021-06-01 DIAGNOSIS — Z466 Encounter for fitting and adjustment of urinary device: Secondary | ICD-10-CM | POA: Diagnosis not present

## 2021-06-01 DIAGNOSIS — E871 Hypo-osmolality and hyponatremia: Secondary | ICD-10-CM | POA: Diagnosis not present

## 2021-06-01 DIAGNOSIS — Z906 Acquired absence of other parts of urinary tract: Secondary | ICD-10-CM | POA: Diagnosis not present

## 2021-06-01 DIAGNOSIS — I809 Phlebitis and thrombophlebitis of unspecified site: Secondary | ICD-10-CM | POA: Diagnosis not present

## 2021-06-01 DIAGNOSIS — K219 Gastro-esophageal reflux disease without esophagitis: Secondary | ICD-10-CM | POA: Diagnosis not present

## 2021-06-01 DIAGNOSIS — G620 Drug-induced polyneuropathy: Secondary | ICD-10-CM | POA: Diagnosis not present

## 2021-06-01 DIAGNOSIS — M542 Cervicalgia: Secondary | ICD-10-CM | POA: Diagnosis not present

## 2021-06-01 DIAGNOSIS — E039 Hypothyroidism, unspecified: Secondary | ICD-10-CM | POA: Diagnosis not present

## 2021-06-01 DIAGNOSIS — Z87891 Personal history of nicotine dependence: Secondary | ICD-10-CM | POA: Diagnosis not present

## 2021-06-01 DIAGNOSIS — T451X5D Adverse effect of antineoplastic and immunosuppressive drugs, subsequent encounter: Secondary | ICD-10-CM | POA: Diagnosis not present

## 2021-06-01 DIAGNOSIS — Z483 Aftercare following surgery for neoplasm: Secondary | ICD-10-CM | POA: Diagnosis not present

## 2021-06-01 DIAGNOSIS — C679 Malignant neoplasm of bladder, unspecified: Secondary | ICD-10-CM | POA: Diagnosis not present

## 2021-06-04 DIAGNOSIS — Z436 Encounter for attention to other artificial openings of urinary tract: Secondary | ICD-10-CM | POA: Diagnosis not present

## 2021-06-04 DIAGNOSIS — G620 Drug-induced polyneuropathy: Secondary | ICD-10-CM | POA: Diagnosis not present

## 2021-06-04 DIAGNOSIS — K219 Gastro-esophageal reflux disease without esophagitis: Secondary | ICD-10-CM | POA: Diagnosis not present

## 2021-06-04 DIAGNOSIS — M542 Cervicalgia: Secondary | ICD-10-CM | POA: Diagnosis not present

## 2021-06-04 DIAGNOSIS — Z466 Encounter for fitting and adjustment of urinary device: Secondary | ICD-10-CM | POA: Diagnosis not present

## 2021-06-04 DIAGNOSIS — Z906 Acquired absence of other parts of urinary tract: Secondary | ICD-10-CM | POA: Diagnosis not present

## 2021-06-04 DIAGNOSIS — I809 Phlebitis and thrombophlebitis of unspecified site: Secondary | ICD-10-CM | POA: Diagnosis not present

## 2021-06-04 DIAGNOSIS — C679 Malignant neoplasm of bladder, unspecified: Secondary | ICD-10-CM | POA: Diagnosis not present

## 2021-06-04 DIAGNOSIS — M1990 Unspecified osteoarthritis, unspecified site: Secondary | ICD-10-CM | POA: Diagnosis not present

## 2021-06-04 DIAGNOSIS — E039 Hypothyroidism, unspecified: Secondary | ICD-10-CM | POA: Diagnosis not present

## 2021-06-04 DIAGNOSIS — E871 Hypo-osmolality and hyponatremia: Secondary | ICD-10-CM | POA: Diagnosis not present

## 2021-06-04 DIAGNOSIS — T451X5D Adverse effect of antineoplastic and immunosuppressive drugs, subsequent encounter: Secondary | ICD-10-CM | POA: Diagnosis not present

## 2021-06-04 DIAGNOSIS — Z483 Aftercare following surgery for neoplasm: Secondary | ICD-10-CM | POA: Diagnosis not present

## 2021-06-04 DIAGNOSIS — Z87891 Personal history of nicotine dependence: Secondary | ICD-10-CM | POA: Diagnosis not present

## 2021-06-06 DIAGNOSIS — M1990 Unspecified osteoarthritis, unspecified site: Secondary | ICD-10-CM | POA: Diagnosis not present

## 2021-06-06 DIAGNOSIS — Z906 Acquired absence of other parts of urinary tract: Secondary | ICD-10-CM | POA: Diagnosis not present

## 2021-06-06 DIAGNOSIS — K219 Gastro-esophageal reflux disease without esophagitis: Secondary | ICD-10-CM | POA: Diagnosis not present

## 2021-06-06 DIAGNOSIS — I809 Phlebitis and thrombophlebitis of unspecified site: Secondary | ICD-10-CM | POA: Diagnosis not present

## 2021-06-06 DIAGNOSIS — G620 Drug-induced polyneuropathy: Secondary | ICD-10-CM | POA: Diagnosis not present

## 2021-06-06 DIAGNOSIS — E871 Hypo-osmolality and hyponatremia: Secondary | ICD-10-CM | POA: Diagnosis not present

## 2021-06-06 DIAGNOSIS — C679 Malignant neoplasm of bladder, unspecified: Secondary | ICD-10-CM | POA: Diagnosis not present

## 2021-06-06 DIAGNOSIS — Z87891 Personal history of nicotine dependence: Secondary | ICD-10-CM | POA: Diagnosis not present

## 2021-06-06 DIAGNOSIS — E039 Hypothyroidism, unspecified: Secondary | ICD-10-CM | POA: Diagnosis not present

## 2021-06-06 DIAGNOSIS — Z483 Aftercare following surgery for neoplasm: Secondary | ICD-10-CM | POA: Diagnosis not present

## 2021-06-06 DIAGNOSIS — M542 Cervicalgia: Secondary | ICD-10-CM | POA: Diagnosis not present

## 2021-06-06 DIAGNOSIS — Z466 Encounter for fitting and adjustment of urinary device: Secondary | ICD-10-CM | POA: Diagnosis not present

## 2021-06-06 DIAGNOSIS — Z436 Encounter for attention to other artificial openings of urinary tract: Secondary | ICD-10-CM | POA: Diagnosis not present

## 2021-06-06 DIAGNOSIS — T451X5D Adverse effect of antineoplastic and immunosuppressive drugs, subsequent encounter: Secondary | ICD-10-CM | POA: Diagnosis not present

## 2021-06-07 DIAGNOSIS — R8271 Bacteriuria: Secondary | ICD-10-CM | POA: Diagnosis not present

## 2021-06-08 DIAGNOSIS — Z906 Acquired absence of other parts of urinary tract: Secondary | ICD-10-CM | POA: Diagnosis not present

## 2021-06-08 DIAGNOSIS — M1990 Unspecified osteoarthritis, unspecified site: Secondary | ICD-10-CM | POA: Diagnosis not present

## 2021-06-08 DIAGNOSIS — K219 Gastro-esophageal reflux disease without esophagitis: Secondary | ICD-10-CM | POA: Diagnosis not present

## 2021-06-08 DIAGNOSIS — Z466 Encounter for fitting and adjustment of urinary device: Secondary | ICD-10-CM | POA: Diagnosis not present

## 2021-06-08 DIAGNOSIS — E039 Hypothyroidism, unspecified: Secondary | ICD-10-CM | POA: Diagnosis not present

## 2021-06-08 DIAGNOSIS — M542 Cervicalgia: Secondary | ICD-10-CM | POA: Diagnosis not present

## 2021-06-08 DIAGNOSIS — Z87891 Personal history of nicotine dependence: Secondary | ICD-10-CM | POA: Diagnosis not present

## 2021-06-08 DIAGNOSIS — C679 Malignant neoplasm of bladder, unspecified: Secondary | ICD-10-CM | POA: Diagnosis not present

## 2021-06-08 DIAGNOSIS — G620 Drug-induced polyneuropathy: Secondary | ICD-10-CM | POA: Diagnosis not present

## 2021-06-08 DIAGNOSIS — Z436 Encounter for attention to other artificial openings of urinary tract: Secondary | ICD-10-CM | POA: Diagnosis not present

## 2021-06-08 DIAGNOSIS — Z483 Aftercare following surgery for neoplasm: Secondary | ICD-10-CM | POA: Diagnosis not present

## 2021-06-08 DIAGNOSIS — T451X5D Adverse effect of antineoplastic and immunosuppressive drugs, subsequent encounter: Secondary | ICD-10-CM | POA: Diagnosis not present

## 2021-06-08 DIAGNOSIS — I809 Phlebitis and thrombophlebitis of unspecified site: Secondary | ICD-10-CM | POA: Diagnosis not present

## 2021-06-08 DIAGNOSIS — E871 Hypo-osmolality and hyponatremia: Secondary | ICD-10-CM | POA: Diagnosis not present

## 2021-06-09 DIAGNOSIS — Z436 Encounter for attention to other artificial openings of urinary tract: Secondary | ICD-10-CM | POA: Diagnosis not present

## 2021-06-09 DIAGNOSIS — E871 Hypo-osmolality and hyponatremia: Secondary | ICD-10-CM | POA: Diagnosis not present

## 2021-06-09 DIAGNOSIS — M1990 Unspecified osteoarthritis, unspecified site: Secondary | ICD-10-CM | POA: Diagnosis not present

## 2021-06-09 DIAGNOSIS — M542 Cervicalgia: Secondary | ICD-10-CM | POA: Diagnosis not present

## 2021-06-09 DIAGNOSIS — Z466 Encounter for fitting and adjustment of urinary device: Secondary | ICD-10-CM | POA: Diagnosis not present

## 2021-06-09 DIAGNOSIS — E785 Hyperlipidemia, unspecified: Secondary | ICD-10-CM | POA: Diagnosis not present

## 2021-06-09 DIAGNOSIS — G620 Drug-induced polyneuropathy: Secondary | ICD-10-CM | POA: Diagnosis not present

## 2021-06-09 DIAGNOSIS — E039 Hypothyroidism, unspecified: Secondary | ICD-10-CM | POA: Diagnosis not present

## 2021-06-09 DIAGNOSIS — K219 Gastro-esophageal reflux disease without esophagitis: Secondary | ICD-10-CM | POA: Diagnosis not present

## 2021-06-09 DIAGNOSIS — I809 Phlebitis and thrombophlebitis of unspecified site: Secondary | ICD-10-CM | POA: Diagnosis not present

## 2021-06-09 DIAGNOSIS — C679 Malignant neoplasm of bladder, unspecified: Secondary | ICD-10-CM | POA: Diagnosis not present

## 2021-06-09 DIAGNOSIS — Z87891 Personal history of nicotine dependence: Secondary | ICD-10-CM | POA: Diagnosis not present

## 2021-06-09 DIAGNOSIS — Z906 Acquired absence of other parts of urinary tract: Secondary | ICD-10-CM | POA: Diagnosis not present

## 2021-06-09 DIAGNOSIS — T451X5D Adverse effect of antineoplastic and immunosuppressive drugs, subsequent encounter: Secondary | ICD-10-CM | POA: Diagnosis not present

## 2021-06-09 DIAGNOSIS — Z483 Aftercare following surgery for neoplasm: Secondary | ICD-10-CM | POA: Diagnosis not present

## 2021-06-15 DIAGNOSIS — I809 Phlebitis and thrombophlebitis of unspecified site: Secondary | ICD-10-CM | POA: Diagnosis not present

## 2021-06-15 DIAGNOSIS — M1990 Unspecified osteoarthritis, unspecified site: Secondary | ICD-10-CM | POA: Diagnosis not present

## 2021-06-15 DIAGNOSIS — Z436 Encounter for attention to other artificial openings of urinary tract: Secondary | ICD-10-CM | POA: Diagnosis not present

## 2021-06-15 DIAGNOSIS — M542 Cervicalgia: Secondary | ICD-10-CM | POA: Diagnosis not present

## 2021-06-15 DIAGNOSIS — T451X5D Adverse effect of antineoplastic and immunosuppressive drugs, subsequent encounter: Secondary | ICD-10-CM | POA: Diagnosis not present

## 2021-06-15 DIAGNOSIS — Z87891 Personal history of nicotine dependence: Secondary | ICD-10-CM | POA: Diagnosis not present

## 2021-06-15 DIAGNOSIS — E871 Hypo-osmolality and hyponatremia: Secondary | ICD-10-CM | POA: Diagnosis not present

## 2021-06-15 DIAGNOSIS — Z906 Acquired absence of other parts of urinary tract: Secondary | ICD-10-CM | POA: Diagnosis not present

## 2021-06-15 DIAGNOSIS — Z466 Encounter for fitting and adjustment of urinary device: Secondary | ICD-10-CM | POA: Diagnosis not present

## 2021-06-15 DIAGNOSIS — Z483 Aftercare following surgery for neoplasm: Secondary | ICD-10-CM | POA: Diagnosis not present

## 2021-06-15 DIAGNOSIS — C679 Malignant neoplasm of bladder, unspecified: Secondary | ICD-10-CM | POA: Diagnosis not present

## 2021-06-15 DIAGNOSIS — G620 Drug-induced polyneuropathy: Secondary | ICD-10-CM | POA: Diagnosis not present

## 2021-06-15 DIAGNOSIS — K219 Gastro-esophageal reflux disease without esophagitis: Secondary | ICD-10-CM | POA: Diagnosis not present

## 2021-06-15 DIAGNOSIS — E039 Hypothyroidism, unspecified: Secondary | ICD-10-CM | POA: Diagnosis not present

## 2021-06-17 DIAGNOSIS — K219 Gastro-esophageal reflux disease without esophagitis: Secondary | ICD-10-CM | POA: Diagnosis not present

## 2021-06-17 DIAGNOSIS — Z483 Aftercare following surgery for neoplasm: Secondary | ICD-10-CM | POA: Diagnosis not present

## 2021-06-17 DIAGNOSIS — Z436 Encounter for attention to other artificial openings of urinary tract: Secondary | ICD-10-CM | POA: Diagnosis not present

## 2021-06-17 DIAGNOSIS — C679 Malignant neoplasm of bladder, unspecified: Secondary | ICD-10-CM | POA: Diagnosis not present

## 2021-06-17 DIAGNOSIS — E871 Hypo-osmolality and hyponatremia: Secondary | ICD-10-CM | POA: Diagnosis not present

## 2021-06-17 DIAGNOSIS — T451X5D Adverse effect of antineoplastic and immunosuppressive drugs, subsequent encounter: Secondary | ICD-10-CM | POA: Diagnosis not present

## 2021-06-17 DIAGNOSIS — M1990 Unspecified osteoarthritis, unspecified site: Secondary | ICD-10-CM | POA: Diagnosis not present

## 2021-06-17 DIAGNOSIS — G620 Drug-induced polyneuropathy: Secondary | ICD-10-CM | POA: Diagnosis not present

## 2021-06-17 DIAGNOSIS — I809 Phlebitis and thrombophlebitis of unspecified site: Secondary | ICD-10-CM | POA: Diagnosis not present

## 2021-06-17 DIAGNOSIS — E039 Hypothyroidism, unspecified: Secondary | ICD-10-CM | POA: Diagnosis not present

## 2021-06-17 DIAGNOSIS — M542 Cervicalgia: Secondary | ICD-10-CM | POA: Diagnosis not present

## 2021-06-17 DIAGNOSIS — Z906 Acquired absence of other parts of urinary tract: Secondary | ICD-10-CM | POA: Diagnosis not present

## 2021-06-17 DIAGNOSIS — Z466 Encounter for fitting and adjustment of urinary device: Secondary | ICD-10-CM | POA: Diagnosis not present

## 2021-06-17 DIAGNOSIS — Z87891 Personal history of nicotine dependence: Secondary | ICD-10-CM | POA: Diagnosis not present

## 2021-06-20 DIAGNOSIS — Z483 Aftercare following surgery for neoplasm: Secondary | ICD-10-CM | POA: Diagnosis not present

## 2021-06-20 DIAGNOSIS — M1990 Unspecified osteoarthritis, unspecified site: Secondary | ICD-10-CM | POA: Diagnosis not present

## 2021-06-20 DIAGNOSIS — E039 Hypothyroidism, unspecified: Secondary | ICD-10-CM | POA: Diagnosis not present

## 2021-06-20 DIAGNOSIS — G620 Drug-induced polyneuropathy: Secondary | ICD-10-CM | POA: Diagnosis not present

## 2021-06-20 DIAGNOSIS — Z466 Encounter for fitting and adjustment of urinary device: Secondary | ICD-10-CM | POA: Diagnosis not present

## 2021-06-20 DIAGNOSIS — I809 Phlebitis and thrombophlebitis of unspecified site: Secondary | ICD-10-CM | POA: Diagnosis not present

## 2021-06-20 DIAGNOSIS — Z87891 Personal history of nicotine dependence: Secondary | ICD-10-CM | POA: Diagnosis not present

## 2021-06-20 DIAGNOSIS — T451X5D Adverse effect of antineoplastic and immunosuppressive drugs, subsequent encounter: Secondary | ICD-10-CM | POA: Diagnosis not present

## 2021-06-20 DIAGNOSIS — M542 Cervicalgia: Secondary | ICD-10-CM | POA: Diagnosis not present

## 2021-06-20 DIAGNOSIS — K219 Gastro-esophageal reflux disease without esophagitis: Secondary | ICD-10-CM | POA: Diagnosis not present

## 2021-06-20 DIAGNOSIS — Z436 Encounter for attention to other artificial openings of urinary tract: Secondary | ICD-10-CM | POA: Diagnosis not present

## 2021-06-20 DIAGNOSIS — Z906 Acquired absence of other parts of urinary tract: Secondary | ICD-10-CM | POA: Diagnosis not present

## 2021-06-20 DIAGNOSIS — E871 Hypo-osmolality and hyponatremia: Secondary | ICD-10-CM | POA: Diagnosis not present

## 2021-06-20 DIAGNOSIS — C679 Malignant neoplasm of bladder, unspecified: Secondary | ICD-10-CM | POA: Diagnosis not present

## 2021-06-21 DIAGNOSIS — M542 Cervicalgia: Secondary | ICD-10-CM | POA: Diagnosis not present

## 2021-06-21 DIAGNOSIS — E039 Hypothyroidism, unspecified: Secondary | ICD-10-CM | POA: Diagnosis not present

## 2021-06-21 DIAGNOSIS — E871 Hypo-osmolality and hyponatremia: Secondary | ICD-10-CM | POA: Diagnosis not present

## 2021-06-21 DIAGNOSIS — Z906 Acquired absence of other parts of urinary tract: Secondary | ICD-10-CM | POA: Diagnosis not present

## 2021-06-21 DIAGNOSIS — C679 Malignant neoplasm of bladder, unspecified: Secondary | ICD-10-CM | POA: Diagnosis not present

## 2021-06-21 DIAGNOSIS — Z436 Encounter for attention to other artificial openings of urinary tract: Secondary | ICD-10-CM | POA: Diagnosis not present

## 2021-06-21 DIAGNOSIS — M1990 Unspecified osteoarthritis, unspecified site: Secondary | ICD-10-CM | POA: Diagnosis not present

## 2021-06-21 DIAGNOSIS — K219 Gastro-esophageal reflux disease without esophagitis: Secondary | ICD-10-CM | POA: Diagnosis not present

## 2021-06-21 DIAGNOSIS — G620 Drug-induced polyneuropathy: Secondary | ICD-10-CM | POA: Diagnosis not present

## 2021-06-21 DIAGNOSIS — Z466 Encounter for fitting and adjustment of urinary device: Secondary | ICD-10-CM | POA: Diagnosis not present

## 2021-06-21 DIAGNOSIS — Z483 Aftercare following surgery for neoplasm: Secondary | ICD-10-CM | POA: Diagnosis not present

## 2021-06-21 DIAGNOSIS — I809 Phlebitis and thrombophlebitis of unspecified site: Secondary | ICD-10-CM | POA: Diagnosis not present

## 2021-06-21 DIAGNOSIS — T451X5D Adverse effect of antineoplastic and immunosuppressive drugs, subsequent encounter: Secondary | ICD-10-CM | POA: Diagnosis not present

## 2021-06-21 DIAGNOSIS — Z87891 Personal history of nicotine dependence: Secondary | ICD-10-CM | POA: Diagnosis not present

## 2021-06-24 DIAGNOSIS — M1990 Unspecified osteoarthritis, unspecified site: Secondary | ICD-10-CM | POA: Diagnosis not present

## 2021-06-24 DIAGNOSIS — Z466 Encounter for fitting and adjustment of urinary device: Secondary | ICD-10-CM | POA: Diagnosis not present

## 2021-06-24 DIAGNOSIS — Z906 Acquired absence of other parts of urinary tract: Secondary | ICD-10-CM | POA: Diagnosis not present

## 2021-06-24 DIAGNOSIS — Z87891 Personal history of nicotine dependence: Secondary | ICD-10-CM | POA: Diagnosis not present

## 2021-06-24 DIAGNOSIS — E039 Hypothyroidism, unspecified: Secondary | ICD-10-CM | POA: Diagnosis not present

## 2021-06-24 DIAGNOSIS — T451X5D Adverse effect of antineoplastic and immunosuppressive drugs, subsequent encounter: Secondary | ICD-10-CM | POA: Diagnosis not present

## 2021-06-24 DIAGNOSIS — M542 Cervicalgia: Secondary | ICD-10-CM | POA: Diagnosis not present

## 2021-06-24 DIAGNOSIS — E871 Hypo-osmolality and hyponatremia: Secondary | ICD-10-CM | POA: Diagnosis not present

## 2021-06-24 DIAGNOSIS — I809 Phlebitis and thrombophlebitis of unspecified site: Secondary | ICD-10-CM | POA: Diagnosis not present

## 2021-06-24 DIAGNOSIS — G620 Drug-induced polyneuropathy: Secondary | ICD-10-CM | POA: Diagnosis not present

## 2021-06-24 DIAGNOSIS — C679 Malignant neoplasm of bladder, unspecified: Secondary | ICD-10-CM | POA: Diagnosis not present

## 2021-06-24 DIAGNOSIS — Z436 Encounter for attention to other artificial openings of urinary tract: Secondary | ICD-10-CM | POA: Diagnosis not present

## 2021-06-24 DIAGNOSIS — Z483 Aftercare following surgery for neoplasm: Secondary | ICD-10-CM | POA: Diagnosis not present

## 2021-06-24 DIAGNOSIS — K219 Gastro-esophageal reflux disease without esophagitis: Secondary | ICD-10-CM | POA: Diagnosis not present

## 2021-06-30 DIAGNOSIS — Z906 Acquired absence of other parts of urinary tract: Secondary | ICD-10-CM | POA: Diagnosis not present

## 2021-06-30 DIAGNOSIS — M542 Cervicalgia: Secondary | ICD-10-CM | POA: Diagnosis not present

## 2021-06-30 DIAGNOSIS — T451X5D Adverse effect of antineoplastic and immunosuppressive drugs, subsequent encounter: Secondary | ICD-10-CM | POA: Diagnosis not present

## 2021-06-30 DIAGNOSIS — Z483 Aftercare following surgery for neoplasm: Secondary | ICD-10-CM | POA: Diagnosis not present

## 2021-06-30 DIAGNOSIS — G620 Drug-induced polyneuropathy: Secondary | ICD-10-CM | POA: Diagnosis not present

## 2021-06-30 DIAGNOSIS — Z466 Encounter for fitting and adjustment of urinary device: Secondary | ICD-10-CM | POA: Diagnosis not present

## 2021-06-30 DIAGNOSIS — K219 Gastro-esophageal reflux disease without esophagitis: Secondary | ICD-10-CM | POA: Diagnosis not present

## 2021-06-30 DIAGNOSIS — I809 Phlebitis and thrombophlebitis of unspecified site: Secondary | ICD-10-CM | POA: Diagnosis not present

## 2021-06-30 DIAGNOSIS — Z87891 Personal history of nicotine dependence: Secondary | ICD-10-CM | POA: Diagnosis not present

## 2021-06-30 DIAGNOSIS — C679 Malignant neoplasm of bladder, unspecified: Secondary | ICD-10-CM | POA: Diagnosis not present

## 2021-06-30 DIAGNOSIS — Z436 Encounter for attention to other artificial openings of urinary tract: Secondary | ICD-10-CM | POA: Diagnosis not present

## 2021-06-30 DIAGNOSIS — E039 Hypothyroidism, unspecified: Secondary | ICD-10-CM | POA: Diagnosis not present

## 2021-06-30 DIAGNOSIS — E871 Hypo-osmolality and hyponatremia: Secondary | ICD-10-CM | POA: Diagnosis not present

## 2021-06-30 DIAGNOSIS — M1990 Unspecified osteoarthritis, unspecified site: Secondary | ICD-10-CM | POA: Diagnosis not present

## 2021-07-01 DIAGNOSIS — Z23 Encounter for immunization: Secondary | ICD-10-CM | POA: Diagnosis not present

## 2021-07-01 DIAGNOSIS — Z906 Acquired absence of other parts of urinary tract: Secondary | ICD-10-CM | POA: Diagnosis not present

## 2021-07-01 DIAGNOSIS — M1990 Unspecified osteoarthritis, unspecified site: Secondary | ICD-10-CM | POA: Diagnosis not present

## 2021-07-01 DIAGNOSIS — I809 Phlebitis and thrombophlebitis of unspecified site: Secondary | ICD-10-CM | POA: Diagnosis not present

## 2021-07-01 DIAGNOSIS — Z436 Encounter for attention to other artificial openings of urinary tract: Secondary | ICD-10-CM | POA: Diagnosis not present

## 2021-07-01 DIAGNOSIS — C679 Malignant neoplasm of bladder, unspecified: Secondary | ICD-10-CM | POA: Diagnosis not present

## 2021-07-01 DIAGNOSIS — M542 Cervicalgia: Secondary | ICD-10-CM | POA: Diagnosis not present

## 2021-07-01 DIAGNOSIS — E039 Hypothyroidism, unspecified: Secondary | ICD-10-CM | POA: Diagnosis not present

## 2021-07-01 DIAGNOSIS — Z87891 Personal history of nicotine dependence: Secondary | ICD-10-CM | POA: Diagnosis not present

## 2021-07-01 DIAGNOSIS — Z466 Encounter for fitting and adjustment of urinary device: Secondary | ICD-10-CM | POA: Diagnosis not present

## 2021-07-01 DIAGNOSIS — E871 Hypo-osmolality and hyponatremia: Secondary | ICD-10-CM | POA: Diagnosis not present

## 2021-07-01 DIAGNOSIS — Z483 Aftercare following surgery for neoplasm: Secondary | ICD-10-CM | POA: Diagnosis not present

## 2021-07-01 DIAGNOSIS — K219 Gastro-esophageal reflux disease without esophagitis: Secondary | ICD-10-CM | POA: Diagnosis not present

## 2021-07-01 DIAGNOSIS — T451X5D Adverse effect of antineoplastic and immunosuppressive drugs, subsequent encounter: Secondary | ICD-10-CM | POA: Diagnosis not present

## 2021-07-01 DIAGNOSIS — G620 Drug-induced polyneuropathy: Secondary | ICD-10-CM | POA: Diagnosis not present

## 2021-07-09 DIAGNOSIS — H2513 Age-related nuclear cataract, bilateral: Secondary | ICD-10-CM | POA: Diagnosis not present

## 2021-07-09 DIAGNOSIS — H43393 Other vitreous opacities, bilateral: Secondary | ICD-10-CM | POA: Diagnosis not present

## 2021-07-09 DIAGNOSIS — H524 Presbyopia: Secondary | ICD-10-CM | POA: Diagnosis not present

## 2021-08-01 DIAGNOSIS — Z936 Other artificial openings of urinary tract status: Secondary | ICD-10-CM | POA: Diagnosis not present

## 2021-08-19 ENCOUNTER — Encounter: Payer: Self-pay | Admitting: Oncology

## 2021-08-26 ENCOUNTER — Other Ambulatory Visit: Payer: Self-pay

## 2021-08-26 ENCOUNTER — Inpatient Hospital Stay: Payer: 59 | Attending: Oncology | Admitting: Oncology

## 2021-08-26 DIAGNOSIS — C678 Malignant neoplasm of overlapping sites of bladder: Secondary | ICD-10-CM | POA: Diagnosis not present

## 2021-08-26 NOTE — Progress Notes (Signed)
Hematology and Oncology Follow Up for Telemedicine Visits  Lance Friedman [id number] 1946-06-28 75 y.o. 08/26/2021 10:24 AM [name], MDGates, [name], MD   I connected with Lance Friedman on 08/26/21 at 10:30 AM EST by telephone visit and verified that I am speaking with the correct person using two identifiers.   I discussed the limitations, risks, security and privacy concerns of performing an evaluation and management service by telemedicine and the availability of in-person appointments. I also discussed with the patient that there may be a patient responsible charge related to this service. The patient expressed understanding and agreed to proceed.  Other persons participating in the visit and their role in the encounter: None  Patient's location: Home Provider's location: Office    Principle Diagnosis: 75 year old with bladder cancer diagnosed in April 2022.  He was found to have T2N0 high-grade urothelial carcinoma.   Prior Therapy:  He is status post TURBT on December 21, 2020 with the pathology showed high-grade urothelial carcinoma with muscle invasion.   Neoadjuvant chemotherapy utilizing gemcitabine and cisplatin started on Jan 20, 2021.  He completed 4 cycles of therapy on March 31, 2021.  He is status post robotic assisted laparoscopic cystoprostatectomy completed on May 18, 2021.  The final pathology showed residual high-grade urothelial carcinoma with squamous differentiation involving a bladder diverticulum.  Indicating T2b tumor.  0 out of 13 lymph nodes involved.   Current therapy: Active surveillance.  Interim History: [name] reports feeling well and continues to improve from his surgery.  He has relocated to the [employer] in the [place] and establish care with a primary care and upcoming appointment with urologist.  He denies any nausea, vomiting or abdominal pain.  He denies any recent hospitalizations or illnesses.  He denies any falls or  syncope.  He has regained all activities of related living.     Medications: I have reviewed the patient's current medications.  Current Outpatient Medications  Medication Sig Dispense Refill   acetaminophen (TYLENOL) 500 MG tablet Take 1,000 mg by mouth in the morning and at bedtime.     Coenzyme Q10 100 MG capsule Take 100 mg by mouth daily. (Patient not taking: Reported on 05/17/2021)     diclofenac Sodium (VOLTAREN) 1 % GEL Apply 1 application topically daily as needed (pain). (Patient not taking: Reported on 05/17/2021)     famotidine (PEPCID) 20 MG tablet Take 20 mg by mouth daily as needed for heartburn.     GINSENG PO Take 1-2 capsules by mouth daily. 400 mg per cap, American Ginseng (Patient not taking: Reported on 05/17/2021)     [place] Ginseng 100 MG CAPS Take 100 mg by mouth daily. (Patient not taking: Reported on 05/17/2021)     levothyroxine (SYNTHROID) 112 MCG tablet Take 112 mcg by mouth daily before breakfast.     Multiple Vitamin (MULTIVITAMIN) tablet Take 1 tablet by mouth daily. (Patient not taking: Reported on 05/17/2021)     neomycin-bacitracin-polymyxin (NEOSPORIN) ointment Apply 1 application topically 2 (two) times daily as needed for wound care ((affected sites)).     NON FORMULARY Take 2 tablets by mouth See admin instructions. Achanesia Armandina Gemma Seal tablets- Take 2 tablets by mouth once a day (Patient not taking: Reported on 05/17/2021)     omeprazole (PRILOSEC) 20 MG capsule Take 40 mg by mouth every morning.     OVER THE COUNTER MEDICATION Take 3 tablets by mouth daily. Calcium magnesium zinc 3 tabs daily (Patient not taking: Reported on 05/17/2021)  oxyCODONE-acetaminophen (PERCOCET) 5-325 MG tablet Take 1 tablet by mouth every 6 (six) hours as needed for severe pain or moderate pain. 20 tablet 0   pravastatin (PRAVACHOL) 20 MG tablet Take 20 mg by mouth at bedtime.      prochlorperazine (COMPAZINE) 10 MG tablet Take 1 tablet (10 mg total) by mouth every 6 (six) hours  as needed for nausea or vomiting. (Patient not taking: Reported on 05/17/2021) 30 tablet 0   vitamin C (ASCORBIC ACID) 500 MG tablet Take 1,000 mg by mouth daily. Chewables (Patient not taking: Reported on 05/17/2021)     Vitamin E 670 MG (1000 UT) CAPS Take 1,000 Units by mouth daily. 1000 mg daily (Patient not taking: Reported on 05/17/2021)     No current facility-administered medications for this visit.     Allergies:  Allergies  Allergen Reactions   Aleve [Naproxen] Other (See Comments)     Cannot take Naproxen sodium due to past stomach damage    Aspirin Other (See Comments)    History of stomach bleeds   Motrin [Ibuprofen] Other (See Comments)    History of stomach bleeds   Shellfish-Derived Products Swelling and Other (See Comments)    Caused joint swelling       Lab Results: Lab Results  Component Value Date   WBC 10.5 05/25/2021   HGB 10.5 (L) 05/25/2021   HCT 30.3 (L) 05/25/2021   MCV 91.5 05/25/2021   PLT 282 05/25/2021     Chemistry      Component Value Date/Time   NA 127 (L) 05/26/2021 1423   K 3.7 05/26/2021 0639   CL 101 05/26/2021 0639   CO2 25 05/26/2021 0639   BUN 12 05/26/2021 0639   CREATININE 0.79 05/26/2021 0639   CREATININE 0.87 04/22/2021 1106      Component Value Date/Time   CALCIUM 8.2 (L) 05/26/2021 0639   ALKPHOS 40 05/24/2021 0240   AST 22 05/24/2021 0240   AST 21 04/22/2021 1106   ALT 15 05/24/2021 0240   ALT 19 04/22/2021 1106   BILITOT 0.6 05/24/2021 0240   BILITOT 0.2 (L) 04/22/2021 1106          Impression and Plan:  75 year old man with:   1.  T2BN0 high-grade urothelial carcinoma of the bladder diagnosed in April 2022.     His disease status was updated at this time and treatment choices were reviewed.  He is status post surgical resection after neoadjuvant chemotherapy.  Risk of relapse and additional therapy were discussed at this time.  I recommended active surveillance rather than adjuvant immunotherapy at this  time.  We will continue to have imaging studies surveillance under the care of his local urologist.     2.  Renal function surveillance: His kidney function remained intact after platinum therapy.   3.  Neutropenia: Resolved after the conclusion of chemotherapy.     4.  Follow-up: As needed in the future.                I discussed the assessment and treatment plan with the patient. The patient was provided an opportunity to ask questions and all were answered. The patient agreed with the plan and demonstrated an understanding of the instructions.   The patient was advised to call back or seek an in-person evaluation if the symptoms worsen or if the condition fails to improve as anticipated.  I provided 20 minutes of non face-to-face telephone visit time during this encounter.  The time was dedicated  to reviewing pathology results, laboratory testing and future plan of care discussion.  Zola Button, MD 08/26/2021 10:24 AM

## 2021-11-02 IMAGING — CT CT CHEST-ABD-PELV W/ CM
3 of 12 series · 9 of 46 positions shown, 15 images · IV contrast (APPLIED)
Comparison: 11/25/2020

CLINICAL DATA: Bladder cancer.  Restaging.

EXAM:
CT CHEST, ABDOMEN, AND PELVIS WITH CONTRAST
TECHNIQUE: Multidetector CT imaging of the chest, abdomen and pelvis was
performed following the standard protocol during bolus
administration of intravenous contrast.
CONTRAST:  80mL OMNIPAQUE IOHEXOL 350 MG/ML SOLN

[Series 2: axial post · axial · 0.84mm/px · z∈[+1192,+1672]mm · 5 of 145 slices shown, 10 images]
[im 25/145  soft-tissue]
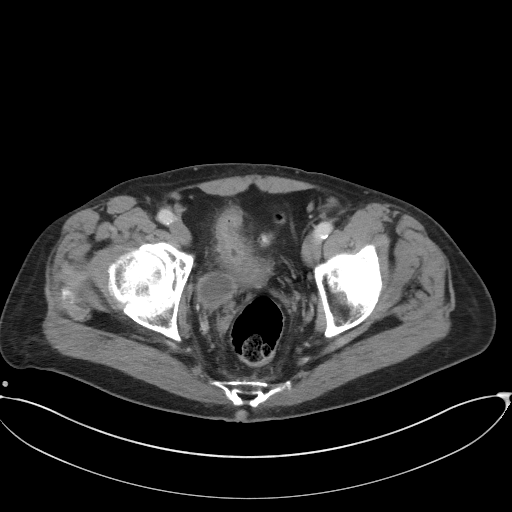
[im 25/145  bone]
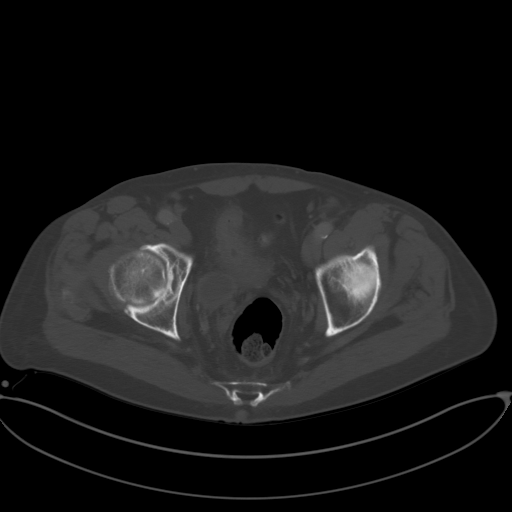
[im 49/145  soft-tissue]
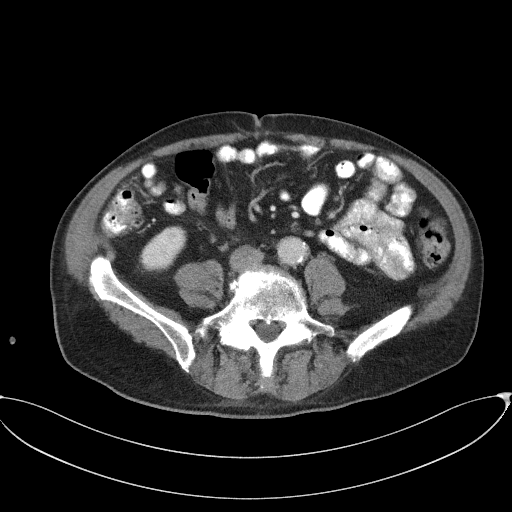
[im 49/145  lung]
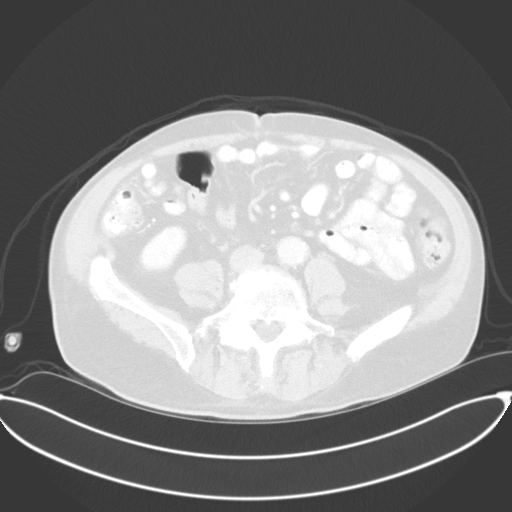
[im 73/145  soft-tissue]
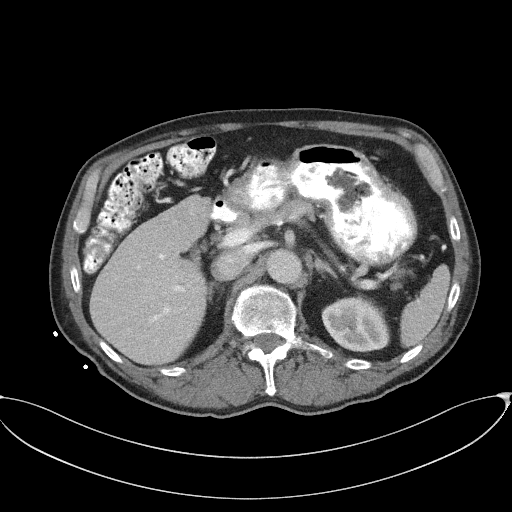
[im 73/145  lung]
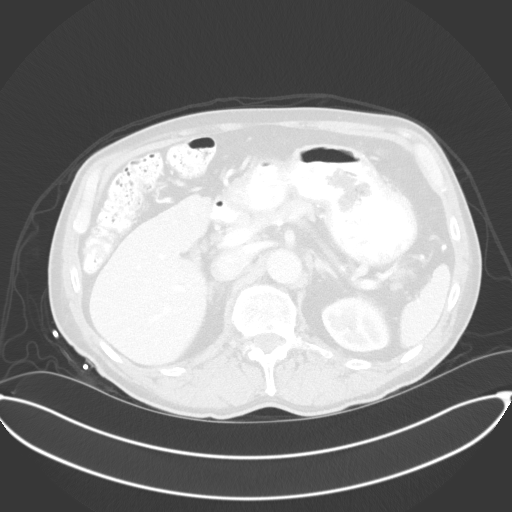
[im 97/145  soft-tissue]
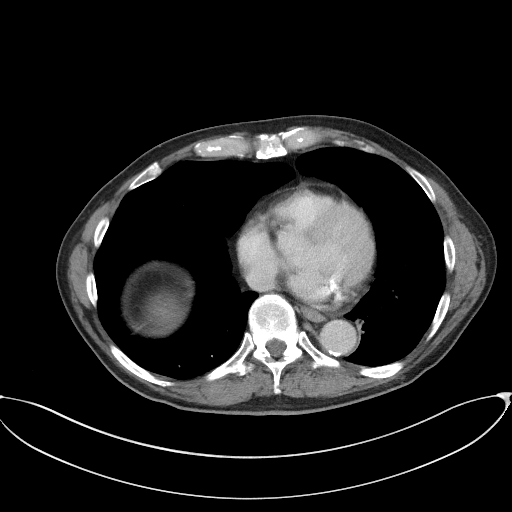
[im 97/145  lung]
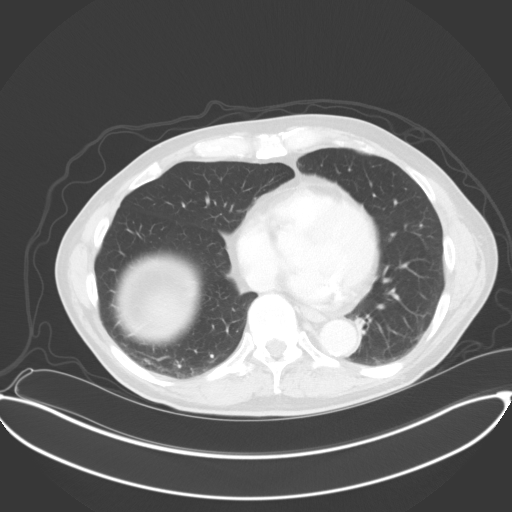
[im 121/145  soft-tissue]
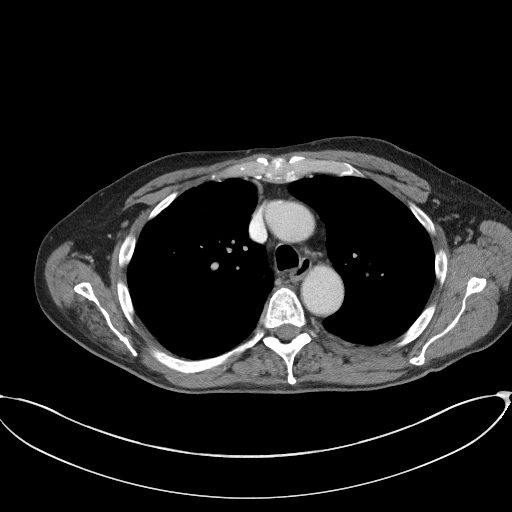
[im 121/145  lung]
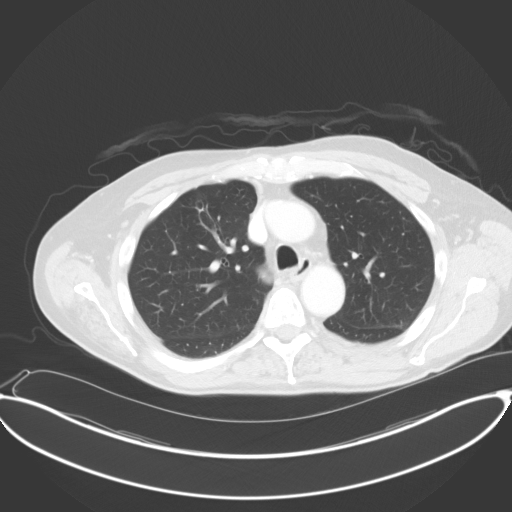

[Series 5: coronal post · coronal · 0.87mm/px · 2 of 106 slices shown, 3 images]
[im 36/106  soft-tissue]
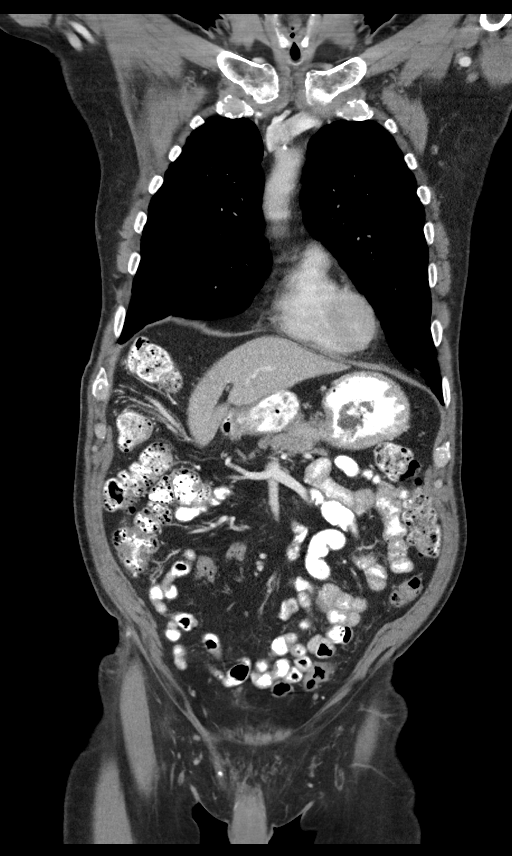
[im 36/106  bone]
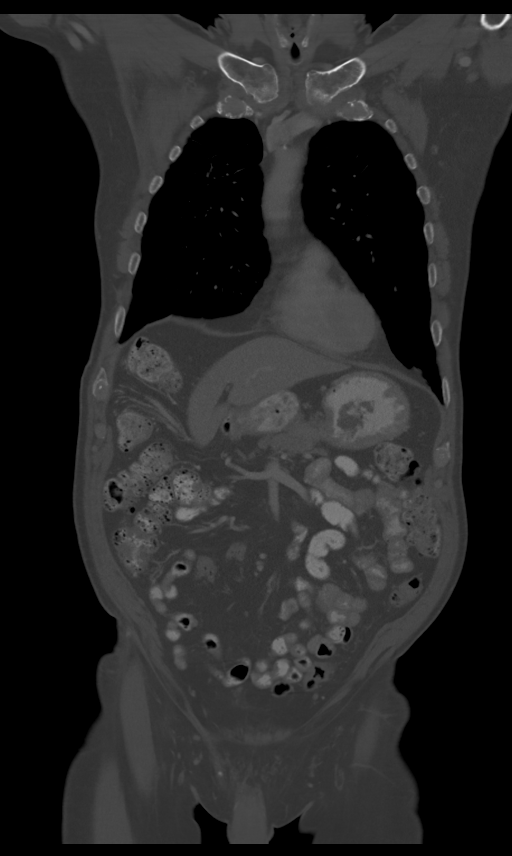
[im 71/106  soft-tissue]
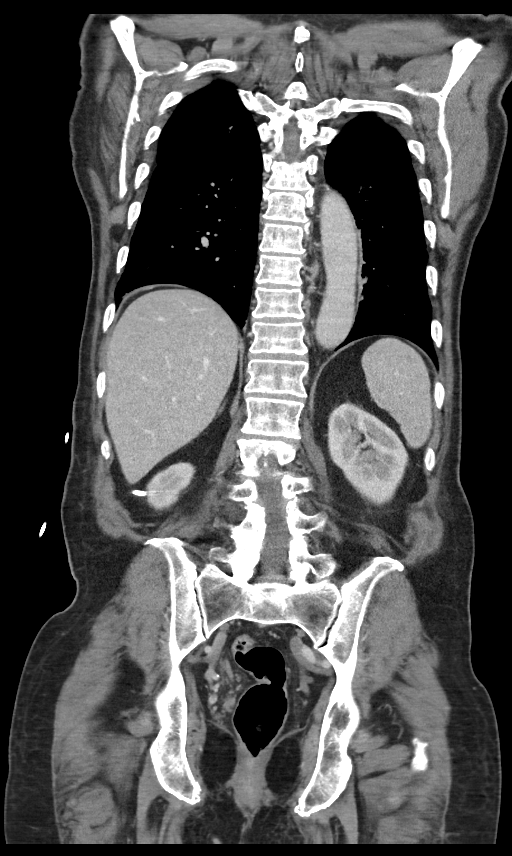

[Series 8: axial delay · axial · delayed · 0.82mm/px · z∈[+1336,+1510]mm · 2 of 106 slices shown]
[im 36/106  soft-tissue]
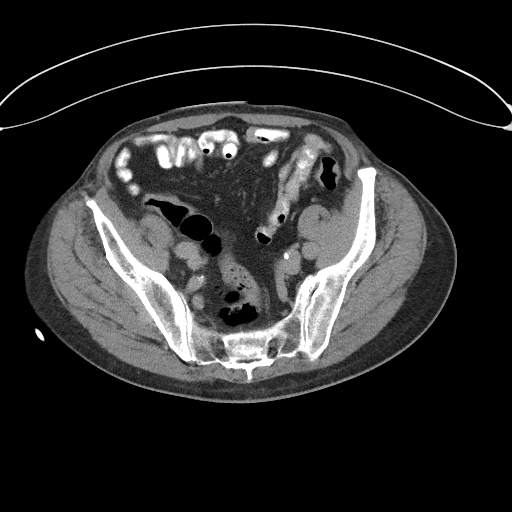
[im 71/106  soft-tissue]
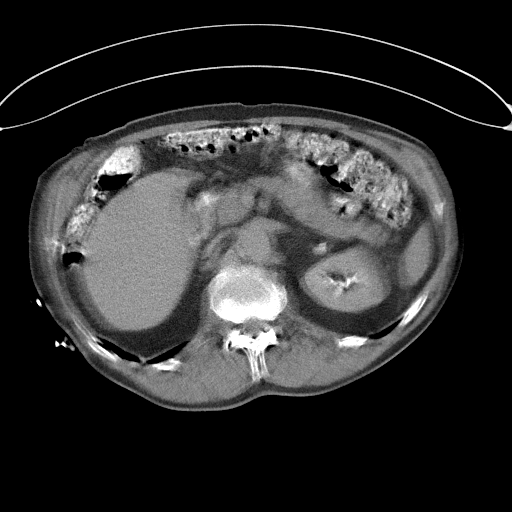

[9 of 46 positions shown; findings below may reference images not displayed]

FINDINGS: CT CHEST FINDINGS

Cardiovascular: The heart size is normal. No substantial pericardial
effusion. Coronary artery calcification is evident. Mitral annular
calcification noted. Mild atherosclerotic calcification is noted in
the wall of the thoracic aorta.

Mediastinum/Nodes: No mediastinal lymphadenopathy. There is no hilar
lymphadenopathy. The esophagus has normal imaging features. There is
no axillary lymphadenopathy.

Lungs/Pleura: Architectural distortion/scarring noted anterior right
upper lobe. 12 mm nodule identified deep posterior right
costophrenic sulcus on image 132/4. This is in an area of
compressive atelectasis and may be atelectatic. No suspicious
pulmonary nodule or mass. No focal airspace consolidation. No
pleural effusion.

Musculoskeletal: No worrisome lytic or sclerotic osseous
abnormality. Degenerative changes noted both shoulders.

CT ABDOMEN PELVIS FINDINGS

Hepatobiliary: No suspicious focal abnormality within the liver
parenchyma. Layering tiny calcified gallstones evident. No
intrahepatic or extrahepatic biliary dilation.

Pancreas: No focal mass lesion. No dilatation of the main duct. No
intraparenchymal cyst. No peripancreatic edema.

Spleen: No splenomegaly. No focal mass lesion.

Adrenals/Urinary Tract: No adrenal nodule or mass. Right
percutaneous nephrostomy tube noted with tip formed in the region of
the central renal pelvis. No hydronephrosis. Left kidney
unremarkable. No evidence for hydroureter.

Bladder is decompressed with apparent diffuse bladder wall
thickening. The right-sided enhancing bladder mass seen on the
previous study is not well demonstrated today although a right-sided
bladder diverticulum persists, smaller than before.

Stomach/Bowel: Stomach is unremarkable. No gastric wall thickening.
No evidence of outlet obstruction. Duodenum is normally positioned
as is the ligament of Treitz. No small bowel wall thickening. No
small bowel dilatation. The terminal ileum is normal. The appendix
is normal. No gross colonic mass. No colonic wall thickening.

Vascular/Lymphatic: There is mild atherosclerotic calcification of
the abdominal aorta without aneurysm. There is no gastrohepatic or
hepatoduodenal ligament lymphadenopathy. No retroperitoneal or
mesenteric lymphadenopathy. No pelvic sidewall lymphadenopathy. Both
common iliac arteries are increased diameter measuring up to 2.0 cm
on the right, stable.

Reproductive: Prostate gland is enlarged.

Other: No intraperitoneal free fluid.

Musculoskeletal: No worrisome lytic or sclerotic osseous
abnormality.
IMPRESSION: 1. The right-sided enhancing bladder mass seen on the previous study
is not well demonstrated today although a right-sided bladder
diverticulum persists, smaller than before.
2. No evidence for definite metastatic disease in the chest,
abdomen, or pelvis.
3. 12 mm nodule identified deep posterior right costophrenic sulcus.
This is in an area of compressive atelectasis and may well be
atelectatic. Follow-up CT recommended in 3 months to re-evaluate.
4. Cholelithiasis.
5. Prostatomegaly.
6. Aortic Atherosclerosis ([id number]).

## 2021-11-27 IMAGING — DX DG CHEST 1V PORT
1 series · 1 of 1 positions shown · non-contrast
Comparison: 08/13/2012

CLINICAL DATA: Syncope

EXAM:
PORTABLE CHEST 1 VIEW

[chest ap]
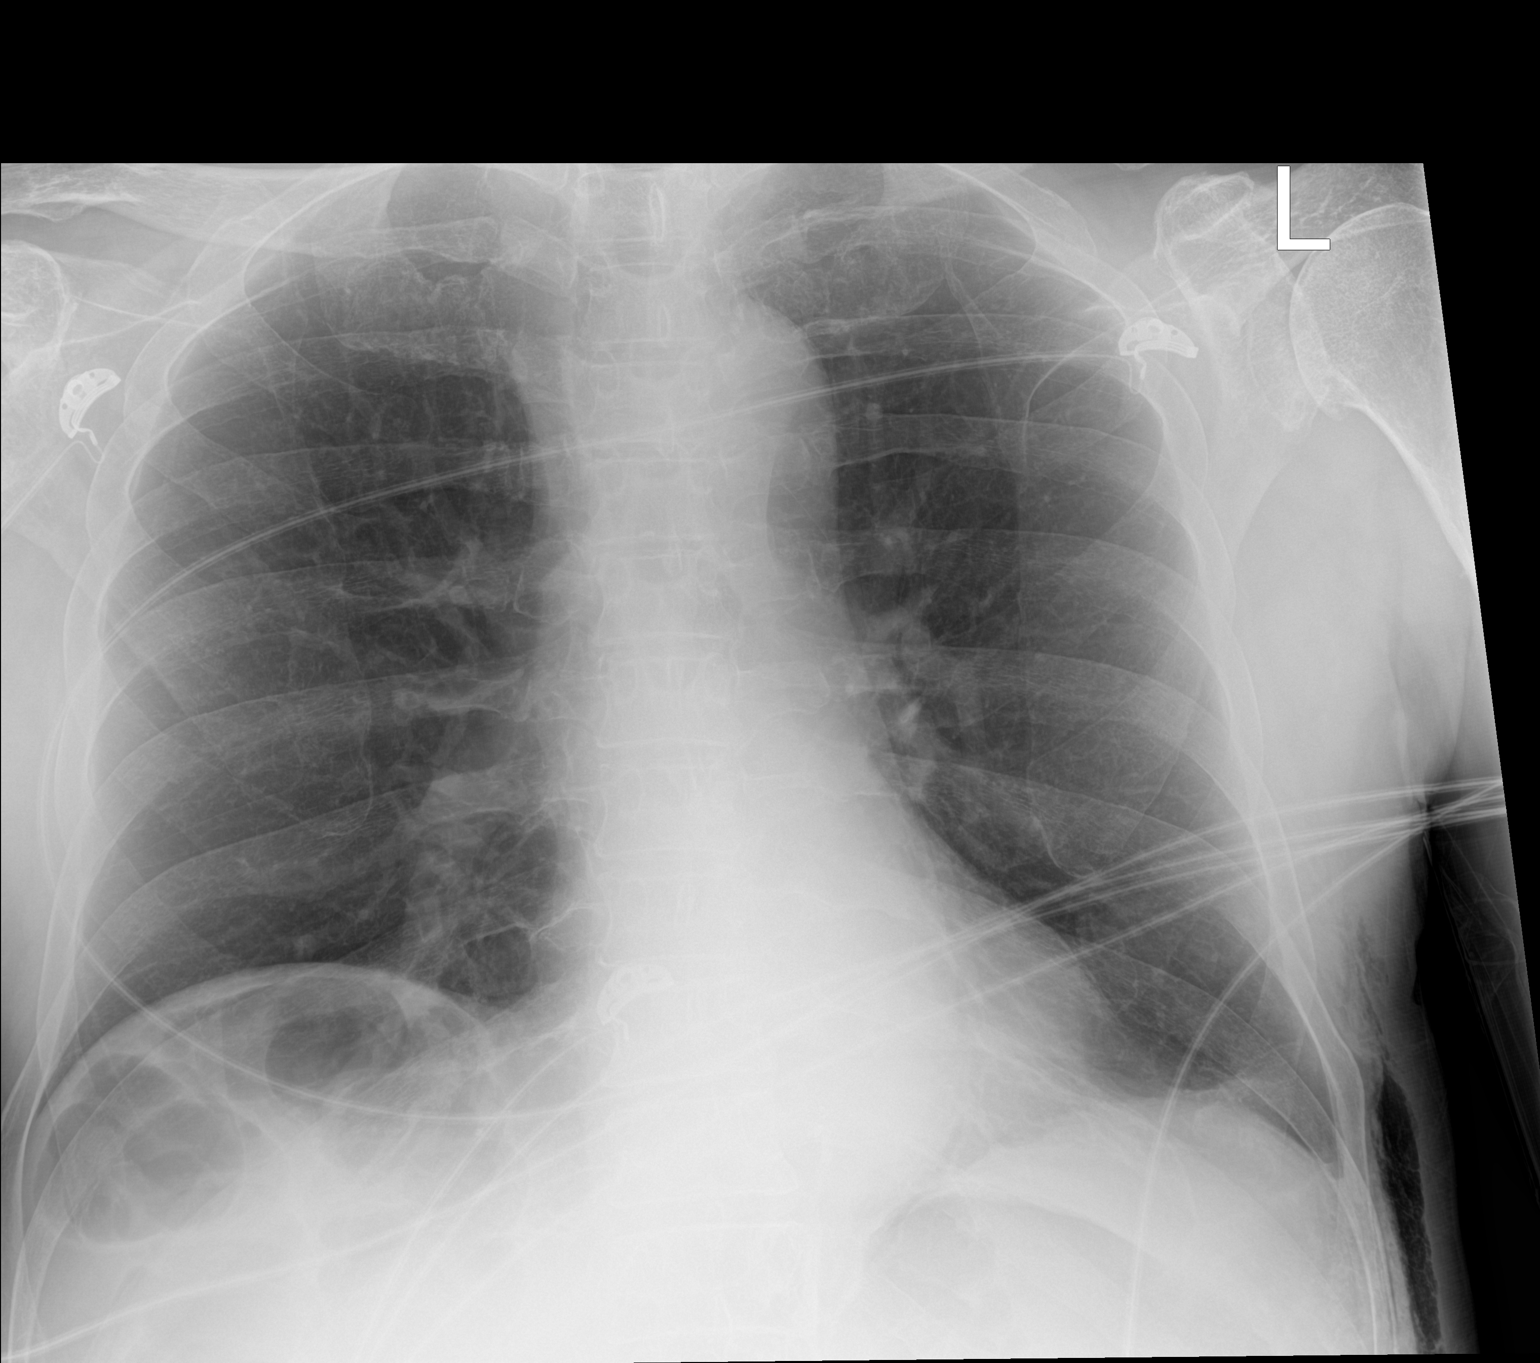

[1 of 1 positions shown; findings below may reference images not displayed]

FINDINGS: The heart size and mediastinal contours are within normal limits.
Both lungs are clear. The visualized skeletal structures are
unremarkable.
IMPRESSION: No active disease.

## 2022-02-07 ENCOUNTER — Other Ambulatory Visit: Payer: Self-pay | Admitting: Nurse Practitioner

## 2022-10-03 ENCOUNTER — Encounter (HOSPITAL_COMMUNITY): Payer: Self-pay

## 2023-03-05 DEATH — deceased

## 2023-07-25 NOTE — Telephone Encounter (Signed)
Telephone call
